# Patient Record
Sex: Female | Born: 1965 | Race: White | Hispanic: No | Marital: Married | Smoking: Never smoker
Health system: Southern US, Community
[De-identification: ages and names within clinical notes are randomized; demographics above are authoritative.]

## PROBLEM LIST (undated history)

## (undated) DIAGNOSIS — F329 Major depressive disorder, single episode, unspecified: Secondary | ICD-10-CM

## (undated) DIAGNOSIS — T7840XA Allergy, unspecified, initial encounter: Secondary | ICD-10-CM

## (undated) DIAGNOSIS — F32A Depression, unspecified: Secondary | ICD-10-CM

## (undated) DIAGNOSIS — R32 Unspecified urinary incontinence: Secondary | ICD-10-CM

## (undated) DIAGNOSIS — I10 Essential (primary) hypertension: Secondary | ICD-10-CM

## (undated) DIAGNOSIS — N390 Urinary tract infection, site not specified: Secondary | ICD-10-CM

## (undated) DIAGNOSIS — B019 Varicella without complication: Secondary | ICD-10-CM

## (undated) HISTORY — DX: Unspecified urinary incontinence: R32

## (undated) HISTORY — PX: ABLATION: SHX5711

## (undated) HISTORY — PX: OTHER SURGICAL HISTORY: SHX169

## (undated) HISTORY — PX: REDUCTION MAMMAPLASTY: SUR839

## (undated) HISTORY — DX: Allergy, unspecified, initial encounter: T78.40XA

## (undated) HISTORY — PX: MASTECTOMY: SHX3

## (undated) HISTORY — DX: Varicella without complication: B01.9

## (undated) HISTORY — DX: Depression, unspecified: F32.A

## (undated) HISTORY — PX: CHOLECYSTECTOMY: SHX55

## (undated) HISTORY — DX: Major depressive disorder, single episode, unspecified: F32.9

## (undated) HISTORY — DX: Urinary tract infection, site not specified: N39.0

---

## 2008-02-06 ENCOUNTER — Ambulatory Visit: Payer: Self-pay | Admitting: Obstetrics and Gynecology

## 2008-02-14 ENCOUNTER — Ambulatory Visit: Payer: Self-pay | Admitting: Obstetrics and Gynecology

## 2008-05-26 ENCOUNTER — Ambulatory Visit: Payer: Self-pay | Admitting: Internal Medicine

## 2009-05-27 ENCOUNTER — Ambulatory Visit: Payer: Self-pay | Admitting: Obstetrics and Gynecology

## 2009-08-27 ENCOUNTER — Ambulatory Visit: Payer: Self-pay | Admitting: Otolaryngology

## 2010-05-24 ENCOUNTER — Ambulatory Visit: Payer: Self-pay | Admitting: Obstetrics and Gynecology

## 2010-12-25 HISTORY — PX: CERVICAL FUSION: SHX112

## 2011-09-05 ENCOUNTER — Ambulatory Visit: Payer: Self-pay | Admitting: Family Medicine

## 2012-08-30 ENCOUNTER — Ambulatory Visit: Payer: Self-pay | Admitting: Physical Medicine and Rehabilitation

## 2012-10-17 ENCOUNTER — Ambulatory Visit: Payer: Self-pay | Admitting: Family Medicine

## 2012-10-17 LAB — URINALYSIS, COMPLETE
Blood: NEGATIVE
Glucose,UR: NEGATIVE mg/dL (ref 0–75)
Ketone: NEGATIVE
Nitrite: NEGATIVE
Ph: 6 (ref 4.5–8.0)
Protein: NEGATIVE

## 2012-10-18 LAB — URINE CULTURE

## 2012-10-22 ENCOUNTER — Ambulatory Visit: Payer: Self-pay | Admitting: Family Medicine

## 2012-12-04 ENCOUNTER — Ambulatory Visit: Payer: Self-pay | Admitting: Obstetrics and Gynecology

## 2013-03-03 ENCOUNTER — Ambulatory Visit: Payer: Self-pay

## 2013-03-03 ENCOUNTER — Encounter: Payer: Self-pay | Admitting: Adult Health

## 2013-03-03 ENCOUNTER — Ambulatory Visit (INDEPENDENT_AMBULATORY_CARE_PROVIDER_SITE_OTHER): Payer: BC Managed Care – PPO | Admitting: Adult Health

## 2013-03-03 VITALS — BP 142/95 | HR 95 | Temp 98.5°F | Resp 14 | Ht 66.0 in | Wt 180.0 lb

## 2013-03-03 DIAGNOSIS — I499 Cardiac arrhythmia, unspecified: Secondary | ICD-10-CM | POA: Insufficient documentation

## 2013-03-03 DIAGNOSIS — R5383 Other fatigue: Secondary | ICD-10-CM

## 2013-03-03 DIAGNOSIS — Z0000 Encounter for general adult medical examination without abnormal findings: Secondary | ICD-10-CM | POA: Insufficient documentation

## 2013-03-03 DIAGNOSIS — R9389 Abnormal findings on diagnostic imaging of other specified body structures: Secondary | ICD-10-CM | POA: Insufficient documentation

## 2013-03-03 DIAGNOSIS — R5381 Other malaise: Secondary | ICD-10-CM

## 2013-03-03 DIAGNOSIS — G479 Sleep disorder, unspecified: Secondary | ICD-10-CM | POA: Insufficient documentation

## 2013-03-03 DIAGNOSIS — R918 Other nonspecific abnormal finding of lung field: Secondary | ICD-10-CM

## 2013-03-03 DIAGNOSIS — Z8639 Personal history of other endocrine, nutritional and metabolic disease: Secondary | ICD-10-CM

## 2013-03-03 HISTORY — DX: Sleep disorder, unspecified: G47.9

## 2013-03-03 HISTORY — DX: Encounter for general adult medical examination without abnormal findings: Z00.00

## 2013-03-03 HISTORY — DX: Abnormal findings on diagnostic imaging of other specified body structures: R93.89

## 2013-03-03 HISTORY — DX: Cardiac arrhythmia, unspecified: I49.9

## 2013-03-03 LAB — LIPID PANEL
HDL: 44.7 mg/dL (ref >39.00)
Triglycerides: 115 mg/dL (ref 0.0–149.0)
VLDL: 23 mg/dL (ref 0.0–40.0)

## 2013-03-03 LAB — CBC WITH DIFFERENTIAL/PLATELET
Basophils Absolute: 0.1 10*3/uL (ref 0.0–0.1)
Basophils Relative: 1.3 % (ref 0.0–3.0)
Eosinophils Absolute: 0.1 10*3/uL (ref 0.0–0.7)
HCT: 40.1 % (ref 36.0–46.0)
Hemoglobin: 13.5 g/dL (ref 12.0–15.0)
Lymphs Abs: 1.3 10*3/uL (ref 0.7–4.0)
MCHC: 33.7 g/dL (ref 30.0–36.0)
MCV: 87.5 fl (ref 78.0–100.0)
Neutro Abs: 2.6 10*3/uL (ref 1.4–7.7)
RBC: 4.58 Mil/uL (ref 3.87–5.11)
RDW: 13.2 % (ref 11.5–14.6)

## 2013-03-03 LAB — COMPREHENSIVE METABOLIC PANEL
ALT: 18 U/L (ref 0–35)
AST: 22 U/L (ref 0–37)
BUN: 12 mg/dL (ref 6–23)
Calcium: 9.5 mg/dL (ref 8.4–10.5)
Chloride: 109 mEq/L (ref 96–112)
Creatinine, Ser: 0.6 mg/dL (ref 0.4–1.2)
GFR: 110.99 mL/min (ref >60.00)
Total Bilirubin: 0.6 mg/dL (ref 0.3–1.2)

## 2013-03-03 MED ORDER — ZOLPIDEM TARTRATE 10 MG PO TABS: 10.0000 mg | ORAL_TABLET | Freq: Every evening | ORAL | Status: DC | PRN

## 2013-03-03 NOTE — Progress Notes (Signed)
Subjective:    Patient ID: Kayla Hood, female    DOB: 1966-11-26, 47 y.o.   MRN: [id number]  HPI  Patient is a pleasant 47 y/o female who presents to clinic this morning to establish care. She was previously followed at [facility] in [place]. She is doing relatively well but has a few concerns today as follows:  1) Patient reports having an abnormal chest xray in November, December 2013. She states that she was treated for pneumonia and her symptoms did subside; however, she did not have a follow up chest xray. She brings the report with her today which shows that there was an abnormality in left perihilar region and infrahilar region posteriorly. This was back in 11/2012. Repeat chest xray was recommended but she has not had one ordered.  2) She feels that her heart is "skipping beats". She reports that this was evaluated back in April or May 2013 and was told that she could be sent to a Cardiologist but that "all that they would do is put you on medication". Pt feels that she was, in essence, told to "live with it". Her symptoms have become more frequent. She is reporting episodes daily. They are associated with feeling chest discomfort, lightheadedness and fatigue. She denies shortness of breath. She reports that her husband tells her that she snores.  3) Patient reports that she has been on ambien for some time now for sleep disturbance. She tried to wean herself off the medication but is still not able to sleep. She would like to resume the [place].   Health Maintenance:  PAP - November 2013 - Normal  Mammogram - January 2014 - Normal  Tetanus - last 10 years - needs Tdap  Labs: - None in ~ 2-3 years  Dental Exam - Yearly (2013)  Vision Exam - Every 2 years. (2012)  Exercise - patient does not exercise  Diet - she follows a healthy diet with fruits, vegetables, lean proteins   Review of Systems  Constitutional: Positive for fatigue. Negative for fever, chills and appetite change.   HENT: Negative.   Eyes: Negative.   Respiratory: Negative for cough, shortness of breath and wheezing.        Husband tells her that she snores  Cardiovascular: Positive for chest pain. Negative for leg swelling.       Skipped beats  Gastrointestinal: Negative.   Endocrine: Negative.   Genitourinary: Positive for dyspareunia. Negative for frequency, hematuria, flank pain, vaginal bleeding, vaginal discharge, difficulty urinating and genital sores.       Vaginal dryness causes dyspaurenia  Musculoskeletal: Negative for joint swelling.       Hx of cortisone injection for left hip bursitis. Pt is s/p cervical spinal sgy 08/2011  Skin: Negative.   Allergic/Immunologic: Positive for environmental allergies.       Seasonal allergies - takes benadryl prn during high allergy seasons.  Neurological: Positive for light-headedness. Negative for dizziness, tremors, syncope, numbness and headaches.  Hematological: Negative.   Psychiatric/Behavioral: Positive for sleep disturbance. Negative for suicidal ideas, behavioral problems and self-injury. The patient is not nervous/anxious.     BP 142/95  Pulse 95  Temp(Src) 98.5 F (36.9 C) (Oral)  Resp 14  Ht 5\' 6"  (1.676 m)  Wt 180 lb (81.647 kg)  BMI 29.07 kg/m2  SpO2 100%     Objective:   Physical Exam  Constitutional: She is oriented to person, place, and time. She appears well-developed and well-nourished. No distress.  HENT:  Head: Normocephalic and  atraumatic.  Right Ear: External ear normal.  Left Ear: External ear normal.  Nose: Nose normal.  Mouth/Throat: Oropharynx is clear and moist.  Eyes: Conjunctivae and EOM are normal. Pupils are equal, round, and reactive to light. No scleral icterus.  Neck: Normal range of motion. Neck supple. No tracheal deviation present. No thyromegaly present.  Cardiovascular: Normal rate, normal heart sounds and intact distal pulses.  Exam reveals no gallop.   No murmur heard. Regular rhythm with  several skipped beats noted.  Pulmonary/Chest: Effort normal and breath sounds normal. No respiratory distress. She has no wheezes. She has no rales. She exhibits no tenderness.  Abdominal: Soft. Bowel sounds are normal. She exhibits no distension and no mass. There is no tenderness. There is no rebound and no guarding.  Genitourinary:  Performed by OB-GYN (2013)  Musculoskeletal: Normal range of motion. She exhibits no edema and no tenderness.  Lymphadenopathy:    She has no cervical adenopathy.  Neurological: She is alert and oriented to person, place, and time. She has normal reflexes. No cranial nerve deficit. She exhibits normal muscle tone. Coordination normal.  Skin: Skin is warm and dry. No rash noted. No erythema.  Psychiatric: She has a normal mood and affect. Her behavior is normal. Judgment and thought content normal.        Assessment & Plan:

## 2013-03-03 NOTE — Assessment & Plan Note (Signed)
Patient had abnormal chest xray in November & December 2013. She reports that, at the time, she was having increased coughing and was treated for pneumonia. No further follow up was done. I will send patient for chest xray. If abnormality persists, will order a chest CT. Remote tobacco use for approximately 7 years in her early 92s.

## 2013-03-03 NOTE — Assessment & Plan Note (Signed)
Normal physical exam except for problems listed below. Labs ordered: cbc w/diff, cmet, lipid, TSH, vit D level (hx of deficiency). Also ordered EKG, chest xray to follow up on previously noted abnormality. Cardiology evaluation.

## 2013-03-03 NOTE — Assessment & Plan Note (Signed)
Patient has been taking ambien for some time now. She tried to wean herself off the medication but reports inability to sleep. Continue ambien at bedtime

## 2013-03-03 NOTE — Assessment & Plan Note (Addendum)
Patient feeling skipped beats which are increasing in frequency. Associated with feeling lightheaded, fatigued. Will check metabolic panel, TSH. Patient also reports that her husband says she snores. I am ordering a sleep study to r/o OSA which may cause irregular heart beat. EKG was done and shows a minimal ST depression in III and aVF. Ordered cardiology evaluation.

## 2013-03-03 NOTE — Patient Instructions (Addendum)
  Thank you for choosing [facility] for your health care needs.  Please have your labs drawn prior to leaving the office.  Please go to our [facility] office for your chest xray.  I have ordering a sleep study to evaluate for sleep apnea given your history of snoring and reports of irregular heartbeat.  I am also referring you to Cardiology - Dr. Mariah Milling or Dr. Kirke Corin. Our office will contact you with an appointment.  I have sent your prescriptions to [facility] in [place].  Remember to activate your MyChart for easy access to your medical records. Below is information on how do activate your account and your activation code.  Once your results are available we will let you know.

## 2013-03-04 LAB — VITAMIN D 25 HYDROXY (VIT D DEFICIENCY, FRACTURES): Vit D, 25-Hydroxy: 34 ng/mL (ref 30–89)

## 2013-03-04 LAB — TSH: TSH: 0.71 u[IU]/mL (ref 0.35–5.50)

## 2013-03-05 ENCOUNTER — Telehealth: Payer: Self-pay | Admitting: Adult Health

## 2013-03-05 ENCOUNTER — Other Ambulatory Visit: Payer: Self-pay | Admitting: Adult Health

## 2013-03-05 ENCOUNTER — Encounter: Payer: Self-pay | Admitting: Adult Health

## 2013-03-05 DIAGNOSIS — E785 Hyperlipidemia, unspecified: Secondary | ICD-10-CM

## 2013-03-05 MED ORDER — ATORVASTATIN CALCIUM 10 MG PO TABS: 10.0000 mg | ORAL_TABLET | Freq: Every day | ORAL | Status: DC

## 2013-03-05 NOTE — Telephone Encounter (Signed)
Chest x-ray normal

## 2013-03-21 ENCOUNTER — Encounter: Payer: Self-pay | Admitting: Adult Health

## 2013-03-21 ENCOUNTER — Encounter: Payer: Self-pay | Admitting: Cardiovascular Disease

## 2013-03-21 ENCOUNTER — Ambulatory Visit (INDEPENDENT_AMBULATORY_CARE_PROVIDER_SITE_OTHER): Payer: BC Managed Care – PPO | Admitting: Cardiovascular Disease

## 2013-03-21 VITALS — BP 155/102 | HR 92 | Ht 67.0 in | Wt 180.2 lb

## 2013-03-21 DIAGNOSIS — R002 Palpitations: Secondary | ICD-10-CM

## 2013-03-21 DIAGNOSIS — I499 Cardiac arrhythmia, unspecified: Secondary | ICD-10-CM

## 2013-03-21 DIAGNOSIS — R0789 Other chest pain: Secondary | ICD-10-CM

## 2013-03-21 HISTORY — DX: Palpitations: R00.2

## 2013-03-21 MED ORDER — METOPROLOL TARTRATE 25 MG PO TABS: 12.5000 mg | ORAL_TABLET | Freq: Two times a day (BID) | ORAL | Status: DC

## 2013-03-21 NOTE — Patient Instructions (Addendum)
Start Metoprolol 25 mg take 1/2 tablet twice daily.   Your physician has requested that you have an echocardiogram. Echocardiography is a painless test that uses sound waves to create images of your heart. It provides your doctor with information about the size and shape of your heart and how well your heart's chambers and valves are working. This procedure takes approximately one hour. There are no restrictions for this procedure.  Follow up in 1 month.

## 2013-03-21 NOTE — Progress Notes (Signed)
HPI  This is a pleasant 47 year old female who was referred by Kayla Hood for evaluation of palpitations. She reports chronic symptoms of skipping in the heart associated with mild chest discomfort and dizziness. This has been intermittent and does not seem to have a specific pattern. It happens at rest and gets better with physical activities. She is highly aware of these skipped beats and becomes very anxious . She was evaluated [facility] cardiology clinic in [place]. She had a Holter monitor done and was told that she had premature beats.  She feels that her symptoms are getting worse. She had recent labs which were normal including thyroid function. She drinks 2 cups of coffee daily. There is no family history of arrhythmia or sudden death. Her father had CABG at the age of 39.   Allergies  Allergen Reactions  . Phenergan (Promethazine Hcl)      Current Outpatient Prescriptions on File Prior to Visit  Medication Sig Dispense Refill  . sertraline (ZOLOFT) 50 MG tablet Take 50 mg by mouth daily.       No current facility-administered medications on file prior to visit.     Past Medical History  Diagnosis Date  . Depression   . Chicken pox   . Allergy   . Incontinence of urine   . UTI (lower urinary tract infection)      Past Surgical History  Procedure Laterality Date  . Cervical fusion  2012  . Pelvic laproscopic surgeries      4 prior to 2008     Family History  Problem Relation Age of Onset  . Hyperlipidemia Mother   . Heart disease Father 10    CAD  . Hypertension Father   . Hypertension Brother   . Cancer Maternal Grandfather 44    Stomach Cancer  . Cancer Paternal Grandmother 32    Uterine Cancer  . Stroke Paternal Grandfather 68    CVA     History   Social History  . Marital Status: Married    Spouse Name: Elky Funches    Number of Children: 1  . Years of Education: 14   Occupational History  . Preschool Teacher for [employer]    Social  History Main Topics  . Smoking status: Former Smoker -- 0.25 packs/day for 5 years    Types: Cigarettes    Quit date: 03/21/2000  . Smokeless tobacco: Never Used  . Alcohol Use: 1.2 oz/week    0 Glasses of wine, 2 Cans of beer per week     Comment: Occassionally   . Drug Use: No  . Sexually Active: Yes   Other Topics Concern  . Not on file   Social History Narrative  . No narrative on file     ROS Constitutional: Negative for fever, chills, diaphoresis, activity change, appetite change and fatigue.  HENT: Negative for hearing loss, nosebleeds, congestion, sore throat, facial swelling, drooling, trouble swallowing, neck pain, voice change, sinus pressure and tinnitus.  Eyes: Negative for photophobia, pain, discharge and visual disturbance.  Respiratory: Negative for apnea, cough and wheezing.  Cardiovascular: Negative for  leg swelling.  Gastrointestinal: Negative for nausea, vomiting, abdominal pain, diarrhea, constipation, blood in stool and abdominal distention.  Genitourinary: Negative for dysuria, urgency, frequency, hematuria and decreased urine volume.  Musculoskeletal: Negative for myalgias, back pain, joint swelling, arthralgias and gait problem.  Skin: Negative for color change, pallor, rash and wound.  Neurological: Negative for dizziness, tremors, seizures, syncope, speech difficulty, weakness, light-headedness,  numbness and headaches.  Psychiatric/Behavioral: Negative for suicidal ideas, hallucinations, behavioral problems and agitation. The patient is not nervous/anxious.     PHYSICAL EXAM   BP 155/102  Pulse 92  Ht 5\' 7"  (1.702 m)  Wt 180 lb 4 oz (81.761 kg)  BMI 28.22 kg/m2 Constitutional: She is oriented to person, place, and time. She appears well-developed and well-nourished. No distress.  HENT: No nasal discharge.  Head: Normocephalic and atraumatic.  Eyes: Pupils are equal and round. Right eye exhibits no discharge. Left eye exhibits no discharge.    Neck: Normal range of motion. Neck supple. No JVD present. No thyromegaly present.  Cardiovascular: Normal rate, regular rhythm, normal heart sounds. Exam reveals no gallop and no friction rub. No murmur heard.  Pulmonary/Chest: Effort normal and breath sounds normal. No stridor. No respiratory distress. She has no wheezes. She has no rales. She exhibits no tenderness.  Abdominal: Soft. Bowel sounds are normal. She exhibits no distension. There is no tenderness. There is no rebound and no guarding.  Musculoskeletal: Normal range of motion. She exhibits no edema and no tenderness.  Neurological: She is alert and oriented to person, place, and time. Coordination normal.  Skin: Skin is warm and dry. No rash noted. She is not diaphoretic. No erythema. No pallor.  Psychiatric: She has a normal mood and affect. Her behavior is normal. Judgment and thought content normal.     EXB:MWUXL  Rhythm  -Right atrial enlargement.   -Nonspecific ST depression  -Nondiagnostic.   ABNORMAL    ASSESSMENT AND PLAN

## 2013-03-21 NOTE — Assessment & Plan Note (Signed)
The patient has frequent palpitations due to what seems to be premature beats. She feels that the symptoms are getting worse. She had a Holter monitor done last year. Recent labs were unremarkable. Due to worsening symptoms, I will obtain an echocardiogram to make sure she has no structural heart abnormalities especially that her ECG is slightly abnormal with minor ST depression in the inferior leads. I reassured her that this condition is not life-threatening but she seems to be very frustrated with her symptoms. I explained to her that the goal of treatment is symptomatic relief. Thus, I will start on small dose metoprolol 12.5 mg twice daily. This hopefully will help also with high blood pressure which seems to be reactive.

## 2013-03-24 ENCOUNTER — Encounter: Payer: Self-pay | Admitting: Adult Health

## 2013-03-31 ENCOUNTER — Encounter: Payer: Self-pay | Admitting: Cardiovascular Disease

## 2013-04-10 ENCOUNTER — Other Ambulatory Visit: Payer: BC Managed Care – PPO

## 2013-04-14 ENCOUNTER — Encounter: Payer: Self-pay | Admitting: *Deleted

## 2013-04-17 ENCOUNTER — Other Ambulatory Visit (INDEPENDENT_AMBULATORY_CARE_PROVIDER_SITE_OTHER): Payer: BC Managed Care – PPO

## 2013-04-17 ENCOUNTER — Other Ambulatory Visit: Payer: Self-pay

## 2013-04-17 DIAGNOSIS — R002 Palpitations: Secondary | ICD-10-CM

## 2013-04-17 DIAGNOSIS — R0789 Other chest pain: Secondary | ICD-10-CM

## 2013-04-17 DIAGNOSIS — I499 Cardiac arrhythmia, unspecified: Secondary | ICD-10-CM

## 2013-04-21 ENCOUNTER — Ambulatory Visit: Payer: BC Managed Care – PPO | Admitting: Cardiovascular Disease

## 2013-04-25 NOTE — Progress Notes (Signed)
Informed pt of echo results

## 2013-05-11 ENCOUNTER — Encounter: Payer: Self-pay | Admitting: Adult Health

## 2013-05-21 ENCOUNTER — Encounter: Payer: Self-pay | Admitting: Adult Health

## 2013-05-23 ENCOUNTER — Encounter: Payer: Self-pay | Admitting: Adult Health

## 2013-06-26 ENCOUNTER — Ambulatory Visit (INDEPENDENT_AMBULATORY_CARE_PROVIDER_SITE_OTHER): Payer: BC Managed Care – PPO | Admitting: Adult Health

## 2013-06-26 ENCOUNTER — Ambulatory Visit (INDEPENDENT_AMBULATORY_CARE_PROVIDER_SITE_OTHER)
Admission: RE | Admit: 2013-06-26 | Discharge: 2013-06-26 | Disposition: A | Discharge status: Home / Self Care | Payer: BC Managed Care – PPO | Source: Ambulatory Visit | Attending: Adult Health | Admitting: Adult Health

## 2013-06-26 ENCOUNTER — Encounter: Payer: Self-pay | Admitting: Adult Health

## 2013-06-26 VITALS — BP 128/76 | HR 93 | Temp 98.9°F | Resp 12 | Wt 176.5 lb

## 2013-06-26 DIAGNOSIS — M25559 Pain in unspecified hip: Secondary | ICD-10-CM

## 2013-06-26 DIAGNOSIS — M25552 Pain in left hip: Secondary | ICD-10-CM

## 2013-06-26 MED ORDER — MELOXICAM 7.5 MG PO TABS: 7.5000 mg | ORAL_TABLET | Freq: Every day | ORAL | Status: DC

## 2013-06-26 NOTE — Progress Notes (Signed)
  Subjective:    Patient ID: Kayla Hood, female    DOB: Dec 07, 1966, 47 y.o.   MRN: [id number]  HPI  Patient is a pleasant 47 year old female who presents to clinic with left hip pain. The pain has been ongoing for greater than one month. 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 Patient reports having a history of bursitis in the left trochanter; however, this pain feels slightly different. Her pain improves with rest.   Current Outpatient Prescriptions on File Prior to Visit  Medication Sig Dispense Refill  . ibuprofen (ADVIL,MOTRIN) 600 MG tablet Take 600 mg by mouth every 6 (six) hours as needed for pain.      . Multiple Vitamin (MULTIVITAMIN) tablet Take 1 tablet by mouth daily.      . sertraline (ZOLOFT) 50 MG tablet Take 50 mg by mouth daily.      Marland Kitchen zolpidem (AMBIEN) 10 MG tablet Take 5 mg by mouth at bedtime.       No current facility-administered medications on file prior to visit.    Review of Systems  Constitutional: Negative.   Musculoskeletal: Negative for back pain and joint swelling.       Left anterior hip pain  Neurological: Negative for numbness.       Objective:   Physical Exam  Constitutional: She is oriented to person, place, and time. She appears well-developed and well-nourished. No distress.  Cardiovascular: Normal rate and regular rhythm.   Pulmonary/Chest: Effort normal. No respiratory distress.  Musculoskeletal: Normal range of motion. She exhibits tenderness. She exhibits no edema.  There is no point tenderness at the greater trochanter. Patient has good range of motion with abduction and adduction of the hip. There is point tenderness over left anterior hip  Neurological: She is alert and oriented to person, place, and time. Coordination normal.  Psychiatric: She has a normal mood and affect. Her behavior is normal. Judgment and thought content normal.          Assessment & Plan:

## 2013-06-28 ENCOUNTER — Encounter: Payer: Self-pay | Admitting: Adult Health

## 2013-06-28 DIAGNOSIS — M25552 Pain in left hip: Secondary | ICD-10-CM

## 2013-06-28 HISTORY — DX: Pain in left hip: M25.552

## 2013-06-28 NOTE — Assessment & Plan Note (Addendum)
Suspect this is tendinitis involving the iliopsoas tendon. Send for left hip x-ray to rule out any structural abnormalities. Start anti-inflammatories such as ibuprofen 600 mg every 6 hours for the next 3-4 days, ice for 15 minutes 3-4 times a day for the next 3-4 days and rest. Once inflammation subsides recommendation is to resume gradual activity with strengthening of the area such warmup, stretching, or exercises etc.

## 2013-07-12 ENCOUNTER — Ambulatory Visit: Payer: Self-pay | Admitting: Family Medicine

## 2013-07-12 LAB — URINALYSIS, COMPLETE
Bacteria: NEGATIVE
Bilirubin,UR: NEGATIVE
Glucose,UR: NEGATIVE mg/dL (ref 0–75)
Ketone: NEGATIVE
Nitrite: NEGATIVE
Ph: 6.5 (ref 4.5–8.0)
Protein: NEGATIVE
Specific Gravity: 1.01 (ref 1.003–1.030)
WBC UR: NONE SEEN /HPF (ref 0–5)

## 2013-07-12 LAB — PREGNANCY, URINE: Pregnancy Test, Urine: NEGATIVE m[IU]/mL

## 2013-08-27 ENCOUNTER — Ambulatory Visit: Payer: BC Managed Care – PPO | Admitting: Adult Health

## 2013-08-27 ENCOUNTER — Ambulatory Visit: Payer: BC Managed Care – PPO | Admitting: Internal Medicine

## 2013-09-01 ENCOUNTER — Other Ambulatory Visit: Payer: Self-pay | Admitting: Adult Health

## 2013-09-01 ENCOUNTER — Ambulatory Visit (INDEPENDENT_AMBULATORY_CARE_PROVIDER_SITE_OTHER): Payer: BC Managed Care – PPO | Admitting: Adult Health

## 2013-09-01 ENCOUNTER — Ambulatory Visit (INDEPENDENT_AMBULATORY_CARE_PROVIDER_SITE_OTHER)
Admission: RE | Admit: 2013-09-01 | Discharge: 2013-09-01 | Disposition: A | Discharge status: Home / Self Care | Payer: BC Managed Care – PPO | Source: Ambulatory Visit | Attending: Adult Health | Admitting: Adult Health

## 2013-09-01 ENCOUNTER — Encounter: Payer: Self-pay | Admitting: Adult Health

## 2013-09-01 VITALS — BP 130/82 | HR 78 | Temp 98.7°F | Resp 12 | Ht 67.0 in | Wt 180.0 lb

## 2013-09-01 DIAGNOSIS — R52 Pain, unspecified: Secondary | ICD-10-CM

## 2013-09-01 DIAGNOSIS — M25512 Pain in left shoulder: Secondary | ICD-10-CM

## 2013-09-01 DIAGNOSIS — M778 Other enthesopathies, not elsewhere classified: Secondary | ICD-10-CM

## 2013-09-01 DIAGNOSIS — M67919 Unspecified disorder of synovium and tendon, unspecified shoulder: Secondary | ICD-10-CM

## 2013-09-01 HISTORY — DX: Other enthesopathies, not elsewhere classified: M77.8

## 2013-09-01 MED ORDER — SERTRALINE HCL 50 MG PO TABS: 50.0000 mg | ORAL_TABLET | Freq: Every day | ORAL | Status: DC

## 2013-09-01 MED ORDER — ZOLPIDEM TARTRATE 10 MG PO TABS: 5.0000 mg | ORAL_TABLET | Freq: Every day | ORAL | Status: DC

## 2013-09-01 NOTE — Progress Notes (Signed)
  Subjective:    Patient ID: Kayla Hood, female    DOB: [birth year], 47 y.o.   MRN: [id number]  HPI  Patient presents left shoulder, arm pain. First injured area in July while gardening. She lost her balance and moved in the wrong way to prevent a fall when she felt a pulling sensation in the left arm. Now she is also feeling some numbness and tingling which appears to be positional. She has been slowly exercising the area and preventing it from stiffening. She has some decreased ROM in the left shoulder joint. Meygan has been taking ibuprofen with some relief. Her pain is not constant. It occurs with mobility. She has not iced the area nor applied any heat.   Current Outpatient Prescriptions on File Prior to Visit  Medication Sig Dispense Refill  . metoprolol tartrate (LOPRESSOR) 25 MG tablet Take 25 mg by mouth as needed.       No current facility-administered medications on file prior to visit.    Review of Systems  Constitutional: Negative.   HENT: Negative for neck pain.   Respiratory: Negative.   Cardiovascular: Negative.   Musculoskeletal:       Left shoulder and arm pain with movement s/p injury during gardening.  Neurological: Positive for numbness.       Numbness and tingling of fingers on left hand with movement of arm   BP 130/82  Pulse 78  Temp(Src) 98.7 F (37.1 C) (Oral)  Resp 12  Ht 5\' 7"  (1.702 m)  Wt 180 lb (81.647 kg)  BMI 28.19 kg/m2  SpO2 98%     Objective:   Physical Exam  Constitutional: She is oriented to person, place, and time. She appears well-developed and well-nourished. No distress.  Cardiovascular: Normal rate and regular rhythm.   Pulmonary/Chest: Effort normal. No respiratory distress.  Musculoskeletal: She exhibits tenderness. She exhibits no edema.  Decreased ROM of left shoulder. No crepitus. Pain exhibited with palpation of bicep brachii. Pain exhibited with abduction of arm.  Neurological: She is alert and oriented to person, place, and  time. Coordination normal.  Skin: Skin is warm and dry.  Psychiatric: She has a normal mood and affect. Her behavior is normal. Judgment and thought content normal.       Assessment & Plan:

## 2013-09-01 NOTE — Patient Instructions (Addendum)
   Apply ice alternating with heat to the affected areas for 15 min at a time. Do this approximately 3-4 times daily.  Immobilize the area for rest. You can use a sling.  I am sending you for an xray of the shoulder and arm. Once I get the results I will send them through MyChart.  I have also [facility] in [facility] [place]. I will give you are prescription for ambien to take to your pharmacy.  Return to clinic if your symptoms are not improving within 4 weeks or sooner if your symptoms worsen.

## 2013-09-01 NOTE — Assessment & Plan Note (Addendum)
Symptoms are consistent with tendonitis of the left suprapinatus tendon. She exhibits pain with abduction of the arm. Xray left shoulder and humerus to r/o any bony abnormality. If normal will refer to ortho. Apply ice 3-4 times daily. May immobilize during the night with a sling since she reports pain with movement. Continue anti-inflammatory medication such as ibuprofen.

## 2013-09-02 ENCOUNTER — Telehealth: Payer: Self-pay | Admitting: Emergency Medicine

## 2013-09-10 ENCOUNTER — Encounter: Payer: Self-pay | Admitting: Family Medicine

## 2013-09-10 ENCOUNTER — Ambulatory Visit (INDEPENDENT_AMBULATORY_CARE_PROVIDER_SITE_OTHER): Payer: BC Managed Care – PPO | Admitting: Family Medicine

## 2013-09-10 VITALS — BP 148/90 | HR 73 | Temp 98.7°F | Ht 67.0 in | Wt 180.2 lb

## 2013-09-10 DIAGNOSIS — M7502 Adhesive capsulitis of left shoulder: Secondary | ICD-10-CM

## 2013-09-10 DIAGNOSIS — M7500 Adhesive capsulitis of unspecified shoulder: Secondary | ICD-10-CM

## 2013-09-10 DIAGNOSIS — M67919 Unspecified disorder of synovium and tendon, unspecified shoulder: Secondary | ICD-10-CM

## 2013-09-10 DIAGNOSIS — M7582 Other shoulder lesions, left shoulder: Secondary | ICD-10-CM

## 2013-09-10 NOTE — Progress Notes (Signed)
Date:  09/10/2013   Name:  Kayla Hood   DOB:  [birth year]   MRN:  [id number] Gender: female Age: 47 y.o.  Primary Physician: Orville Govern, NP  Dear Dr. Hassie Bruce,  Thank you for having me see Kayla Hood in consultation today at [facility] at [facility] for her problem with LEFT shoulder pain.  As you may recall, she is a 47 y.o. year old female with a history of LEFT shoulder pain and a history of cervical spine fusion in 2012 who injured her left shoulder in July while she was gardening. At that time, she lost her balance and as she was moving used her hand to brace her fall and felt some pain in her left shoulder. At that point, most of her pain was lateral. Now, she complains a lot of anterior and posterior pain.  This morning, she felt a lot of pain again in the left shoulder. She also has a dull lateral ache and pain with some numbness more in a t-shirt distribution on the left. No significant neck pain right now.   She has significant loss of motion, and she is not sure when that started.    Past Medical History  Diagnosis Date  . Depression   . Chicken pox   . Allergy   . Incontinence of urine   . UTI (lower urinary tract infection)     Past Surgical History  Procedure Laterality Date  . Cervical fusion  2012  . Pelvic laproscopic surgeries      4 prior to 2008    History   Social History  . Marital Status: Married    Spouse Name: Sherre Wooton    Number of Children: 1  . Years of Education: 14   Occupational History  . Preschool Teacher for [employer]    Social History Main Topics  . Smoking status: Former Smoker -- 0.25 packs/day for 5 years    Types: Cigarettes    Quit date: 03/21/2000  . Smokeless tobacco: Never Used  . Alcohol Use: 1.2 oz/week    0 Glasses of wine, 2 Cans of beer per week     Comment: Occassionally   . Drug Use: No  . Sexual Activity: Yes   Other Topics Concern  . None   Social History Narrative  . None    Family  History  Problem Relation Age of Onset  . Hyperlipidemia Mother   . Heart disease Father 31    CAD  . Hypertension Father   . Hypertension Brother   . Cancer Maternal Grandfather 44    Stomach Cancer  . Cancer Paternal Grandmother 41    Uterine Cancer  . Stroke Paternal Grandfather 49    CVA    Medications and Allergies reviewed  Outpatient Prescriptions Prior to Visit  Medication Sig Dispense Refill  . metoprolol tartrate (LOPRESSOR) 25 MG tablet Take 25 mg by mouth as needed.      . sertraline (ZOLOFT) 50 MG tablet Take 1 tablet (50 mg total) by mouth daily.  30 tablet  6  . zolpidem (AMBIEN) 10 MG tablet Take 0.5 tablets (5 mg total) by mouth at bedtime.  30 tablet  5   No facility-administered medications prior to visit.    Review of Systems:    GEN: No fevers, chills. Nontoxic. Primarily MSK c/o today. MSK: Detailed in the HPI GI: tolerating PO intake without difficulty Neuro: detailed above Otherwise the pertinent positives of the ROS are noted above.  Physical Examination: Filed Vitals:   09/10/13 1121  BP: 148/90  Pulse: 73  Temp: 98.7 F (37.1 C)  TempSrc: Oral  Height: 5\' 7"  (1.702 m)  Weight: 180 lb 4 oz (81.761 kg)      GEN: Well-developed,well-nourished,in no acute distress; alert,appropriate and cooperative throughout examination HEENT: Normocephalic and atraumatic without obvious abnormalities. Ears, externally no deformities PULM: Breathing comfortably in no respiratory distress EXT: No clubbing, cyanosis, or edema PSYCH: Normally interactive. Cooperative during the interview. Pleasant. Friendly and conversant. Not anxious or depressed appearing. Normal, full affect.  Shoulder: L Inspection: No muscle wasting or winging Ecchymosis/edema: neg  AC joint, scapula, clavicle: NT Cervical spine: NT, full ROM Spurling's: neg Abduction: abd to 155, 5/5 Flexion: flex to 165, 5/5 IR, at 90 deg abd, loss of 60 deg compared to R side, lift-off:  5/5 ER at neutral: full, 5/5 AC crossover: neg Neer: mild pos Hawkins: mild pos Drop Test: neg Empty Can: neg Supraspinatus insertion: NT Bicipital groove: NT Speed's: neg Yergason's: neg Sulcus sign: neg Scapular dyskinesis: none C5-T1 intact  Neuro: Sensation intact Grip 5/5   Dg Shoulder Left  09/01/2013   *RADIOLOGY REPORT*  Clinical Data: Left shoulder pain, numbness and tingling in fingers  LEFT SHOULDER - 2+ VIEW  Comparison: None  Findings: Low normal osseous mineralization. AC joint alignment normal. No acute fracture, dislocation, or bone destruction. Visualized left ribs intact.  IMPRESSION: No acute osseous abnormalities.   Original Report Authenticated By: Ulyses Southward, M.D.  Independently reviewed, no significant oa or impingement anatomy.  Hannah Beat, MD 09/14/2013, 4:08 PM   Dg Humerus Left  09/01/2013   *RADIOLOGY REPORT*  Clinical Data:  Left shoulder pain  LEFT HUMERUS - 2+ VIEW  Comparison: None  Findings: AC joint alignment normal. Shoulder and elbow joint alignments grossly normal. No definite fracture, dislocation or bone destruction.  IMPRESSION: Normal exam.   Original Report Authenticated By: Ulyses Southward, M.D.    Assessment and Plan:  Impression: 1. Likely initial minor RTC partial tear in July, now with functional impingement and tendinopathy of RTC, but relatively minor. 2. 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   Recommendations: Aggressive PT and HEP. Intraarticular steroid injection. Given [facility] adhesive capsulitis program.  Intrarticular Shoulder Injection, LEFT Verbal consent was obtained from the patient. Risks including infection explained and contrasted with benefits and alternatives. Patient prepped with Chloraprep and Ethyl Chloride used for anesthesia. An intraarticular shoulder injection was performed using the posterior approach. The patient tolerated the procedure well and had decreased  pain post injection. No complications. Injection: 8 cc of Lidocaine 1% and 2 cc of Depo-Medrol 40 mg. Needle: 22 gauge   We will see the patient back in 6 weeks.  Thank you for having [place] see Kayla Hood in consultation.  Feel free to contact me with any questions.  Signed, Elpidio Galea. [name] Shankle, MD, CAQ Sports Medicine [employer] at [facility] [address] [place], [place] [address] Phone: [phone] Fax: [phone]

## 2013-10-20 ENCOUNTER — Ambulatory Visit: Payer: BC Managed Care – PPO | Admitting: Family Medicine

## 2013-10-23 ENCOUNTER — Encounter: Payer: Self-pay | Admitting: *Deleted

## 2013-10-24 ENCOUNTER — Ambulatory Visit: Payer: BC Managed Care – PPO | Admitting: Adult Health

## 2013-10-27 ENCOUNTER — Encounter: Payer: Self-pay | Admitting: Family Medicine

## 2013-10-27 ENCOUNTER — Ambulatory Visit (INDEPENDENT_AMBULATORY_CARE_PROVIDER_SITE_OTHER): Payer: BC Managed Care – PPO | Admitting: Family Medicine

## 2013-10-27 VITALS — BP 124/80 | HR 85 | Temp 98.4°F | Ht 67.0 in | Wt 182.0 lb

## 2013-10-27 DIAGNOSIS — M25519 Pain in unspecified shoulder: Secondary | ICD-10-CM

## 2013-10-27 DIAGNOSIS — M25512 Pain in left shoulder: Secondary | ICD-10-CM

## 2013-10-27 NOTE — Patient Instructions (Signed)
REFERRAL: GO THE THE FRONT ROOM AT THE ENTRANCE OF OUR CLINIC, NEAR CHECK IN. ASK FOR Kayla Hood. SHE WILL HELP YOU SET UP YOUR REFERRAL. DATE: TIME:  

## 2013-10-27 NOTE — Progress Notes (Signed)
Date:  10/27/2013   Name:  Kayla Hood   DOB:  Oct 19, 1966   MRN:  [id number] Gender: female Age: 47 y.o.  Primary Physician:  Orville Govern, NP   Chief Complaint: Follow-up   History of Present Illness:  Kayla Hood is a 47 y.o. very pleasant female patient who presents with the following:  F/u Left shoulder: The patient's last office visit we did a shoulder injection, intra-articular. She had some relief of pain and improved range of motion from about 2 weeks, she is some continued to have some significant loss of motion. She also feels pain and loss of strength in the point of abduction. She also has pain at nighttime and significant waking her up. She does have a history of injury on the left shoulder as described in July of 2014.  09/10/2013 As you may recall, she is a 47 y.o. year old female with a history of LEFT shoulder pain and a history of cervical spine fusion in 2012 who injured her left shoulder in July while she was gardening. At that time, she lost her balance and as she was moving used her hand to brace her fall and felt some pain in her left shoulder. At that point, most of her pain was lateral. Now, she complains a lot of anterior and posterior pain.  This morning, she felt a lot of pain again in the left shoulder. She also has a dull lateral ache and pain with some numbness more in a t-shirt distribution on the left. No significant neck pain right now.   She has significant loss of motion, and she is not sure when that started.     Past Medical History, Surgical History, Social History, Family History, Problem List, Medications, and Allergies have been reviewed and updated if relevant.  Review of Systems:  GEN: No fevers, chills. Nontoxic. Primarily MSK c/o today. MSK: Detailed in the HPI GI: tolerating PO intake without difficulty Neuro: No numbness, parasthesias, or tingling associated. Otherwise the pertinent positives of the ROS are noted above.    Physical  Examination: BP 124/80  Pulse 85  Temp(Src) 98.4 F (36.9 C) (Oral)  Ht 5\' 7"  (1.702 m)  Wt 182 lb (82.555 kg)  BMI 28.50 kg/m2   GEN: Well-developed,well-nourished,in no acute distress; alert,appropriate and cooperative throughout examination HEENT: Normocephalic and atraumatic without obvious abnormalities. Ears, externally no deformities PULM: Breathing comfortably in no respiratory distress EXT: No clubbing, cyanosis, or edema PSYCH: Normally interactive. Cooperative during the interview. Pleasant. Friendly and conversant. Not anxious or depressed appearing. Normal, full affect.  Shoulder: L Inspection: No muscle wasting or winging Ecchymosis/edema: neg  AC joint, scapula, clavicle: NT Cervical spine: NT, full ROM Spurling's: neg Abduction: abd to 160, 4+/5 Flexion: flex to 165, 5/5 IR, at 90 deg abd, loss of 60 deg compared to R side, lift-off: 5/5 ER at neutral: loss of 50 deg, 5/5 AC crossover: neg Neer: mild pos Hawkins: mild pos Drop Test: neg Empty Can: neg Supraspinatus insertion: NT Bicipital groove: NT Speed's: neg Yergason's: neg Sulcus sign: neg Scapular dyskinesis: none C5-T1 intact  Neuro: Sensation intact Grip 5/5   Dg Shoulder Left  09/01/2013   *RADIOLOGY REPORT*  Clinical Data: Left shoulder pain, numbness and tingling in fingers  LEFT SHOULDER - 2+ VIEW  Comparison: None  Findings: Low normal osseous mineralization. AC joint alignment normal. No acute fracture, dislocation, or bone destruction. Visualized left ribs intact.  IMPRESSION: No acute osseous abnormalities.   Original Report Authenticated By: Loraine Leriche  Tyron Russell, M.D.  Independently reviewed, no significant oa or impingement anatomy.  Hannah Beat, MD 09/14/2013, 4:08 PM   Dg Humerus Left  09/01/2013   *RADIOLOGY REPORT*  Clinical Data:  Left shoulder pain  LEFT HUMERUS - 2+ VIEW  Comparison: None  Findings: AC joint alignment normal. Shoulder and elbow joint alignments grossly normal. No  definite fracture, dislocation or bone destruction.  IMPRESSION: Normal exam.   Original Report Authenticated By: Ulyses Southward, M.D.    Assessment and Plan:  Left shoulder pain - Plan: MR Shoulder Left Wo Contrast  Minimal improvement over 6 weeks of dedicated home exercise program through [facility] is frozen shoulder program, and status post intra-articular injection times one and multiple weeks of NSAIDs.  Obtain an MRI of the left shoulder to evaluate to rule out potential rotator cuff tear. In a young patient, she may have sustained a rotator cuff tear in July at the time of her initial injury. This will help determine plan of care.  Patient Instructions  REFERRAL: GO THE THE FRONT ROOM AT THE ENTRANCE OF OUR CLINIC, NEAR CHECK IN. ASK FOR MARION. SHE WILL HELP YOU SET UP YOUR REFERRAL. DATE: TIME:    Orders Today:  Orders Placed This Encounter  Procedures  . MR Shoulder Left Wo Contrast    Standing Status: Future     Number of Occurrences:      Standing Expiration Date: 12/27/2014    Order Specific Question:  Reason for Exam (SYMPTOM  OR DIAGNOSIS REQUIRED)    Answer:  [facility] - OPEN MRI, Left shoulder, eval for RTC tear    Order Specific Question:  Is the patient pregnant?    Answer:  No    Order Specific Question:  Preferred imaging location?    Answer:  External    Order Specific Question:  Does the patient have a pacemaker, internal devices, implants, aneury    Answer:  No    New medications, updates to list, dose adjustments: No orders of the defined types were placed in this encounter.    Signed,  Elpidio Galea. Deloros Beretta, MD, CAQ Sports Medicine  [facility] at [facility] [address] [place] [address] Phone: [phone] Fax: [phone]  Updated Complete Medication List:   Medication List       This list is accurate as of: 10/27/13  4:24 PM.  Always use your most recent med list.               acetaminophen 325 MG tablet  Commonly known as:   TYLENOL  Take 650 mg by mouth every 6 (six) hours as needed for pain.     ibuprofen 200 MG tablet  Commonly known as:  ADVIL,MOTRIN  Take 600 mg by mouth every 6 (six) hours as needed for pain.     metoprolol tartrate 25 MG tablet  Commonly known as:  LOPRESSOR  Take 25 mg by mouth as needed.     sertraline 50 MG tablet  Commonly known as:  ZOLOFT  Take 1 tablet (50 mg total) by mouth daily.     zolpidem 10 MG tablet  Commonly known as:  AMBIEN  Take 0.5 tablets (5 mg total) by mouth at bedtime.

## 2013-10-30 ENCOUNTER — Other Ambulatory Visit: Payer: Self-pay

## 2013-11-05 ENCOUNTER — Ambulatory Visit: Payer: Self-pay | Admitting: Family Medicine

## 2013-11-06 ENCOUNTER — Telehealth: Payer: Self-pay

## 2013-11-06 ENCOUNTER — Encounter: Payer: Self-pay | Admitting: Family Medicine

## 2013-11-06 NOTE — Telephone Encounter (Signed)
1. Partial tear infraspinatus (part of rotator cuff) 2.  Small labral tear 3. Clinically, Frozen Shoulder Recommendation at this point would be orthopedic surgery referral:  Dr. Eulis Foster [facility]) or Dr. Ave Filter [facility])  Left message for patient to return my call.

## 2013-11-06 NOTE — Telephone Encounter (Signed)
Pt left v/m; pt had MRI done on 11/05/13 and hopes to hear about results before weekend.Please advise.

## 2013-11-06 NOTE — Telephone Encounter (Signed)
To Donna

## 2013-11-06 NOTE — Telephone Encounter (Signed)
Kayla Hood, can you help me track down this MRI. It is not in chart. May have to call [facility]

## 2013-11-06 NOTE — Telephone Encounter (Signed)
MRI results requested from [facility]. 

## 2013-11-06 NOTE — Telephone Encounter (Signed)
Sent again

## 2013-11-06 NOTE — Telephone Encounter (Signed)
MRI results requested from [facility].

## 2013-11-07 NOTE — Telephone Encounter (Signed)
Patient notified as instructed by telephone.  Would like to be referred to Dr. Juanell Fairly in [place].  Also would like a copy of her results mailed to her.  Will forward to Dr. Patsy Lager to order the referral.  Copy of MRI results mailed to patient.

## 2013-11-11 ENCOUNTER — Encounter: Payer: Self-pay | Admitting: *Deleted

## 2013-11-12 ENCOUNTER — Other Ambulatory Visit: Payer: Self-pay | Admitting: Family Medicine

## 2013-11-12 ENCOUNTER — Ambulatory Visit: Payer: BC Managed Care – PPO | Admitting: Adult Health

## 2013-11-12 DIAGNOSIS — M25512 Pain in left shoulder: Secondary | ICD-10-CM

## 2013-11-12 DIAGNOSIS — M75102 Unspecified rotator cuff tear or rupture of left shoulder, not specified as traumatic: Secondary | ICD-10-CM

## 2013-11-24 DIAGNOSIS — Z7721 Contact with and (suspected) exposure to potentially hazardous body fluids: Secondary | ICD-10-CM

## 2013-11-24 DIAGNOSIS — T148XXA Other injury of unspecified body region, initial encounter: Secondary | ICD-10-CM

## 2013-11-24 HISTORY — DX: Contact with and (suspected) exposure to potentially hazardous body fluids: Z77.21

## 2013-11-24 HISTORY — DX: Other injury of unspecified body region, initial encounter: T14.8XXA

## 2014-01-26 ENCOUNTER — Ambulatory Visit: Payer: BC Managed Care – PPO | Admitting: Adult Health

## 2014-01-26 ENCOUNTER — Ambulatory Visit (INDEPENDENT_AMBULATORY_CARE_PROVIDER_SITE_OTHER): Payer: BC Managed Care – PPO | Admitting: Adult Health

## 2014-01-26 ENCOUNTER — Encounter: Payer: Self-pay | Admitting: Adult Health

## 2014-01-26 VITALS — BP 142/88 | HR 73 | Temp 98.4°F | Resp 12 | Wt 180.0 lb

## 2014-01-26 DIAGNOSIS — R1013 Epigastric pain: Secondary | ICD-10-CM

## 2014-01-26 DIAGNOSIS — R1031 Right lower quadrant pain: Secondary | ICD-10-CM

## 2014-01-26 DIAGNOSIS — L509 Urticaria, unspecified: Secondary | ICD-10-CM

## 2014-01-26 HISTORY — DX: Right lower quadrant pain: R10.31

## 2014-01-26 HISTORY — DX: Urticaria, unspecified: L50.9

## 2014-01-26 NOTE — Progress Notes (Signed)
Pre visit review using our clinic review tool, if applicable. No additional management support is needed unless otherwise documented below in the visit note. 

## 2014-01-26 NOTE — Patient Instructions (Signed)
  For the hives, try zyrtec 10 mg daily.  We will try this for 2 weeks. If no improvement we will send you to an allergist for complete testing.  For your abdominal pain - I am sending you for CT of the abdomen and pelvis.  Also, please have your labs drawn prior to leaving the office.

## 2014-01-26 NOTE — Assessment & Plan Note (Signed)
Uncertain etiology. Start Zyrtec nightly. If no improvement in 2 weeks will refer to Allergist.

## 2014-01-26 NOTE — Progress Notes (Signed)
   Subjective:    Patient ID: Kayla Hood, female    DOB: [birth year], 48 y.o.   MRN: [id number]  HPI  Kayla Hood is a pleasant 48 y/o female who presents to clinic with c/o ongoing daily hives which occur in the afternoon or evening. Symptoms started approximately 2 weeks ago. They burn/itching. She has taken benadryl with good results; however, she does not like the feeling she gets from benadryl. No new lotions, soaps, detergents, cleaning agents. No new medications.  She is experiencing epigastric discomfort that is waking her up. Symptoms started approximately 2 weeks ago. Relieved by Zantac. She also reports the pain is worse after a heavy meal. Radiates at times around to her back on the right and up toward her shoulder. Nausea but not severe enough to make her vomit.   Current Outpatient Prescriptions on File Prior to Visit  Medication Sig Dispense Refill  . acetaminophen (TYLENOL) 325 MG tablet Take 650 mg by mouth every 6 (six) hours as needed for pain.      Marland Kitchen ibuprofen (ADVIL,MOTRIN) 200 MG tablet Take 600 mg by mouth every 6 (six) hours as needed for pain.      Marland Kitchen zolpidem (AMBIEN) 10 MG tablet Take 0.5 tablets (5 mg total) by mouth at bedtime.  30 tablet  5   No current facility-administered medications on file prior to visit.    Review of Systems  Constitutional: Negative for fever and chills.  Respiratory: Negative.   Cardiovascular: Negative.   Gastrointestinal: Positive for nausea and abdominal pain. Negative for vomiting, diarrhea, constipation, blood in stool and abdominal distention.  Skin:       Hives  Psychiatric/Behavioral: Negative.   All other systems reviewed and are negative.       Objective:   Physical Exam  Constitutional: She is oriented to person, place, and time. She appears well-developed and well-nourished. No distress.  Cardiovascular: Normal rate and regular rhythm.   Pulmonary/Chest: Effort normal. No respiratory distress.  Abdominal: Soft. Bowel  sounds are normal. She exhibits no distension and no mass. There is tenderness. There is guarding. There is no rebound.  Patient was tender RLQ with deep palpation with some radiation of pain towards midline. No tenderness reported with palpation RUQ or epigastric area.  Neurological: She is alert and oriented to person, place, and time.  Skin:  Hives left arm pit.  Psychiatric: She has a normal mood and affect. Her behavior is normal. Judgment and thought content normal.          Assessment & Plan:

## 2014-01-26 NOTE — Assessment & Plan Note (Signed)
RLQ tenderness with palpation and frequent nausea - concerning for appendicitis. Also, her report of symptoms point toward gallbladder. Recommend CT of abdomen/pelvis. Check cbc and cmet. She wanted to wait to have CT done on Wednesday. Instructed to call if symptoms worsen.

## 2014-01-27 ENCOUNTER — Encounter: Payer: Self-pay | Admitting: Adult Health

## 2014-01-27 LAB — CBC WITH DIFFERENTIAL/PLATELET
BASOS PCT: 0.2 % (ref 0.0–3.0)
Basophils Absolute: 0 10*3/uL (ref 0.0–0.1)
EOS ABS: 0.1 10*3/uL (ref 0.0–0.7)
Eosinophils Relative: 1.7 % (ref 0.0–5.0)
HCT: 44.7 % (ref 36.0–46.0)
Hemoglobin: 14.9 g/dL (ref 12.0–15.0)
LYMPHS PCT: 27.7 % (ref 12.0–46.0)
Lymphs Abs: 1.9 10*3/uL (ref 0.7–4.0)
MCHC: 33.3 g/dL (ref 30.0–36.0)
MCV: 90.6 fl (ref 78.0–100.0)
MONOS PCT: 7.7 % (ref 3.0–12.0)
Monocytes Absolute: 0.5 10*3/uL (ref 0.1–1.0)
NEUTROS PCT: 62.7 % (ref 43.0–77.0)
Neutro Abs: 4.2 10*3/uL (ref 1.4–7.7)
Platelets: 328 10*3/uL (ref 150.0–400.0)
RBC: 4.93 Mil/uL (ref 3.87–5.11)
RDW: 12.5 % (ref 11.5–14.6)
WBC: 6.7 10*3/uL (ref 4.5–10.5)

## 2014-01-27 LAB — COMPREHENSIVE METABOLIC PANEL
ALBUMIN: 4.5 g/dL (ref 3.5–5.2)
ALT: 16 U/L (ref 0–35)
AST: 19 U/L (ref 0–37)
Alkaline Phosphatase: 89 U/L (ref 39–117)
BUN: 15 mg/dL (ref 6–23)
CALCIUM: 9.9 mg/dL (ref 8.4–10.5)
CHLORIDE: 106 meq/L (ref 96–112)
CO2: 25 meq/L (ref 19–32)
CREATININE: 0.7 mg/dL (ref 0.4–1.2)
GFR: 102.84 mL/min (ref >60.00)
Glucose, Bld: 85 mg/dL (ref 70–99)
POTASSIUM: 4.5 meq/L (ref 3.5–5.1)
Sodium: 139 mEq/L (ref 135–145)
Total Bilirubin: 0.8 mg/dL (ref 0.3–1.2)
Total Protein: 7.3 g/dL (ref 6.0–8.3)

## 2014-02-04 ENCOUNTER — Encounter: Payer: Self-pay | Admitting: Adult Health

## 2014-02-04 ENCOUNTER — Ambulatory Visit (INDEPENDENT_AMBULATORY_CARE_PROVIDER_SITE_OTHER): Payer: BC Managed Care – PPO | Admitting: Adult Health

## 2014-02-04 VITALS — BP 120/90 | HR 89 | Temp 98.4°F | Resp 16 | Wt 178.0 lb

## 2014-02-04 DIAGNOSIS — R600 Localized edema: Secondary | ICD-10-CM | POA: Insufficient documentation

## 2014-02-04 DIAGNOSIS — R609 Edema, unspecified: Secondary | ICD-10-CM

## 2014-02-04 HISTORY — DX: Localized edema: R60.0

## 2014-02-04 MED ORDER — PREDNISONE 10 MG PO TABS: 10.0000 mg | ORAL_TABLET | Freq: Every day | ORAL | Status: DC

## 2014-02-04 MED ORDER — EPINEPHRINE 0.3 MG/0.3ML IJ SOAJ: 0.3000 mg | Freq: Once | INTRAMUSCULAR | Status: DC

## 2014-02-04 NOTE — Progress Notes (Signed)
Patient ID: Kayla Hood, female   DOB: 1966/06/13, 48 y.o.   MRN: [id number]    Subjective:    Patient ID: Kayla Hood, female    DOB: October 22, 1966, 48 y.o.   MRN: [id number]  HPI  Pt is a pleasant 48 y/o female who presents to clinic with facial edema that began yesterday. She was seen in clinic on 01/26/14 with hives.She was started on Zyrtec with resolution of her symptoms. Isbella works for [employer] in [place]. She reports that they had their carpets professionally cleaned yesterday. By the end of the afternoon she noticed that her lips were swollen. She reports feeling a fullness of her soft palate and also noted that the swelling would "move around her face". She reports mild fatigue, decreased appetite and nausea. She denies difficulty breathing but became concerned when she felt inflammation in her mouth. She took a benadryl but did not think this helped much. Jalina reports a similar incident when she had her own carpets professionally cleaned. Denies any new foods or drink, medications, soaps, lotions, shampoos, etc.    Past Medical History  Diagnosis Date  . Depression   . Chicken pox   . Allergy   . Incontinence of urine   . UTI (lower urinary tract infection)     Past Surgical History  Procedure Laterality Date  . Cervical fusion  2012  . Pelvic laproscopic surgeries      4 prior to 2008    Family History  Problem Relation Age of Onset  . Hyperlipidemia Mother   . Heart disease Father 78    CAD  . Hypertension Father   . Hypertension Brother   . Cancer Maternal Grandfather 88    Stomach Cancer  . Cancer Paternal Grandmother 66    Uterine Cancer  . Stroke Paternal Grandfather 20    CVA   History   Social History  . Marital Status: Married    Spouse Name: Margy Sumler    Number of Children: 1  . Years of Education: 14   Occupational History  . 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 Smoking status: Former Smoker -- 0.25  packs/day for 5 years    Types: Cigarettes    Quit date: 03/21/2000  . Smokeless tobacco: Never Used  . Alcohol Use: 1.2 oz/week    0 Glasses of wine, 2 Cans of beer per week     Comment: Occassionally   . Drug Use: No  . Sexual Activity: Yes   Other Topics Concern  . None   Social History Narrative  . None     Current Outpatient Prescriptions on File Prior to Visit  Medication Sig Dispense Refill  . acetaminophen (TYLENOL) 325 MG tablet Take 650 mg by mouth every 6 (six) hours as needed for pain.      Marland Kitchen ibuprofen (ADVIL,MOTRIN) 200 MG tablet Take 600 mg by mouth every 6 (six) hours as needed for pain.      Marland Kitchen sertraline (ZOLOFT) 50 MG tablet Take 25 mg by mouth daily.      Marland Kitchen zolpidem (AMBIEN) 10 MG tablet Take 0.5 tablets (5 mg total) by mouth at bedtime.  30 tablet  5   No current facility-administered medications on file prior to visit.     Review of Systems  Constitutional: Positive for appetite change and fatigue.  HENT: Positive for facial swelling.   Eyes: Negative for pain and visual disturbance.  Right eye swollen  Respiratory: Negative for chest tightness, shortness of breath and wheezing.   Cardiovascular: Negative.   Gastrointestinal: Positive for nausea.  Allergic/Immunologic: Positive for environmental allergies.  Psychiatric/Behavioral: The patient is nervous/anxious.   All other systems reviewed and are negative.       Objective:  BP 120/90  Pulse 89  Temp(Src) 98.4 F (36.9 C) (Oral)  Resp 16  Wt 178 lb (80.74 kg)  SpO2 97%   Physical Exam  Constitutional: She is oriented to person, place, and time. She appears well-developed and well-nourished.  Appears concerned  HENT:  Head: Normocephalic and atraumatic.  Erythema of pharynx, Edema of right eye, face. Puffiness of lips  Neck: Normal range of motion. Neck supple.  Cardiovascular: Normal rate and regular rhythm.   Pulmonary/Chest: Effort normal. No respiratory distress.    Lymphadenopathy:    She has no cervical adenopathy.  Neurological: She is alert and oriented to person, place, and time.  Psychiatric: She has a normal mood and affect. Her behavior is normal. Judgment and thought content normal.       Assessment & Plan:   1. Facial edema Seen in clinic 01/26/14 for hives of unknown etiology - resolved with zyrtec.  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 Reports similar occurrence when she had her home carpets professionally cleaned. Not in acute distress. Concerned that her allergic responses are becoming more severe. Discussed with pt that I think she needs to be evaluated by Allergist. Pt in agreement.  - Start Prednisone taper 60 mg tapering by 5 mg until complete. - Epi Pen - Ambulatory referral to Allergy

## 2014-02-04 NOTE — Patient Instructions (Signed)
  Also begin a prednisone taper as follows:   Day one-6 tablets  Day 2 - 5 1/2 tablets  Day 3 - 5 tablets  Day 4 - 4 1/2 tablets  Day 5 - 4 tablets  Day 6 - 3 1/2 tablets  Day 7 - 3 tablets  Day 8 - 2 1/2 tablets  Day 9 - 2 tablets  Day 10 - 1 1/2 tablets  Day 11 - 1 tablet  Day 12 - 1/2 tablet and complete  Continue to take zyrtec  Epi Pen as directed

## 2014-02-04 NOTE — Progress Notes (Signed)
Pre visit review using our clinic review tool, if applicable. No additional management support is needed unless otherwise documented below in the visit note. 

## 2014-03-20 ENCOUNTER — Other Ambulatory Visit: Payer: Self-pay | Admitting: Adult Health

## 2014-03-23 ENCOUNTER — Telehealth: Payer: Self-pay | Admitting: Adult Health

## 2014-03-23 NOTE — Telephone Encounter (Signed)
Faxed Rx to pharmacy  

## 2014-03-23 NOTE — Telephone Encounter (Signed)
Called to check status of ambien refill

## 2014-04-15 ENCOUNTER — Encounter: Payer: Self-pay | Admitting: Adult Health

## 2014-04-15 ENCOUNTER — Ambulatory Visit (INDEPENDENT_AMBULATORY_CARE_PROVIDER_SITE_OTHER): Payer: BC Managed Care – PPO | Admitting: Adult Health

## 2014-04-15 VITALS — BP 122/90 | HR 74 | Temp 98.2°F | Resp 14 | Ht 66.5 in | Wt 181.0 lb

## 2014-04-15 DIAGNOSIS — Z0000 Encounter for general adult medical examination without abnormal findings: Secondary | ICD-10-CM

## 2014-04-15 DIAGNOSIS — Z1239 Encounter for other screening for malignant neoplasm of breast: Secondary | ICD-10-CM

## 2014-04-15 DIAGNOSIS — L509 Urticaria, unspecified: Secondary | ICD-10-CM

## 2014-04-15 DIAGNOSIS — E559 Vitamin D deficiency, unspecified: Secondary | ICD-10-CM

## 2014-04-15 LAB — LIPID PANEL
Cholesterol: 238 mg/dL — ABNORMAL HIGH (ref 0–200)
HDL: 46.9 mg/dL (ref >39.00)
LDL CALC: 143 mg/dL — AB (ref 0–99)
Total CHOL/HDL Ratio: 5
Triglycerides: 241 mg/dL — ABNORMAL HIGH (ref 0.0–149.0)
VLDL: 48.2 mg/dL — AB (ref 0.0–40.0)

## 2014-04-15 LAB — VITAMIN B12: VITAMIN B 12: 418 pg/mL (ref 211–911)

## 2014-04-15 MED ORDER — ZOLPIDEM TARTRATE 10 MG PO TABS: 10.0000 mg | ORAL_TABLET | Freq: Every day | ORAL | Status: DC

## 2014-04-15 MED ORDER — SUVOREXANT 10 MG PO TABS: 10.0000 mg | ORAL_TABLET | Freq: Every evening | ORAL | Status: DC | PRN

## 2014-04-15 NOTE — Progress Notes (Signed)
Pre visit review using our clinic review tool, if applicable. No additional management support is needed unless otherwise documented below in the visit note. 

## 2014-04-15 NOTE — Progress Notes (Signed)
Patient ID: Kayla Hood, female   DOB: 01-17-1966, 48 y.o.   MRN: [id number]    Subjective:    Patient ID: Kayla Hood, female    DOB: December 25, 1966, 48 y.o.   MRN: [id number]  HPI Pt is a pleasant 48 y/o female who presents to clinic for her yearly physical. She is still experiencing hives in the evening and taking Allegra which helps her symptoms. She is being followed by dermatology. Taking her medications as prescribed. Followed by GYN. PAP 2014 and normal. Needs mammogram ordered today.   Past Medical History  Diagnosis Date  . Depression   . Chicken pox   . Allergy   . Incontinence of urine   . UTI (lower urinary tract infection)      Past Surgical History  Procedure Laterality Date  . Cervical fusion  2012  . Pelvic laproscopic surgeries      4 prior to 2008     Family History  Problem Relation Age of Onset  . Hyperlipidemia Mother   . Heart disease Father 64    CAD  . Hypertension Father   . Hypertension Brother   . Cancer Maternal Grandfather 55    Stomach Cancer  . Cancer Paternal Grandmother 58    Uterine Cancer  . Stroke Paternal Grandfather 49    CVA     History   Social History  . Marital Status: Married    Spouse Name: Tisha Cline    Number of Children: 1  . Years of Education: 14   Occupational History  . 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 Smoking status: Former Smoker -- 0.25 packs/day for 5 years    Types: Cigarettes    Quit date: 03/21/2000  . Smokeless tobacco: Never Used  . Alcohol Use: 1.2 oz/week    0 Glasses of wine, 2 Cans of beer per week     Comment: Occassionally   . Drug Use: No  . Sexual Activity: Yes   Other Topics Concern  . Not on file   Social History Narrative  . No narrative on file    Current Outpatient Prescriptions on File Prior to Visit  Medication Sig Dispense Refill  . acetaminophen (TYLENOL) 325 MG tablet Take 650 mg by mouth every 6 (six) hours as needed for pain.       Marland Kitchen EPINEPHrine (EPI-PEN) 0.3 mg/0.3 mL SOAJ injection Inject 0.3 mLs (0.3 mg total) into the muscle once.  1 Device  2  . ibuprofen (ADVIL,MOTRIN) 200 MG tablet Take 600 mg by mouth every 6 (six) hours as needed for pain.      Marland Kitchen sertraline (ZOLOFT) 50 MG tablet Take 25 mg by mouth daily.       No current facility-administered medications on file prior to visit.     Review of Systems  Constitutional: Negative.   HENT: Negative.   Eyes: Negative.   Respiratory: Negative.   Cardiovascular: Negative.   Gastrointestinal: Negative.   Endocrine: Negative.   Genitourinary: Negative.   Musculoskeletal: Negative.   Skin: Positive for rash (hives in the evening).  Allergic/Immunologic: Negative.   Neurological: Negative.   Hematological: Negative.   Psychiatric/Behavioral: Positive for sleep disturbance (takes [place]).       Objective:  BP 122/90  Pulse 74  Temp(Src) 98.2 F (36.8 C) (Oral)  Resp 14  Ht 5' 6.5" (1.689 m)  Wt 181 lb (82.101 kg)  BMI 28.78 kg/m2  SpO2 98%   Physical Exam  Constitutional: She is oriented to person, place, and time. She appears well-developed and well-nourished. No distress.  HENT:  Head: Normocephalic and atraumatic.  Right Ear: External ear normal.  Left Ear: External ear normal.  Nose: Nose normal.  Mouth/Throat: Oropharynx is clear and moist.  Eyes: Conjunctivae and EOM are normal. Pupils are equal, round, and reactive to light.  Neck: Normal range of motion. Neck supple. No tracheal deviation present. No thyromegaly present.  Cardiovascular: Normal rate, regular rhythm, normal heart sounds and intact distal pulses.  Exam reveals no gallop and no friction rub.   No murmur heard. Pulmonary/Chest: Effort normal and breath sounds normal. No respiratory distress. She has no wheezes. She has no rales.  Abdominal: Soft. Bowel sounds are normal. She exhibits no distension and no mass. There is no tenderness. There is no rebound and no guarding.    Musculoskeletal: Normal range of motion. She exhibits no edema and no tenderness.  Lymphadenopathy:    She has no cervical adenopathy.  Neurological: She is alert and oriented to person, place, and time. She has normal reflexes. No cranial nerve deficit. Coordination normal.  Skin: Skin is warm and dry. No rash (no hives at present) noted.  Psychiatric: She has a normal mood and affect. Her behavior is normal. Judgment and thought content normal.      Assessment & Plan:   1. Routine general medical examination at a health care facility Normal physical exam excluding pelvic/PAP which was done 2014. Check labs. Mammogram ordered. - Lipid panel - Vitamin B12  2. Vitamin D deficiency Hx of deficiency. Recheck levels today. - Vit D  25 hydroxy (rtn osteoporosis monitoring)  3. Hives Ongoing problem with PM hives. I will check an ANA for possible autoimmune etiology. She is being followed by dermatology. - Antinuclear Antib (ANA)  4. Screening for breast cancer Pt was under the impression that she did not have to have a mammogram yearly if she did not have any hx or family hx of breast cancer. I encouraged her to have a screening mammogram yearly for the early detection. She agrees. Mammogram ordered. She will self schedule - MM DIGITAL SCREENING BILATERAL; Future

## 2014-04-16 LAB — VITAMIN D 25 HYDROXY (VIT D DEFICIENCY, FRACTURES): VIT D 25 HYDROXY: 46 ng/mL (ref 30–89)

## 2014-04-16 LAB — ANA: Anti Nuclear Antibody(ANA): NEGATIVE

## 2014-04-19 ENCOUNTER — Encounter: Payer: Self-pay | Admitting: Adult Health

## 2014-05-16 ENCOUNTER — Ambulatory Visit: Payer: Self-pay

## 2014-07-27 ENCOUNTER — Encounter: Payer: Self-pay | Admitting: Adult Health

## 2014-07-27 ENCOUNTER — Ambulatory Visit (INDEPENDENT_AMBULATORY_CARE_PROVIDER_SITE_OTHER): Payer: BC Managed Care – PPO | Admitting: Adult Health

## 2014-07-27 VITALS — BP 147/91 | HR 88 | Temp 98.7°F | Resp 14 | Ht 66.5 in | Wt 184.2 lb

## 2014-07-27 DIAGNOSIS — R109 Unspecified abdominal pain: Secondary | ICD-10-CM

## 2014-07-27 NOTE — Progress Notes (Signed)
Patient ID: Kayla Hood, female   DOB: 08/21/1966, 48 y.o.   MRN: [id number]   Subjective:    Patient ID: Kayla Hood, female    DOB: 1966-07-04, 48 y.o.   MRN: [id number]  HPI  Reports epigastric pain/RUQ radiating to her back following a heavy meal. Has been ongoing for a couple weeks. Nausea occasionally. Has been taking zantac and pepto bismol but has not helped.  Past Medical History  Diagnosis Date  . Depression   . Chicken pox   . Allergy   . Incontinence of urine   . UTI (lower urinary tract infection)     Current Outpatient Prescriptions on File Prior to Visit  Medication Sig Dispense Refill  . acetaminophen (TYLENOL) 325 MG tablet Take 650 mg by mouth every 6 (six) hours as needed for pain.      Marland Kitchen EPINEPHrine (EPI-PEN) 0.3 mg/0.3 mL SOAJ injection Inject 0.3 mLs (0.3 mg total) into the muscle once.  1 Device  2  . fexofenadine (ALLEGRA) 180 MG tablet Take 180 mg by mouth daily.      Marland Kitchen ibuprofen (ADVIL,MOTRIN) 200 MG tablet Take 600 mg by mouth every 6 (six) hours as needed for pain.      . Probiotic Product (PROBIOTIC DAILY PO) Take by mouth.      . sertraline (ZOLOFT) 50 MG tablet Take 25 mg by mouth daily.      Marland Kitchen zolpidem (AMBIEN) 10 MG tablet Take 1 tablet (10 mg total) by mouth at bedtime.  30 tablet  5  . Cholecalciferol (VITAMIN D3) 2000 UNITS capsule Take 2,000 Units by mouth daily.       No current facility-administered medications on file prior to visit.     Review of Systems  Gastrointestinal: Positive for nausea, vomiting and abdominal pain. Negative for diarrhea and blood in stool.  All other systems reviewed and are negative.      Objective:  BP 147/91  Pulse 88  Temp(Src) 98.7 F (37.1 C) (Oral)  Resp 14  Wt 184 lb 4 oz (83.575 kg)  SpO2 100%   Physical Exam  Constitutional: She is oriented to person, place, and time. No distress.  Cardiovascular: Normal rate and regular rhythm.   Pulmonary/Chest: Effort normal. No respiratory distress.   Abdominal: Soft. She exhibits no mass. There is tenderness. There is no rebound and no guarding.  Bowel sounds hyperactive  Musculoskeletal: Normal range of motion.  Neurological: She is alert and oriented to person, place, and time.  Skin: Skin is warm and dry.  Psychiatric: She has a normal mood and affect. Her behavior is normal. Judgment and thought content normal.      Assessment & Plan:   1. Abdominal pain, unspecified abdominal location Pain suspicious for gallstones vs. Ulcer. Pain is epigastric towards right quadrant and radiates to her back following heavy meal. Check labs and send for xray.  - US Abdomen Limited RUQ; Future - H. pylori antibody, IgG - CBC w/Diff - Comprehensive metabolic panel - Amylase - Lipase

## 2014-07-27 NOTE — Progress Notes (Signed)
Pre visit review using our clinic review tool, if applicable. No additional management support is needed unless otherwise documented below in the visit note. 

## 2014-07-28 ENCOUNTER — Encounter: Payer: Self-pay | Admitting: Adult Health

## 2014-07-28 LAB — COMPREHENSIVE METABOLIC PANEL
ALT: 18 U/L (ref 0–35)
AST: 20 U/L (ref 0–37)
Albumin: 4.4 g/dL (ref 3.5–5.2)
Alkaline Phosphatase: 90 U/L (ref 39–117)
BILIRUBIN TOTAL: 1 mg/dL (ref 0.2–1.2)
BUN: 12 mg/dL (ref 6–23)
CO2: 26 meq/L (ref 19–32)
CREATININE: 0.6 mg/dL (ref 0.4–1.2)
Calcium: 9.7 mg/dL (ref 8.4–10.5)
Chloride: 105 mEq/L (ref 96–112)
GFR: 110.29 mL/min (ref >60.00)
Glucose, Bld: 91 mg/dL (ref 70–99)
Potassium: 4.9 mEq/L (ref 3.5–5.1)
Sodium: 141 mEq/L (ref 135–145)
Total Protein: 6.9 g/dL (ref 6.0–8.3)

## 2014-07-28 LAB — CBC WITH DIFFERENTIAL/PLATELET
BASOS ABS: 0 10*3/uL (ref 0.0–0.1)
Basophils Relative: 0.3 % (ref 0.0–3.0)
Eosinophils Absolute: 0.1 10*3/uL (ref 0.0–0.7)
Eosinophils Relative: 1.6 % (ref 0.0–5.0)
HEMATOCRIT: 40.1 % (ref 36.0–46.0)
HEMOGLOBIN: 13.6 g/dL (ref 12.0–15.0)
LYMPHS ABS: 1.5 10*3/uL (ref 0.7–4.0)
Lymphocytes Relative: 25.6 % (ref 12.0–46.0)
MCHC: 33.9 g/dL (ref 30.0–36.0)
MCV: 88.3 fl (ref 78.0–100.0)
MONO ABS: 0.5 10*3/uL (ref 0.1–1.0)
Monocytes Relative: 8.6 % (ref 3.0–12.0)
NEUTROS ABS: 3.9 10*3/uL (ref 1.4–7.7)
Neutrophils Relative %: 63.9 % (ref 43.0–77.0)
Platelets: 316 10*3/uL (ref 150.0–400.0)
RBC: 4.54 Mil/uL (ref 3.87–5.11)
RDW: 12.4 % (ref 11.5–15.5)
WBC: 6.1 10*3/uL (ref 4.0–10.5)

## 2014-07-28 LAB — LIPASE: Lipase: 33 U/L (ref 11.0–59.0)

## 2014-07-28 LAB — AMYLASE: Amylase: 66 U/L (ref 27–131)

## 2014-07-28 LAB — H. PYLORI ANTIBODY, IGG: H Pylori IgG: NEGATIVE

## 2014-08-03 ENCOUNTER — Encounter: Payer: Self-pay | Admitting: Adult Health

## 2014-08-04 NOTE — Telephone Encounter (Signed)
Results in your folder ready for review.

## 2014-08-05 ENCOUNTER — Encounter: Payer: Self-pay | Admitting: Adult Health

## 2014-08-15 ENCOUNTER — Ambulatory Visit: Payer: Self-pay

## 2014-08-15 LAB — URINALYSIS, COMPLETE
BILIRUBIN, UR: NEGATIVE
Blood: NEGATIVE
Glucose,UR: NEGATIVE
Ketone: NEGATIVE
Nitrite: NEGATIVE
PH: 6 (ref 5.0–8.0)
Protein: NEGATIVE
SPECIFIC GRAVITY: 1.02 (ref 1.000–1.030)

## 2014-08-17 LAB — URINE CULTURE

## 2014-10-06 ENCOUNTER — Telehealth: Payer: Self-pay

## 2014-10-06 MED ORDER — ZOLPIDEM TARTRATE 10 MG PO TABS: 10.0000 mg | ORAL_TABLET | Freq: Every day | ORAL | Status: DC

## 2014-10-06 MED ORDER — ESTRADIOL 0.1 MG/GM VA CREA: 1.0000 g | TOPICAL_CREAM | VAGINAL | Status: DC

## 2014-10-06 NOTE — Telephone Encounter (Signed)
The patient called and is hoping to get her ambien rx and her estrace cream refilled.  She is planning on seeing the new NP, once her schedule is open.   Patient callback - [phone]

## 2014-10-06 NOTE — Telephone Encounter (Signed)
Last OV and Ambien refill 4.22.15.  Estrace cream not listed on pts medication list.  Please advise refill

## 2014-10-06 NOTE — Telephone Encounter (Signed)
Ok to refill,  printed rx  For [facility] ,  Adora Fridge sent

## 2014-10-07 NOTE — Telephone Encounter (Signed)
Rx faxed

## 2014-11-04 ENCOUNTER — Ambulatory Visit: Payer: Self-pay

## 2014-11-05 ENCOUNTER — Other Ambulatory Visit: Payer: Self-pay | Admitting: *Deleted

## 2014-11-05 MED ORDER — SERTRALINE HCL 50 MG PO TABS: 25.0000 mg | ORAL_TABLET | Freq: Every day | ORAL | Status: DC

## 2014-12-08 ENCOUNTER — Ambulatory Visit: Payer: Self-pay | Admitting: Physician Assistant

## 2015-03-05 ENCOUNTER — Ambulatory Visit: Admitting: Nurse Practitioner

## 2015-03-09 ENCOUNTER — Encounter: Payer: Self-pay | Admitting: Nurse Practitioner

## 2015-03-09 ENCOUNTER — Ambulatory Visit (INDEPENDENT_AMBULATORY_CARE_PROVIDER_SITE_OTHER): Payer: BLUE CROSS/BLUE SHIELD | Admitting: Nurse Practitioner

## 2015-03-09 VITALS — BP 146/90 | HR 87 | Temp 98.1°F | Resp 14 | Ht 66.5 in | Wt 181.0 lb

## 2015-03-09 DIAGNOSIS — G479 Sleep disorder, unspecified: Secondary | ICD-10-CM

## 2015-03-09 DIAGNOSIS — J069 Acute upper respiratory infection, unspecified: Secondary | ICD-10-CM | POA: Diagnosis not present

## 2015-03-09 MED ORDER — ZOLPIDEM TARTRATE 10 MG PO TABS: 10.0000 mg | ORAL_TABLET | Freq: Every day | ORAL | Status: DC

## 2015-03-09 MED ORDER — HYDROCHLOROTHIAZIDE 12.5 MG PO TABS: 12.5000 mg | ORAL_TABLET | Freq: Every day | ORAL | Status: DC

## 2015-03-09 MED ORDER — SERTRALINE HCL 50 MG PO TABS: 50.0000 mg | ORAL_TABLET | Freq: Every day | ORAL | Status: DC

## 2015-03-09 NOTE — Patient Instructions (Addendum)
Warm water and salt gargling.   Claritin/Allegra/or Zyrtec will help with post nasal drip  Mucinex plain OTC is helpful to get mucous up and out

## 2015-03-09 NOTE — Progress Notes (Signed)
Subjective:    Patient ID: Kayla Hood, female    DOB: [birth year], 49 y.o.   MRN: [id number]  HPI  Ms. Rostron is a 49 yo female with a CC of medication refills and sore throat.   1) Zoloft 50 mg daily helpful, stressful period of time with moving, husband starting a new job, she is ending hers, and her son is going to college.   2) Sleep disturbance- Ambien for many years takes half 5 mg a night  3) Urgent care a month or so ago and saw BP was elevated. They were concerned with this, but she has never been on a medication.   BP Readings from Last 3 Encounters:  03/09/15 146/90  07/27/14 147/91  04/15/14 122/90   Review of Systems  Constitutional: Negative for fever, chills, diaphoresis and fatigue.  HENT: Positive for sore throat.   Respiratory: Negative for chest tightness, shortness of breath and wheezing.   Cardiovascular: Negative for chest pain, palpitations and leg swelling.  Gastrointestinal: Negative for nausea, vomiting, diarrhea and constipation.  Skin: Negative for rash.  Neurological: Negative for dizziness, weakness, numbness and headaches.  Psychiatric/Behavioral: Positive for sleep disturbance. Negative for suicidal ideas. The patient is nervous/anxious.    Past Medical History  Diagnosis Date  . Depression   . Chicken pox   . Allergy   . Incontinence of urine   . UTI (lower urinary tract infection)     History   Social History  . Marital Status: Married    Spouse Name: Grisell Bissette  . Number of Children: 1  . Years of Education: 14   Occupational History  . 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 Smoking status: Former Smoker -- 0.25 packs/day for 5 years    Types: Cigarettes    Quit date: 03/21/2000  . Smokeless tobacco: Never Used  . Alcohol Use: 1.2 oz/week    0 Glasses of wine, 2 Cans of beer per week     Comment: Occassionally   . Drug Use: No  . Sexual Activity: Yes   Other Topics Concern  . Not on  file   Social History Narrative    Past Surgical History  Procedure Laterality Date  . Cervical fusion  2012  . Pelvic laproscopic surgeries      4 prior to 2008    Family History  Problem Relation Age of Onset  . Hyperlipidemia Mother   . Heart disease Father 66    CAD  . Hypertension Father   . Hypertension Brother   . Cancer Maternal Grandfather 32    Stomach Cancer  . Cancer Paternal Grandmother 81    Uterine Cancer  . Stroke Paternal Grandfather 57    CVA    Allergies  Allergen Reactions  . Phenergan [Promethazine Hcl]     Current Outpatient Prescriptions on File Prior to Visit  Medication Sig Dispense Refill  . acetaminophen (TYLENOL) 325 MG tablet Take 650 mg by mouth every 6 (six) hours as needed for pain.    . Cholecalciferol (VITAMIN D3) 2000 UNITS capsule Take 2,000 Units by mouth daily.    Marland Kitchen EPINEPHrine (EPI-PEN) 0.3 mg/0.3 mL SOAJ injection Inject 0.3 mLs (0.3 mg total) into the muscle once. 1 Device 2  . estradiol (ESTRACE) 0.1 MG/GM vaginal cream Place 1.60 Applicatorfuls vaginally 3 (three) times a week. 42.5 g 1  . ibuprofen (ADVIL,MOTRIN) 200 MG tablet Take 600 mg by mouth every 6 (six) hours  as needed for pain.    . Probiotic Product (PROBIOTIC DAILY PO) Take by mouth.     No current facility-administered medications on file prior to visit.      Objective:   Physical Exam  Constitutional: She is oriented to person, place, and time. She appears well-developed and well-nourished. No distress.  BP 146/90 mmHg  Pulse 87  Temp(Src) 98.1 F (36.7 C) (Oral)  Resp 14  Ht 5' 6.5" (1.689 m)  Wt 181 lb (82.101 kg)  BMI 28.78 kg/m2  SpO2 98%   HENT:  Head: Normocephalic and atraumatic.  Right Ear: External ear normal.  Left Ear: External ear normal.  Mouth/Throat: No oropharyngeal exudate.  Eyes: Right eye exhibits no discharge. Left eye exhibits no discharge. No scleral icterus.  Neck: Normal range of motion. Neck supple. No thyromegaly present.    Cardiovascular: Normal rate, regular rhythm and normal heart sounds.  Exam reveals no gallop and no friction rub.   No murmur heard. Pulmonary/Chest: Effort normal and breath sounds normal. No respiratory distress. She has no wheezes. She has no rales. She exhibits no tenderness.  Lymphadenopathy:    She has no cervical adenopathy.  Neurological: She is alert and oriented to person, place, and time. No cranial nerve deficit. She exhibits normal muscle tone. Coordination normal.  Skin: Skin is warm and dry. No rash noted. She is not diaphoretic.  Psychiatric: She has a normal mood and affect. Her behavior is normal. Judgment and thought content normal.      Assessment & Plan:

## 2015-03-09 NOTE — Assessment & Plan Note (Signed)
Stable. Warm water and salt gargling. Claritin/Allegra/or Zyrtec will help with post nasal drip. Mucinex plain OTC is helpful to get mucous up and out. FU prn worsening/failure to improve.

## 2015-03-09 NOTE — Progress Notes (Signed)
Pre visit review using our clinic review tool, if applicable. No additional management support is needed unless otherwise documented below in the visit note. 

## 2015-03-09 NOTE — Assessment & Plan Note (Signed)
Re-signed [facility] since I could not find the one on file. She has been on Ambien for years and it is helpful 5 mg a night (halves her 10 mg). Will continue this.

## 2015-03-12 ENCOUNTER — Ambulatory Visit: Payer: Self-pay | Admitting: Family Medicine

## 2015-03-25 ENCOUNTER — Other Ambulatory Visit: Payer: Self-pay | Admitting: Internal Medicine

## 2015-03-25 ENCOUNTER — Ambulatory Visit (INDEPENDENT_AMBULATORY_CARE_PROVIDER_SITE_OTHER): Payer: BLUE CROSS/BLUE SHIELD | Admitting: Nurse Practitioner

## 2015-03-25 ENCOUNTER — Encounter: Payer: Self-pay | Admitting: Nurse Practitioner

## 2015-03-25 VITALS — BP 138/88 | HR 79 | Temp 98.6°F | Resp 14 | Ht 66.5 in | Wt 182.8 lb

## 2015-03-25 DIAGNOSIS — M7582 Other shoulder lesions, left shoulder: Secondary | ICD-10-CM | POA: Diagnosis not present

## 2015-03-25 DIAGNOSIS — J069 Acute upper respiratory infection, unspecified: Secondary | ICD-10-CM | POA: Diagnosis not present

## 2015-03-25 DIAGNOSIS — M778 Other enthesopathies, not elsewhere classified: Secondary | ICD-10-CM

## 2015-03-25 MED ORDER — AMOXICILLIN-POT CLAVULANATE 875-125 MG PO TABS
1.0000 | ORAL_TABLET | Freq: Two times a day (BID) | ORAL | Status: DC
Start: 2015-03-25 — End: 2015-06-06

## 2015-03-25 MED ORDER — AMLODIPINE BESYLATE 2.5 MG PO TABS: 2.5000 mg | ORAL_TABLET | Freq: Every day | ORAL | Status: DC

## 2015-03-25 NOTE — Progress Notes (Signed)
Pre visit review using our clinic review tool, if applicable. No additional management support is needed unless otherwise documented below in the visit note. 

## 2015-03-25 NOTE — Progress Notes (Signed)
Subjective:    Patient ID: Kayla Hood, female    DOB: 03-20-1966, 49 y.o.   MRN: [id number]  HPI  Ms. Radick is a 49 yo female with a CC of right arm pain follow up from an urgent care visit, and a productive cough.   1) left shoulder pain (muscle- deltoid) hurting since fall  2) Urgent care visit 3/16 POCT rapid strep negative, culture positive. No sore throat complaints today. Productive cough with thick mucous.    3) HCTZ- dry throat feeling   Review of Systems  Constitutional: Negative for fever, chills, diaphoresis and fatigue.  HENT: Negative for sore throat.        ST resolved  Respiratory: Positive for cough. Negative for chest tightness, shortness of breath and wheezing.   Cardiovascular: Negative for chest pain, palpitations and leg swelling.  Gastrointestinal: Negative for nausea, vomiting, diarrhea and rectal pain.  Musculoskeletal: Positive for arthralgias.       Left shoulder pain  Skin: Negative for rash.  Neurological: Negative for dizziness, weakness, numbness and headaches.  Psychiatric/Behavioral: The patient is not nervous/anxious.    Past Medical History  Diagnosis Date  . Depression   . Chicken pox   . Allergy   . Incontinence of urine   . UTI (lower urinary tract infection)     History   Social History  . Marital Status: Married    Spouse Name: Kayla Hood  . Number of Children: 1  . Years of Education: 14   Occupational History  . 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 Smoking status: Former Smoker -- 0.25 packs/day for 5 years    Types: Cigarettes    Quit date: 03/21/2000  . Smokeless tobacco: Never Used  . Alcohol Use: 1.2 oz/week    0 Glasses of wine, 2 Cans of beer per week     Comment: Occassionally   . Drug Use: No  . Sexual Activity: Yes   Other Topics Concern  . Not on file   Social History Narrative    Past Surgical History  Procedure Laterality Date  . Cervical fusion  2012  .  Pelvic laproscopic surgeries      4 prior to 2008    Family History  Problem Relation Age of Onset  . Hyperlipidemia Mother   . Heart disease Father 47    CAD  . Hypertension Father   . Hypertension Brother   . Cancer Maternal Grandfather 97    Stomach Cancer  . Cancer Paternal Grandmother 72    Uterine Cancer  . Stroke Paternal Grandfather 38    CVA    Allergies  Allergen Reactions  . Promethazine Anaphylaxis  . Phenergan [Promethazine Hcl] Rash    Current Outpatient Prescriptions on File Prior to Visit  Medication Sig Dispense Refill  . acetaminophen (TYLENOL) 325 MG tablet Take 650 mg by mouth every 6 (six) hours as needed for pain.    . Cholecalciferol (VITAMIN D3) 2000 UNITS capsule Take 2,000 Units by mouth daily.    Marland Kitchen EPINEPHrine (EPI-PEN) 0.3 mg/0.3 mL SOAJ injection Inject 0.3 mLs (0.3 mg total) into the muscle once. 1 Device 2  . estradiol (ESTRACE) 0.1 MG/GM vaginal cream Place 2.72 Applicatorfuls vaginally 3 (three) times a week. 42.5 g 1  . ibuprofen (ADVIL,MOTRIN) 200 MG tablet Take 600 mg by mouth every 6 (six) hours as needed for pain.    . Probiotic Product (PROBIOTIC DAILY PO) Take by mouth.    Marland Kitchen  sertraline (ZOLOFT) 50 MG tablet Take 1 tablet (50 mg total) by mouth daily. 30 tablet 3   No current facility-administered medications on file prior to visit.      Objective:   Physical Exam  Constitutional: She is oriented to person, place, and time. She appears well-developed and well-nourished. No distress.  BP 138/88 mmHg  Pulse 79  Temp(Src) 98.6 F (37 C) (Oral)  Resp 14  Ht 5' 6.5" (1.689 m)  Wt 182 lb 12.8 oz (82.918 kg)  BMI 29.07 kg/m2  SpO2 97%   HENT:  Head: Normocephalic and atraumatic.  Right Ear: External ear normal.  Left Ear: External ear normal.  Mouth/Throat: No oropharyngeal exudate.  Eyes: Right eye exhibits no discharge. Left eye exhibits no discharge. No scleral icterus.  Cardiovascular: Normal rate, regular rhythm, normal  heart sounds and intact distal pulses.  Exam reveals no gallop and no friction rub.   No murmur heard. Pulmonary/Chest: Effort normal and breath sounds normal. No respiratory distress. She has no wheezes. She has no rales. She exhibits no tenderness.  Neurological: She is alert and oriented to person, place, and time. No cranial nerve deficit. She exhibits normal muscle tone. Coordination normal.  Skin: Skin is warm and dry. No rash noted. She is not diaphoretic.  Psychiatric: She has a normal mood and affect. Her behavior is normal. Judgment and thought content normal.      Assessment & Plan:

## 2015-03-25 NOTE — Patient Instructions (Signed)
Take the probiotics with the antibiotics.   Make appointment with Brantley Persons

## 2015-03-26 NOTE — Telephone Encounter (Signed)
Called to pharmacy 

## 2015-03-26 NOTE — Telephone Encounter (Signed)
Note from pharmacy states they did not receive Rx from 03/09/15, ok refill?

## 2015-03-29 NOTE — Assessment & Plan Note (Signed)
Asked pt to make appointment with already established care for left shoulder issues.

## 2015-03-29 NOTE — Assessment & Plan Note (Signed)
Urgent care visit on 3/16 had negative POCT rapid strep, but positive culture. Due to long nature of this "viral illness" and positive culture will treat with Augmentin twice daily for 7 days. FU prn worsening/failure to improve.

## 2015-05-02 ENCOUNTER — Other Ambulatory Visit: Payer: Self-pay | Admitting: Internal Medicine

## 2015-05-17 ENCOUNTER — Other Ambulatory Visit: Payer: Self-pay | Admitting: Orthopedic Surgery

## 2015-05-17 DIAGNOSIS — M7501 Adhesive capsulitis of right shoulder: Secondary | ICD-10-CM

## 2015-05-19 ENCOUNTER — Ambulatory Visit: Payer: BLUE CROSS/BLUE SHIELD

## 2015-05-19 ENCOUNTER — Other Ambulatory Visit: Payer: BLUE CROSS/BLUE SHIELD

## 2015-06-06 ENCOUNTER — Ambulatory Visit
Admission: EM | Admit: 2015-06-06 | Discharge: 2015-06-06 | Disposition: A | Discharge status: Home / Self Care | Payer: BLUE CROSS/BLUE SHIELD | Attending: Emergency Medicine | Admitting: Emergency Medicine

## 2015-06-06 DIAGNOSIS — F419 Anxiety disorder, unspecified: Secondary | ICD-10-CM

## 2015-06-06 MED ORDER — HYDROXYZINE HCL 25 MG PO TABS: 25.0000 mg | ORAL_TABLET | Freq: Four times a day (QID) | ORAL | Status: DC

## 2015-06-06 MED ORDER — ALPRAZOLAM 0.5 MG PO TABS: 0.5000 mg | ORAL_TABLET | Freq: Three times a day (TID) | ORAL | Status: DC | PRN

## 2015-06-06 NOTE — Discharge Instructions (Signed)

## 2015-06-06 NOTE — ED Provider Notes (Signed)
HPI  SUBJECTIVE:  Kayla Hood is a 49 y.o. female who presents with anxiety, shakiness, decreased appetite, crying easily, states that she "can't shut her mind off", insomnia for several days. She states that lights seem brighter than usual and noises are louder than usual. She states that she cries easily, and that her "head is burning". She is under an increased amount of stress for the past week, that her son Is Leaving for [employer], She  Recently Quit Her Job, and Is Buying a [employer], Moving Next Week.  In-laws are visiting. Patient Has Tried Decreasing Caffeine and She Is Increasing Her Zoloft 75 Mg Today. There No Other Aggravating or Alleviating Factors. She Has Not Tried Anything Else for This. She Denies Any Illicit Substance Use, Over-The-Counter Decongestants or Weight Loss Pills. She Denies Auditory or Visual Hallucinations. She Denies Nausea, Vomiting, Fevers, Coughing, Wheezing, Chest Pain, Shortness of Breath, Pain Tearing through to Her Back, Abdominal Pain, Anuria. She Has a past Medical History of Depression, Panic Attacks. She States That This Is Identical to Previous Panic Attacks. She Also Has a History of Hypertension, Was Started on Amlodipine One Month Ago, States That She Did Not Take It This Morning. No history of thyroid issues, diabetes, suicide attempts, psychiatric admissions.    Past Medical History  Diagnosis Date  . Depression   . Chicken pox   . Allergy   . Incontinence of urine   . UTI (lower urinary tract infection)     Past Surgical History  Procedure Laterality Date  . Cervical fusion  2012  . Pelvic laproscopic surgeries      4 prior to 2008    Family History  Problem Relation Age of Onset  . Hyperlipidemia Mother   . Heart disease Father 56    CAD  . Hypertension Father   . Hypertension Brother   . Cancer Maternal Grandfather 68    Stomach Cancer  . Cancer Paternal Grandmother 53    Uterine Cancer  . Stroke Paternal Grandfather 64   CVA    History  Substance Use Topics  . Smoking status: Former Smoker -- 0.25 packs/day for 5 years    Types: Cigarettes    Quit date: 03/21/2000  . Smokeless tobacco: Never Used  . Alcohol Use: 1.2 oz/week    0 Glasses of wine, 2 Cans of beer per week     Comment: Occasionally     No current facility-administered medications for this encounter.  Current outpatient prescriptions:  .  amLODipine (NORVASC) 2.5 MG tablet, Take 1 tablet (2.5 mg total) by mouth daily., Disp: 30 tablet, Rfl: 1 .  EPINEPHrine (EPI-PEN) 0.3 mg/0.3 mL SOAJ injection, Inject 0.3 mLs (0.3 mg total) into the muscle once., Disp: 1 Device, Rfl: 2 .  estradiol (ESTRACE) 0.1 MG/GM vaginal cream, Place 4.31 Applicatorfuls vaginally 3 (three) times a week., Disp: 42.5 g, Rfl: 1 .  ibuprofen (ADVIL,MOTRIN) 200 MG tablet, Take 600 mg by mouth every 6 (six) hours as needed for pain., Disp: , Rfl:  .  sertraline (ZOLOFT) 50 MG tablet, Take 1 tablet (50 mg total) by mouth daily., Disp: 30 tablet, Rfl: 3 .  traMADol (ULTRAM) 50 MG tablet, Take 50 mg by mouth every 12 (twelve) hours as needed., Disp: , Rfl:  .  zolpidem (AMBIEN) 10 MG tablet, TAKE 1 TABLET BY MOUTH AT BEDTIME, Disp: 30 tablet, Rfl: 2 .  ALPRAZolam (XANAX) 0.5 MG tablet, Take 1 tablet (0.5 mg total) by mouth 3 (three) times  daily as needed for anxiety., Disp: 6 tablet, Rfl: 0 .  hydrOXYzine (ATARAX/VISTARIL) 25 MG tablet, Take 1 tablet (25 mg total) by mouth every 6 (six) hours., Disp: 30 tablet, Rfl: 0  Allergies  Allergen Reactions  . Promethazine Anaphylaxis  . Phenergan [Promethazine Hcl] Rash     ROS  As noted in HPI.   Physical Exam  BP 148/89 mmHg  Pulse 102  Temp(Src) 98.2 F (36.8 C) (Tympanic)  Resp 18  Ht 5\' 7"  (1.702 m)  Wt 172 lb (78.019 kg)  BMI 26.93 kg/m2  SpO2 97%  Constitutional: Well developed, well nourished, crying, appears anxious Eyes: PERRL, EOMI, conjunctiva normal bilaterally HENT: Normocephalic, atraumatic,mucus  membranes moist Neck: No thyromegaly no thyroid tenderness Respiratory: Clear to auscultation bilaterally, no rales, no wheezing, no rhonchi Cardiovascular: Normal rate and rhythm, no murmurs, no gallops, no rubs GI: Nondistended skin: No rash, skin intact Musculoskeletal: No edema, no tenderness, no deformities Neurologic: Alert & oriented x 3, CN II-XII grossly intact, no motor deficits, sensation grossly intact. Reflexes 2+ upper and lower extremities. Psychiatric: Speech and behavior appropriate, logical thinking, no suicidal or homicidal ideation no auditory or visual hallucinations   ED Course   Medications - No data to display  No orders of the defined types were placed in this encounter.   No results found for this or any previous visit (from the past 24 hour(s)). No results found.  ED Clinical Impression  Anxiety   ED Assessment/Plan  Presentation most consistent with acute anxiety/panic attack, seriously doubt hyper-thyroidism. We'll start on Vistaril 25-50 mg 4 times a day with 50 mg at night, short course of Xanax, patient states she'll follow-up with her primary care physician tomorrow. Discussed signs and symptoms that should prompt return to the ER. Patient and husband agree with plan  Vitals noted, patient did not take her blood pressure medication this morning there is no evidence of hypertensive emergency at this time. Discussed with her signs of hypertensive emergency and advised her to keep her blood pressure under good control.  *This clinic note was created using Dragon dictation software. Therefore, there may be occasional mistakes despite careful proofreading.  ?   Melynda Ripple, MD 06/06/15 1041

## 2015-06-06 NOTE — ED Notes (Signed)
Patient states that she has been having Panic Attacks over the last 2 days. Patient is under a lot of stress recently.

## 2015-06-07 ENCOUNTER — Ambulatory Visit (INDEPENDENT_AMBULATORY_CARE_PROVIDER_SITE_OTHER): Payer: BLUE CROSS/BLUE SHIELD | Admitting: Nurse Practitioner

## 2015-06-07 ENCOUNTER — Encounter: Payer: Self-pay | Admitting: Nurse Practitioner

## 2015-06-07 VITALS — BP 132/100 | HR 108 | Temp 98.8°F | Resp 14 | Ht 66.5 in | Wt 169.4 lb

## 2015-06-07 DIAGNOSIS — F411 Generalized anxiety disorder: Secondary | ICD-10-CM

## 2015-06-07 HISTORY — DX: Generalized anxiety disorder: F41.1

## 2015-06-07 MED ORDER — CLONAZEPAM 0.5 MG PO TABS: 0.5000 mg | ORAL_TABLET | Freq: Two times a day (BID) | ORAL | Status: DC | PRN

## 2015-06-07 NOTE — Progress Notes (Signed)
Subjective:    Patient ID: Kayla Hood, female    DOB: 29-Mar-1966, 49 y.o.   MRN: [id number]  HPI  Ms. Elsbernd is a 49 yo female with a CC of severe anxiety. She is accompanied by her mother today.   1) Urgent care- can't shut off her mind, insomnia for several days until seen by UC. Crying easily, head burning. Recent stressors include moving into new house in [place] at the beginning of July, only son going to college next week, mother is leaving to go back home to [place] on Wednesday, and she feels as if she is in a "corner with no way out".   The vistaril is helpful with sleep, xanax helpful with panic attacks. She has not taken her BP medication in a few days, she reports that sounds are louder, colors are brighter, and she has no appetite.   Wt Readings from Last 3 Encounters:  06/06/15 172 lb (78.019 kg)  03/25/15 182 lb 12.8 oz (82.918 kg)  03/09/15 181 lb (82.101 kg)    Review of Systems  Constitutional: Positive for activity change and appetite change.       No activity/appetite  Neurological: Negative for dizziness, weakness, numbness and headaches.  Psychiatric/Behavioral: Positive for sleep disturbance. Negative for suicidal ideas. The patient is nervous/anxious.    Past Medical History  Diagnosis Date  . Depression   . Chicken pox   . Allergy   . Incontinence of urine   . UTI (lower urinary tract infection)     History   Social History  . Marital Status: Married    Spouse Name: Coral Timme  . Number of Children: 1  . Years of Education: 14   Occupational History  . 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 Smoking status: Former Smoker -- 0.25 packs/day for 5 years    Types: Cigarettes    Quit date: 03/21/2000  . Smokeless tobacco: Never Used  . Alcohol Use: 1.2 oz/week    0 Glasses of wine, 2 Cans of beer per week     Comment: Occasionally   . Drug Use: No  . Sexual Activity: Yes   Other Topics Concern  . Not on  file   Social History Narrative    Past Surgical History  Procedure Laterality Date  . Cervical fusion  2012  . Pelvic laproscopic surgeries      4 prior to 2008    Family History  Problem Relation Age of Onset  . Hyperlipidemia Mother   . Heart disease Father 26    CAD  . Hypertension Father   . Hypertension Brother   . Cancer Maternal Grandfather 57    Stomach Cancer  . Cancer Paternal Grandmother 19    Uterine Cancer  . Stroke Paternal Grandfather 53    CVA    Allergies  Allergen Reactions  . Promethazine Anaphylaxis  . Phenergan [Promethazine Hcl] Rash    Current Outpatient Prescriptions on File Prior to Visit  Medication Sig Dispense Refill  . amLODipine (NORVASC) 2.5 MG tablet Take 1 tablet (2.5 mg total) by mouth daily. 30 tablet 1  . EPINEPHrine (EPI-PEN) 0.3 mg/0.3 mL SOAJ injection Inject 0.3 mLs (0.3 mg total) into the muscle once. 1 Device 2  . estradiol (ESTRACE) 0.1 MG/GM vaginal cream Place 0.86 Applicatorfuls vaginally 3 (three) times a week. 42.5 g 1  . hydrOXYzine (ATARAX/VISTARIL) 25 MG tablet Take 1 tablet (25 mg total) by mouth every  6 (six) hours. 30 tablet 0  . ibuprofen (ADVIL,MOTRIN) 200 MG tablet Take 600 mg by mouth every 6 (six) hours as needed for pain.    Marland Kitchen sertraline (ZOLOFT) 50 MG tablet Take 1 tablet (50 mg total) by mouth daily. 30 tablet 3  . traMADol (ULTRAM) 50 MG tablet Take 50 mg by mouth every 12 (twelve) hours as needed.    . zolpidem (AMBIEN) 10 MG tablet TAKE 1 TABLET BY MOUTH AT BEDTIME 30 tablet 2   No current facility-administered medications on file prior to visit.      Objective:   Physical Exam  Constitutional: She is oriented to person, place, and time. She appears well-developed and well-nourished. No distress.  BP 132/100 mmHg  Pulse 108  Temp(Src) 98.8 F (37.1 C) (Oral)  Resp 14  Ht 5' 6.5" (1.689 m)  Wt 169 lb 6.4 oz (76.839 kg)  BMI 26.94 kg/m2  SpO2 98%   HENT:  Head: Normocephalic and atraumatic.    Right Ear: External ear normal.  Left Ear: External ear normal.  Pulmonary/Chest: She has no rales.  Neurological: She is alert and oriented to person, place, and time. No cranial nerve deficit. She exhibits normal muscle tone. Coordination normal.  Skin: Skin is warm and dry. No rash noted. She is not diaphoretic.  Psychiatric: Her speech is normal. Judgment and thought content normal. Her mood appears anxious. Her affect is not angry, not blunt, not labile and not inappropriate. Thought content is not paranoid and not delusional. Cognition and memory are impaired. She does not express impulsivity or inappropriate judgment. She does not exhibit a depressed mood. She expresses no homicidal and no suicidal ideation. She expresses no suicidal plans and no homicidal plans. She exhibits normal recent memory and normal remote memory.  Patient is very tearful, wringing hands often, she still makes some eye contact, and she looks to mother often for reassurance.        Assessment & Plan:  I have personally spent 46 minutes with the patient and her mother with greater than 50% of time spent on counseling about panic attacks, medications, effects of medications, and coming up with a plan for future care.

## 2015-06-07 NOTE — Assessment & Plan Note (Signed)
Continue Zoloft at 75 mg (started it 2 days ago), Klonopin 0.5 mg twice daily as needed for anxiety- lasts longer than xanax. Continue vistaril. Urgent referral to psychiatry. Pt is anxious, but very much adamant that she is not suicidal/homicidal. She does entertain the idea of being inpatient psych to have help right away. Her mother thinks she can stay until the weekend. We will FU on Thur or Fri. Asked her to try writing down thoughts to get them out of head and on to paper.

## 2015-06-07 NOTE — Progress Notes (Signed)
Pre visit review using our clinic review tool, if applicable. No additional management support is needed unless otherwise documented below in the visit note. 

## 2015-06-07 NOTE — Patient Instructions (Signed)
Try easy things like hand held small sandwiches, smoothies, spoon full of peanut butter, crackers ect...  Panic Attacks Panic attacks are sudden, short-livedsurges of severe anxiety, fear, or discomfort. They may occur for no reason when you are relaxed, when you are anxious, or when you are sleeping. Panic attacks may occur for a number of reasons:   Healthy people occasionally have panic attacks in extreme, life-threatening situations, such as war or natural disasters. Normal anxiety is a protective mechanism of the body that helps [place] react to danger (fight or flight response).  Panic attacks are often seen with anxiety disorders, such as panic disorder, social anxiety disorder, generalized anxiety disorder, and phobias. Anxiety disorders cause excessive or uncontrollable anxiety. They may interfere with your relationships or other life activities.  Panic attacks are sometimes seen with other mental illnesses, such as depression and posttraumatic stress disorder.  Certain medical conditions, prescription medicines, and drugs of abuse can cause panic attacks. SYMPTOMS  Panic attacks start suddenly, peak within 20 minutes, and are accompanied by four or more of the following symptoms:  Pounding heart or fast heart rate (palpitations).  Sweating.  Trembling or shaking.  Shortness of breath or feeling smothered.  Feeling choked.  Chest pain or discomfort.  Nausea or strange feeling in your stomach.  Dizziness, light-headedness, or feeling like you will faint.  Chills or hot flushes.  Numbness or tingling in your lips or hands and feet.  Feeling that things are not real or feeling that you are not yourself.  Fear of losing control or going crazy.  Fear of dying. Some of these symptoms can mimic serious medical conditions. For example, you may think you are having a heart attack. Although panic attacks can be very scary, they are not life threatening. DIAGNOSIS  Panic attacks  are diagnosed through an assessment by your health care provider. Your health care provider will ask questions about your symptoms, such as where and when they occurred. Your health care provider will also ask about your medical history and use of alcohol and drugs, including prescription medicines. Your health care provider may order blood tests or other studies to rule out a serious medical condition. Your health care provider may refer you to a mental health professional for further evaluation. TREATMENT   Most healthy people who have one or two panic attacks in an extreme, life-threatening situation will not require treatment.  The treatment for panic attacks associated with anxiety disorders or other mental illness typically involves counseling with a mental health professional, medicine, or a combination of both. Your health care provider will help determine what treatment is best for you.  Panic attacks due to physical illness usually go away with treatment of the illness. If prescription medicine is causing panic attacks, talk with your health care provider about stopping the medicine, decreasing the dose, or substituting another medicine.  Panic attacks due to alcohol or drug abuse go away with abstinence. Some adults need professional help in order to stop drinking or using drugs. HOME CARE INSTRUCTIONS   Take all medicines as directed by your health care provider.   Schedule and attend follow-up visits as directed by your health care provider. It is important to keep all your appointments. SEEK MEDICAL CARE IF:  You are not able to take your medicines as prescribed.  Your symptoms do not improve or get worse. SEEK IMMEDIATE MEDICAL CARE IF:   You experience panic attack symptoms that are different than your usual symptoms.  You  have serious thoughts about hurting yourself or others.  You are taking medicine for panic attacks and have a serious side effect. MAKE SURE  YOU:  Understand these instructions.  Will watch your condition.  Will get help right away if you are not doing well or get worse. Document Released: 12/11/2005 Document Revised: 12/16/2013 Document Reviewed: 07/25/2013 [facility] Patient Information 2015 [place], [place]. This information is not intended to replace advice given to you by your health care provider. Make sure you discuss any questions you have with your health care provider.

## 2015-06-10 ENCOUNTER — Ambulatory Visit (INDEPENDENT_AMBULATORY_CARE_PROVIDER_SITE_OTHER): Payer: BLUE CROSS/BLUE SHIELD | Admitting: Nurse Practitioner

## 2015-06-10 VITALS — BP 124/82 | HR 98 | Temp 98.7°F | Resp 16 | Ht 66.5 in | Wt 173.4 lb

## 2015-06-10 DIAGNOSIS — F411 Generalized anxiety disorder: Secondary | ICD-10-CM | POA: Diagnosis not present

## 2015-06-10 MED ORDER — SERTRALINE HCL 100 MG PO TABS: 100.0000 mg | ORAL_TABLET | Freq: Every day | ORAL | Status: DC

## 2015-06-10 MED ORDER — LORAZEPAM 0.5 MG PO TABS: 0.5000 mg | ORAL_TABLET | Freq: Two times a day (BID) | ORAL | Status: DC | PRN

## 2015-06-10 NOTE — Progress Notes (Signed)
Pre visit review using our clinic review tool, if applicable. No additional management support is needed unless otherwise documented below in the visit note. 

## 2015-06-10 NOTE — Progress Notes (Signed)
   Subjective:    Patient ID: Kayla Hood, female    DOB: 07/29/1966, 49 y.o.   MRN: [id number]  HPI  Kayla Hood is a 49 yo female with follow up on her anxiety and adjustment of medications.   1) Morning waking up worst, pit of doom in her stomach  [name] is going to be her new home soon. Moving first of July Behavioral- female provider preferred  Stopped Vistaril and felt improved Memory issues- feels she forgets whether she is doing something, what she came in the room for ect...   Klonopin - lasting a few hours and feels it lasts too long.  Review of Systems  Constitutional: Negative for fever, chills, diaphoresis and fatigue.  Respiratory: Negative for chest tightness, shortness of breath and wheezing.   Cardiovascular: Negative for chest pain, palpitations and leg swelling.  Gastrointestinal: Negative for nausea, vomiting and diarrhea.  Skin: Negative for rash.  Neurological: Negative for dizziness, weakness, numbness and headaches.  Psychiatric/Behavioral: Negative for suicidal ideas and sleep disturbance. The patient is nervous/anxious.       Objective:   Physical Exam  Constitutional: She is oriented to person, place, and time. She appears well-developed and well-nourished. No distress.  BP 124/82 mmHg  Pulse 98  Temp(Src) 98.7 F (37.1 C)  Resp 16  Ht 5' 6.5" (1.689 m)  Wt 173 lb 6.4 oz (78.654 kg)  BMI 27.57 kg/m2  SpO2 97%   HENT:  Head: Normocephalic and atraumatic.  Right Ear: External ear normal.  Left Ear: External ear normal.  Cardiovascular: Normal rate, regular rhythm and normal heart sounds.  Exam reveals no gallop and no friction rub.   No murmur heard. Pulmonary/Chest: Effort normal and breath sounds normal. No respiratory distress. She has no wheezes. She has no rales. She exhibits no tenderness.  Neurological: She is alert and oriented to person, place, and time. No cranial nerve deficit. She exhibits normal muscle tone. Coordination  normal.  Skin: Skin is warm and dry. No rash noted. She is not diaphoretic.  Psychiatric: She has a normal mood and affect. Her behavior is normal. Judgment and thought content normal.      Assessment & Plan:

## 2015-06-10 NOTE — Patient Instructions (Addendum)
We will follow up in a few weeks (when you can)   Let me know if the ativan is more helpful.   We will contact you with a referral to a psychology office near [place].

## 2015-06-13 ENCOUNTER — Encounter: Payer: Self-pay | Admitting: Nurse Practitioner

## 2015-06-13 NOTE — Assessment & Plan Note (Signed)
Not as tearful today. She reports some improvement. Her mother is staying with her until Sat. Referral to psychiatry placed for somewhere closer to home. Prefer [facility] [place]. FU prn worsening/failure to improve.

## 2015-06-23 ENCOUNTER — Ambulatory Visit (INDEPENDENT_AMBULATORY_CARE_PROVIDER_SITE_OTHER): Payer: BLUE CROSS/BLUE SHIELD | Admitting: Nurse Practitioner

## 2015-06-23 VITALS — BP 118/78 | HR 86 | Temp 98.4°F | Resp 16 | Ht 67.0 in | Wt 170.6 lb

## 2015-06-23 DIAGNOSIS — F411 Generalized anxiety disorder: Secondary | ICD-10-CM

## 2015-06-23 MED ORDER — LORAZEPAM 0.5 MG PO TABS: 0.5000 mg | ORAL_TABLET | Freq: Two times a day (BID) | ORAL | Status: DC | PRN

## 2015-06-23 MED ORDER — SERTRALINE HCL 25 MG PO TABS: 25.0000 mg | ORAL_TABLET | Freq: Every day | ORAL | Status: DC

## 2015-06-23 MED ORDER — ZOLPIDEM TARTRATE 10 MG PO TABS: 10.0000 mg | ORAL_TABLET | Freq: Every day | ORAL | Status: DC

## 2015-06-23 NOTE — Progress Notes (Signed)
   Subjective:    Patient ID: Kayla Hood, female    DOB: May 20, 1966, 49 y.o.   MRN: [id number]  HPI  Kayla Hood is a 49 yo female with a follow up on anxiety.   1)  Started back on Ambien at night   Morning worst time for her  Half of 1 lorazepam in the morning- helpful  Not using the Klonopin or Xanax- does not like profile Likes 75 mg of Zoloft  ambien last night 1/2 lorazepam yesterday   Review of Systems  Constitutional: Negative for fever, chills, diaphoresis and fatigue.  Respiratory: Negative for chest tightness, shortness of breath and wheezing.   Cardiovascular: Negative for chest pain, palpitations and leg swelling.  Gastrointestinal: Negative for nausea, vomiting and diarrhea.  Skin: Negative for rash.  Neurological: Negative for dizziness, weakness, numbness and headaches.  Psychiatric/Behavioral: Positive for sleep disturbance. Negative for suicidal ideas. The patient is nervous/anxious. The patient is not hyperactive.       Objective:   Physical Exam  Constitutional: She is oriented to person, place, and time. She appears well-developed and well-nourished. No distress.  BP 118/78 mmHg  Pulse 86  Temp(Src) 98.4 F (36.9 C)  Resp 16  Ht 5\' 7"  (1.702 m)  Wt 170 lb 9.6 oz (77.384 kg)  BMI 26.71 kg/m2  SpO2 98%   HENT:  Head: Normocephalic and atraumatic.  Right Ear: External ear normal.  Left Ear: External ear normal.  Cardiovascular: Normal rate, regular rhythm and normal heart sounds.   Pulmonary/Chest: Effort normal and breath sounds normal. No respiratory distress. She has no wheezes. She has no rales. She exhibits no tenderness.  Neurological: She is alert and oriented to person, place, and time. No cranial nerve deficit. She exhibits normal muscle tone. Coordination normal.  Skin: Skin is warm and dry. No rash noted. She is not diaphoretic.  Psychiatric: She has a normal mood and affect. Her behavior is normal. Judgment and thought content normal.    Not tearful on exam today, affect improved since last visit      Assessment & Plan:

## 2015-06-23 NOTE — Progress Notes (Signed)
Pre visit review using our clinic review tool, if applicable. No additional management support is needed unless otherwise documented below in the visit note. 

## 2015-06-24 ENCOUNTER — Ambulatory Visit: Payer: BLUE CROSS/BLUE SHIELD | Admitting: Nurse Practitioner

## 2015-06-30 ENCOUNTER — Ambulatory Visit (INDEPENDENT_AMBULATORY_CARE_PROVIDER_SITE_OTHER): Payer: BLUE CROSS/BLUE SHIELD | Admitting: Psychology

## 2015-06-30 DIAGNOSIS — F4323 Adjustment disorder with mixed anxiety and depressed mood: Secondary | ICD-10-CM | POA: Diagnosis not present

## 2015-07-04 ENCOUNTER — Encounter: Payer: Self-pay | Admitting: Nurse Practitioner

## 2015-07-04 NOTE — Assessment & Plan Note (Signed)
Improving. She feels more stable, still mornings are hard for her. She is set up to see psychology in [place] and she will be establishing with a new PCP closer to her new home in [place]. She was given scripts to continue until established.

## 2015-07-07 ENCOUNTER — Ambulatory Visit (INDEPENDENT_AMBULATORY_CARE_PROVIDER_SITE_OTHER): Payer: BLUE CROSS/BLUE SHIELD | Admitting: Psychology

## 2015-07-07 ENCOUNTER — Telehealth: Payer: Self-pay | Admitting: Behavioral Health

## 2015-07-07 ENCOUNTER — Encounter: Payer: Self-pay | Admitting: Behavioral Health

## 2015-07-07 DIAGNOSIS — F4323 Adjustment disorder with mixed anxiety and depressed mood: Secondary | ICD-10-CM

## 2015-07-07 NOTE — Telephone Encounter (Signed)
Pre-Visit Call completed with patient and chart updated.   Pre-Visit Info documented in Specialty Comments under [employer].    

## 2015-07-08 ENCOUNTER — Ambulatory Visit (INDEPENDENT_AMBULATORY_CARE_PROVIDER_SITE_OTHER): Payer: BLUE CROSS/BLUE SHIELD | Admitting: Family Medicine

## 2015-07-08 ENCOUNTER — Encounter: Payer: Self-pay | Admitting: Family Medicine

## 2015-07-08 VITALS — BP 134/88 | HR 71 | Temp 98.3°F | Ht 67.5 in | Wt 168.2 lb

## 2015-07-08 DIAGNOSIS — F418 Other specified anxiety disorders: Secondary | ICD-10-CM | POA: Insufficient documentation

## 2015-07-08 DIAGNOSIS — F428 Other obsessive-compulsive disorder: Secondary | ICD-10-CM

## 2015-07-08 DIAGNOSIS — R002 Palpitations: Secondary | ICD-10-CM | POA: Diagnosis not present

## 2015-07-08 DIAGNOSIS — N809 Endometriosis, unspecified: Secondary | ICD-10-CM | POA: Insufficient documentation

## 2015-07-08 DIAGNOSIS — F4321 Adjustment disorder with depressed mood: Secondary | ICD-10-CM

## 2015-07-08 DIAGNOSIS — I10 Essential (primary) hypertension: Secondary | ICD-10-CM | POA: Diagnosis not present

## 2015-07-08 DIAGNOSIS — D219 Benign neoplasm of connective and other soft tissue, unspecified: Secondary | ICD-10-CM

## 2015-07-08 HISTORY — DX: Other obsessive-compulsive disorder: F42.8

## 2015-07-08 HISTORY — DX: Endometriosis, unspecified: N80.9

## 2015-07-08 HISTORY — DX: Other specified anxiety disorders: F41.8

## 2015-07-08 HISTORY — DX: Benign neoplasm of connective and other soft tissue, unspecified: D21.9

## 2015-07-08 LAB — CBC WITH DIFFERENTIAL/PLATELET
BASOS PCT: 0.9 % (ref 0.0–3.0)
Basophils Absolute: 0 10*3/uL (ref 0.0–0.1)
Eosinophils Absolute: 0.1 10*3/uL (ref 0.0–0.7)
Eosinophils Relative: 2.3 % (ref 0.0–5.0)
HCT: 41.3 % (ref 36.0–46.0)
Hemoglobin: 13.9 g/dL (ref 12.0–15.0)
Lymphocytes Relative: 31 % (ref 12.0–46.0)
Lymphs Abs: 1.3 10*3/uL (ref 0.7–4.0)
MCHC: 33.5 g/dL (ref 30.0–36.0)
MCV: 88.3 fl (ref 78.0–100.0)
MONO ABS: 0.4 10*3/uL (ref 0.1–1.0)
Monocytes Relative: 8.8 % (ref 3.0–12.0)
NEUTROS PCT: 57 % (ref 43.0–77.0)
Neutro Abs: 2.5 10*3/uL (ref 1.4–7.7)
Platelets: 292 10*3/uL (ref 150.0–400.0)
RBC: 4.68 Mil/uL (ref 3.87–5.11)
RDW: 13 % (ref 11.5–15.5)
WBC: 4.4 10*3/uL (ref 4.0–10.5)

## 2015-07-08 LAB — HEPATIC FUNCTION PANEL
ALBUMIN: 4.5 g/dL (ref 3.5–5.2)
ALK PHOS: 79 U/L (ref 39–117)
ALT: 13 U/L (ref 0–35)
AST: 17 U/L (ref 0–37)
BILIRUBIN DIRECT: 0.1 mg/dL (ref 0.0–0.3)
TOTAL PROTEIN: 7.2 g/dL (ref 6.0–8.3)
Total Bilirubin: 0.9 mg/dL (ref 0.2–1.2)

## 2015-07-08 LAB — LIPID PANEL
Cholesterol: 214 mg/dL — ABNORMAL HIGH (ref 0–200)
HDL: 51.8 mg/dL (ref >39.00)
LDL Cholesterol: 129 mg/dL — ABNORMAL HIGH (ref 0–99)
NonHDL: 162.2
Total CHOL/HDL Ratio: 4
Triglycerides: 164 mg/dL — ABNORMAL HIGH (ref 0.0–149.0)
VLDL: 32.8 mg/dL (ref 0.0–40.0)

## 2015-07-08 LAB — VITAMIN B12: Vitamin B-12: 496 pg/mL (ref 211–911)

## 2015-07-08 LAB — BASIC METABOLIC PANEL
BUN: 10 mg/dL (ref 6–23)
CO2: 27 mEq/L (ref 19–32)
Calcium: 9.8 mg/dL (ref 8.4–10.5)
Chloride: 105 mEq/L (ref 96–112)
Creatinine, Ser: 0.67 mg/dL (ref 0.40–1.20)
GFR: 100.43 mL/min (ref >60.00)
GLUCOSE: 88 mg/dL (ref 70–99)
Potassium: 3.8 mEq/L (ref 3.5–5.1)
Sodium: 140 mEq/L (ref 135–145)

## 2015-07-08 LAB — TSH: TSH: 0.57 u[IU]/mL (ref 0.35–4.50)

## 2015-07-08 MED ORDER — AMLODIPINE BESYLATE 2.5 MG PO TABS: 2.5000 mg | ORAL_TABLET | Freq: Every day | ORAL | Status: DC

## 2015-07-08 MED ORDER — ESCITALOPRAM OXALATE 10 MG PO TABS
10.0000 mg | ORAL_TABLET | Freq: Every day | ORAL | Status: DC
Start: 2015-07-08 — End: 2015-08-12

## 2015-07-08 NOTE — Progress Notes (Signed)
Pre visit review using our clinic review tool, if applicable. No additional management support is needed unless otherwise documented below in the visit note. 

## 2015-07-08 NOTE — Progress Notes (Signed)
Patient ID: Kayla Hood, female    DOB: [birth year]  Age: 49 y.o. MRN: [id number]    Subjective:  Subjective HPI Kayla Hood presents to establish here she just moved from [place] to [place] and was at [employer] in [place].  She has had an inc in stress and anxiety with move and her son going to college.  He palpitations have returned as well.  No cp or sob.  She was also put on norvasc for bp but has not taken it.   Review of Systems  Constitutional: Negative for diaphoresis, appetite change, fatigue and unexpected weight change.  Eyes: Negative for pain, redness and visual disturbance.  Respiratory: Negative for cough, chest tightness, shortness of breath and wheezing.   Cardiovascular: Positive for palpitations. Negative for chest pain and leg swelling.  Endocrine: Negative for cold intolerance, heat intolerance, polydipsia, polyphagia and polyuria.  Genitourinary: Negative for dysuria, frequency and difficulty urinating.  Neurological: Negative for dizziness, light-headedness, numbness and headaches.  Psychiatric/Behavioral: Positive for dysphoric mood. Negative for suicidal ideas, hallucinations, behavioral problems, confusion, sleep disturbance, self-injury, decreased concentration and agitation. The patient is nervous/anxious. The patient is not hyperactive.     History Past Medical History  Diagnosis Date  . Depression   . Chicken pox   . Allergy   . Incontinence of urine   . UTI (lower urinary tract infection)     She has past surgical history that includes Cervical fusion (2012) and pelvic laproscopic surgeries.   Her family history includes Cancer (age of onset: 14) in her maternal grandfather; Cancer (age of onset: 26) in her paternal grandmother; Heart disease (age of onset: 22) in her father; Hyperlipidemia in her mother; Hypertension in her brother and father; Stroke (age of onset: 97) in her paternal grandfather.She reports that she quit smoking about 15  years ago. Her smoking use included Cigarettes. She has a 1.25 pack-year smoking history. She has never used smokeless tobacco. She reports that she drinks about 1.2 oz of alcohol per week. She reports that she does not use illicit drugs.  Current Outpatient Prescriptions on File Prior to Visit  Medication Sig Dispense Refill  . estradiol (ESTRACE) 0.1 MG/GM vaginal cream Place 9.83 Applicatorfuls vaginally 3 (three) times a week. 42.5 g 1  . ibuprofen (ADVIL,MOTRIN) 200 MG tablet Take 600 mg by mouth every 6 (six) hours as needed for pain.    Marland Kitchen LORazepam (ATIVAN) 0.5 MG tablet Take 1 tablet (0.5 mg total) by mouth 2 (two) times daily as needed for anxiety. 30 tablet 1  . zolpidem (AMBIEN) 10 MG tablet Take 1 tablet (10 mg total) by mouth at bedtime. 30 tablet 2   No current facility-administered medications on file prior to visit.     Objective:  Objective Physical Exam  Constitutional: She is oriented to person, place, and time. She appears well-developed and well-nourished.  HENT:  Head: Normocephalic and atraumatic.  Eyes: Conjunctivae and EOM are normal.  Neck: Normal range of motion. Neck supple. No JVD present. Carotid bruit is not present. No thyromegaly present.  Cardiovascular: Normal rate, regular rhythm and normal heart sounds.   No murmur heard. Pulmonary/Chest: Effort normal and breath sounds normal. No respiratory distress. She has no wheezes. She has no rales. She exhibits no tenderness.  Musculoskeletal: She exhibits no edema.  Neurological: She is alert and oriented to person, place, and time.  Psychiatric: She has a normal mood and affect. Her behavior is normal.   BP 134/88 mmHg  Pulse  71  Temp(Src) 98.3 F (36.8 C) (Oral)  Ht 5' 7.5" (1.715 m)  Wt 168 lb 3.2 oz (76.295 kg)  BMI 25.94 kg/m2  SpO2 100% Wt Readings from Last 3 Encounters:  07/08/15 168 lb 3.2 oz (76.295 kg)  06/23/15 170 lb 9.6 oz (77.384 kg)  06/10/15 173 lb 6.4 oz (78.654 kg)     Lab  Results  Component Value Date   WBC 6.1 07/27/2014   HGB 13.6 07/27/2014   HCT 40.1 07/27/2014   PLT 316.0 07/27/2014   GLUCOSE 91 07/27/2014   CHOL 238* 04/15/2014   TRIG 241.0* 04/15/2014   HDL 46.90 04/15/2014   LDLDIRECT 182.5 03/03/2013   LDLCALC 143* 04/15/2014   ALT 18 07/27/2014   AST 20 07/27/2014   NA 141 07/27/2014   K 4.9 07/27/2014   CL 105 07/27/2014   CREATININE 0.6 07/27/2014   BUN 12 07/27/2014   CO2 26 07/27/2014   TSH 0.71 03/03/2013    No results found.   Assessment & Plan:  Plan I have discontinued Kayla Hood's EPINEPHrine, traMADol, and sertraline. I am also having her start on escitalopram. Additionally, I am having her maintain her ibuprofen, estradiol, LORazepam, zolpidem, and amLODipine.  Meds ordered this encounter  Medications  . escitalopram (LEXAPRO) 10 MG tablet    Sig: Take 1 tablet (10 mg total) by mouth daily.    Dispense:  30 tablet    Refill:  2  . amLODipine (NORVASC) 2.5 MG tablet    Sig: Take 1 tablet (2.5 mg total) by mouth daily.    Dispense:  30 tablet    Refill:  1    Problem List Items Addressed This Visit    Palpitations    Pt has seen cardiology in past She was put on BB but it made her too tired She was given norvasc for bp but has not started it yet Probably related to recent increase in stress and anxiety      Relevant Orders   Basic metabolic panel   CBC with Differential/Platelet   Hepatic function panel   Lipid panel   POCT urinalysis dipstick   TSH   Vitamin B12   Depression with anxiety    D/c zoloft and start lexapro 10 mg qd Start with 1/2 tab daily and inc to a whole rto 1 month con't prn ativan       Other Visit Diagnoses    Adjustment disorder with depressed mood    -  Primary    Relevant Medications    escitalopram (LEXAPRO) 10 MG tablet    Essential hypertension        Relevant Medications    amLODipine (NORVASC) 2.5 MG tablet       Follow-up: Return in about 4 weeks (around  08/05/2015), or if symptoms worsen or fail to improve, for depression , anxiety.  Garnet Koyanagi, DO

## 2015-07-08 NOTE — Patient Instructions (Signed)
Generalized Anxiety Disorder Generalized anxiety disorder (GAD) is a mental disorder. It interferes with life functions, including relationships, work, and school. GAD is different from normal anxiety, which everyone experiences at some point in their lives in response to specific life events and activities. Normal anxiety actually helps us prepare for and get through these life events and activities. Normal anxiety goes away after the event or activity is over.  GAD causes anxiety that is not necessarily related to specific events or activities. It also causes excess anxiety in proportion to specific events or activities. The anxiety associated with GAD is also difficult to control. GAD can vary from mild to severe. People with severe GAD can have intense waves of anxiety with physical symptoms (panic attacks).  SYMPTOMS The anxiety and worry associated with GAD are difficult to control. This anxiety and worry are related to many life events and activities and also occur more days than not for 6 months or longer. People with GAD also have three or more of the following symptoms (one or more in children):  Restlessness.   Fatigue.  Difficulty concentrating.   Irritability.  Muscle tension.  Difficulty sleeping or unsatisfying sleep. DIAGNOSIS GAD is diagnosed through an assessment by your health care provider. Your health care provider will ask you questions aboutyour mood,physical symptoms, and events in your life. Your health care provider may ask you about your medical history and use of alcohol or drugs, including prescription medicines. Your health care provider may also do a physical exam and blood tests. Certain medical conditions and the use of certain substances can cause symptoms similar to those associated with GAD. Your health care provider may refer you to a mental health specialist for further evaluation. TREATMENT The following therapies are usually used to treat GAD:    Medication. Antidepressant medication usually is prescribed for long-term daily control. Antianxiety medicines may be added in severe cases, especially when panic attacks occur.   Talk therapy (psychotherapy). Certain types of talk therapy can be helpful in treating GAD by providing support, education, and guidance. A form of talk therapy called cognitive behavioral therapy can teach you healthy ways to think about and react to daily life events and activities.  Stress managementtechniques. These include yoga, meditation, and exercise and can be very helpful when they are practiced regularly. A mental health specialist can help determine which treatment is best for you. Some people see improvement with one therapy. However, other people require a combination of therapies. Document Released: 04/07/2013 Document Revised: 04/27/2014 Document Reviewed: 04/07/2013 Exit[employer] Patient Information 2015 [employer]. This information is not intended to replace advice given to you by your health care provider. Make sure you discuss any questions you have with your health care provider.  

## 2015-07-08 NOTE — Assessment & Plan Note (Signed)
Pt has seen cardiology in past She was put on BB but it made her too tired She was given norvasc for bp but has not started it yet Probably related to recent increase in stress and anxiety

## 2015-07-08 NOTE — Assessment & Plan Note (Signed)
D/c zoloft and start lexapro 10 mg qd Start with 1/2 tab daily and inc to a whole rto 1 month con't prn ativan

## 2015-07-09 LAB — POCT URINALYSIS DIPSTICK
BILIRUBIN UA: NEGATIVE
Blood, UA: NEGATIVE
GLUCOSE UA: NEGATIVE
KETONES UA: NEGATIVE
Leukocytes, UA: NEGATIVE
NITRITE UA: NEGATIVE
PROTEIN UA: NEGATIVE
Spec Grav, UA: 1.015
UROBILINOGEN UA: 0.2
pH, UA: 6

## 2015-07-13 ENCOUNTER — Ambulatory Visit: Payer: BLUE CROSS/BLUE SHIELD | Admitting: Psychiatry

## 2015-07-14 ENCOUNTER — Ambulatory Visit (INDEPENDENT_AMBULATORY_CARE_PROVIDER_SITE_OTHER): Payer: BLUE CROSS/BLUE SHIELD | Admitting: Psychology

## 2015-07-14 DIAGNOSIS — F4323 Adjustment disorder with mixed anxiety and depressed mood: Secondary | ICD-10-CM

## 2015-08-02 ENCOUNTER — Encounter: Payer: Self-pay | Admitting: Nurse Practitioner

## 2015-08-11 ENCOUNTER — Ambulatory Visit (INDEPENDENT_AMBULATORY_CARE_PROVIDER_SITE_OTHER): Admitting: Psychology

## 2015-08-11 DIAGNOSIS — F331 Major depressive disorder, recurrent, moderate: Secondary | ICD-10-CM

## 2015-08-11 DIAGNOSIS — F902 Attention-deficit hyperactivity disorder, combined type: Secondary | ICD-10-CM

## 2015-08-11 DIAGNOSIS — F410 Panic disorder [episodic paroxysmal anxiety] without agoraphobia: Secondary | ICD-10-CM | POA: Diagnosis not present

## 2015-08-12 ENCOUNTER — Encounter: Payer: Self-pay | Admitting: Family Medicine

## 2015-08-12 ENCOUNTER — Ambulatory Visit (INDEPENDENT_AMBULATORY_CARE_PROVIDER_SITE_OTHER): Payer: BLUE CROSS/BLUE SHIELD | Admitting: Family Medicine

## 2015-08-12 VITALS — BP 110/78 | HR 73 | Temp 98.9°F | Wt 168.0 lb

## 2015-08-12 DIAGNOSIS — F419 Anxiety disorder, unspecified: Secondary | ICD-10-CM | POA: Diagnosis not present

## 2015-08-12 DIAGNOSIS — F418 Other specified anxiety disorders: Secondary | ICD-10-CM

## 2015-08-12 MED ORDER — ESCITALOPRAM OXALATE 20 MG PO TABS: 20.0000 mg | ORAL_TABLET | Freq: Every day | ORAL | Status: DC

## 2015-08-12 NOTE — Patient Instructions (Signed)
Generalized Anxiety Disorder Generalized anxiety disorder (GAD) is a mental disorder. It interferes with life functions, including relationships, work, and school. GAD is different from normal anxiety, which everyone experiences at some point in their lives in response to specific life events and activities. Normal anxiety actually helps us prepare for and get through these life events and activities. Normal anxiety goes away after the event or activity is over.  GAD causes anxiety that is not necessarily related to specific events or activities. It also causes excess anxiety in proportion to specific events or activities. The anxiety associated with GAD is also difficult to control. GAD can vary from mild to severe. People with severe GAD can have intense waves of anxiety with physical symptoms (panic attacks).  SYMPTOMS The anxiety and worry associated with GAD are difficult to control. This anxiety and worry are related to many life events and activities and also occur more days than not for 6 months or longer. People with GAD also have three or more of the following symptoms (one or more in children):  Restlessness.   Fatigue.  Difficulty concentrating.   Irritability.  Muscle tension.  Difficulty sleeping or unsatisfying sleep. DIAGNOSIS GAD is diagnosed through an assessment by your health care provider. Your health care provider will ask you questions aboutyour mood,physical symptoms, and events in your life. Your health care provider may ask you about your medical history and use of alcohol or drugs, including prescription medicines. Your health care provider may also do a physical exam and blood tests. Certain medical conditions and the use of certain substances can cause symptoms similar to those associated with GAD. Your health care provider may refer you to a mental health specialist for further evaluation. TREATMENT The following therapies are usually used to treat GAD:    Medication. Antidepressant medication usually is prescribed for long-term daily control. Antianxiety medicines may be added in severe cases, especially when panic attacks occur.   Talk therapy (psychotherapy). Certain types of talk therapy can be helpful in treating GAD by providing support, education, and guidance. A form of talk therapy called cognitive behavioral therapy can teach you healthy ways to think about and react to daily life events and activities.  Stress managementtechniques. These include yoga, meditation, and exercise and can be very helpful when they are practiced regularly. A mental health specialist can help determine which treatment is best for you. Some people see improvement with one therapy. However, other people require a combination of therapies. Document Released: 04/07/2013 Document Revised: 04/27/2014 Document Reviewed: 04/07/2013 Exit[employer] Patient Information 2015 [employer]. This information is not intended to replace advice given to you by your health care provider. Make sure you discuss any questions you have with your health care provider.  

## 2015-08-12 NOTE — Progress Notes (Signed)
Pre visit review using our clinic review tool, if applicable. No additional management support is needed unless otherwise documented below in the visit note. 

## 2015-08-13 ENCOUNTER — Ambulatory Visit (INDEPENDENT_AMBULATORY_CARE_PROVIDER_SITE_OTHER): Payer: BLUE CROSS/BLUE SHIELD | Admitting: Psychology

## 2015-08-13 DIAGNOSIS — F4323 Adjustment disorder with mixed anxiety and depressed mood: Secondary | ICD-10-CM

## 2015-08-13 NOTE — Assessment & Plan Note (Signed)
Inc lexapro 20 mg qd  con't ativan

## 2015-08-13 NOTE — Progress Notes (Signed)
Patient ID: Kayla Hood, female    DOB: [birth year]  Age: 49 y.o. MRN: [id number]    Subjective:  Subjective HPI Kayla Hood presents for f/u depression / anxiety.  She is doing well but would like to increase   Review of Systems  Constitutional: Negative for diaphoresis, appetite change, fatigue and unexpected weight change.  Eyes: Negative for pain, redness and visual disturbance.  Respiratory: Negative for cough, chest tightness, shortness of breath and wheezing.   Cardiovascular: Negative for chest pain, palpitations and leg swelling.  Endocrine: Negative for cold intolerance, heat intolerance, polydipsia, polyphagia and polyuria.  Genitourinary: Negative for dysuria, frequency and difficulty urinating.  Neurological: Negative for dizziness, light-headedness, numbness and headaches.    History Past Medical History  Diagnosis Date  . Depression   . Chicken pox   . Allergy   . Incontinence of urine   . UTI (lower urinary tract infection)     She has past surgical history that includes Cervical fusion (2012) and pelvic laproscopic surgeries.   Her family history includes Cancer (age of onset: 34) in her maternal grandfather; Cancer (age of onset: 9) in her paternal grandmother; Heart disease (age of onset: 9) in her father; Hyperlipidemia in her mother; Hypertension in her brother and father; Stroke (age of onset: 36) in her paternal grandfather.She reports that she quit smoking about 15 years ago. Her smoking use included Cigarettes. She has a 1.25 pack-year smoking history. She has never used smokeless tobacco. She reports that she drinks about 1.2 oz of alcohol per week. She reports that she does not use illicit drugs.  Current Outpatient Prescriptions on File Prior to Visit  Medication Sig Dispense Refill  . amLODipine (NORVASC) 2.5 MG tablet Take 1 tablet (2.5 mg total) by mouth daily. 30 tablet 1  . estradiol (ESTRACE) 0.1 MG/GM vaginal cream Place 1.21 Applicatorfuls  vaginally 3 (three) times a week. 42.5 g 1  . ibuprofen (ADVIL,MOTRIN) 200 MG tablet Take 600 mg by mouth every 6 (six) hours as needed for pain.    Marland Kitchen LORazepam (ATIVAN) 0.5 MG tablet Take 1 tablet (0.5 mg total) by mouth 2 (two) times daily as needed for anxiety. 30 tablet 1  . zolpidem (AMBIEN) 10 MG tablet Take 1 tablet (10 mg total) by mouth at bedtime. 30 tablet 2   No current facility-administered medications on file prior to visit.     Objective:  Objective Physical Exam  Constitutional: She is oriented to person, place, and time. She appears well-developed and well-nourished.  HENT:  Head: Normocephalic and atraumatic.  Eyes: Conjunctivae and EOM are normal.  Neck: Normal range of motion. Neck supple. No JVD present. Carotid bruit is not present. No thyromegaly present.  Cardiovascular: Normal rate, regular rhythm and normal heart sounds.   No murmur heard. Pulmonary/Chest: Effort normal and breath sounds normal. No respiratory distress. She has no wheezes. She has no rales. She exhibits no tenderness.  Musculoskeletal: She exhibits no edema.  Neurological: She is alert and oriented to person, place, and time.  Psychiatric: She has a normal mood and affect. Her behavior is normal.   BP 110/78 mmHg  Pulse 73  Temp(Src) 98.9 F (37.2 C) (Oral)  Wt 168 lb (76.204 kg)  SpO2 98% Wt Readings from Last 3 Encounters:  08/12/15 168 lb (76.204 kg)  07/08/15 168 lb 3.2 oz (76.295 kg)  06/23/15 170 lb 9.6 oz (77.384 kg)     Lab Results  Component Value Date   WBC 4.4  07/08/2015   HGB 13.9 07/08/2015   HCT 41.3 07/08/2015   PLT 292.0 07/08/2015   GLUCOSE 88 07/08/2015   CHOL 214* 07/08/2015   TRIG 164.0* 07/08/2015   HDL 51.80 07/08/2015   LDLDIRECT 182.5 03/03/2013   LDLCALC 129* 07/08/2015   ALT 13 07/08/2015   AST 17 07/08/2015   NA 140 07/08/2015   K 3.8 07/08/2015   CL 105 07/08/2015   CREATININE 0.67 07/08/2015   BUN 10 07/08/2015   CO2 27 07/08/2015   TSH  0.57 07/08/2015    No results found.   Assessment & Plan:  Plan I have discontinued Ms. Karch's escitalopram. I am also having her start on escitalopram. Additionally, I am having her maintain her ibuprofen, estradiol, LORazepam, zolpidem, and amLODipine.  Meds ordered this encounter  Medications  . escitalopram (LEXAPRO) 20 MG tablet    Sig: Take 1 tablet (20 mg total) by mouth daily.    Dispense:  30 tablet    Refill:  2    Problem List Items Addressed This Visit    Depression with anxiety    Inc lexapro 20 mg qd  con't ativan         Other Visit Diagnoses    Anxiety    -  Primary    Relevant Medications    escitalopram (LEXAPRO) 20 MG tablet       Follow-up: Return in about 3 months (around 11/12/2015), or if symptoms worsen or fail to improve, for anxiety .  Garnet Koyanagi, DO

## 2015-08-25 ENCOUNTER — Ambulatory Visit (INDEPENDENT_AMBULATORY_CARE_PROVIDER_SITE_OTHER): Payer: BLUE CROSS/BLUE SHIELD | Admitting: Psychology

## 2015-08-25 DIAGNOSIS — F4323 Adjustment disorder with mixed anxiety and depressed mood: Secondary | ICD-10-CM | POA: Diagnosis not present

## 2015-09-06 ENCOUNTER — Ambulatory Visit (INDEPENDENT_AMBULATORY_CARE_PROVIDER_SITE_OTHER): Payer: BLUE CROSS/BLUE SHIELD | Admitting: Psychology

## 2015-09-06 DIAGNOSIS — F4323 Adjustment disorder with mixed anxiety and depressed mood: Secondary | ICD-10-CM

## 2015-09-29 ENCOUNTER — Ambulatory Visit (INDEPENDENT_AMBULATORY_CARE_PROVIDER_SITE_OTHER): Payer: BLUE CROSS/BLUE SHIELD | Admitting: Psychology

## 2015-09-29 DIAGNOSIS — F4323 Adjustment disorder with mixed anxiety and depressed mood: Secondary | ICD-10-CM | POA: Diagnosis not present

## 2015-10-20 ENCOUNTER — Ambulatory Visit (INDEPENDENT_AMBULATORY_CARE_PROVIDER_SITE_OTHER): Payer: BLUE CROSS/BLUE SHIELD | Admitting: Psychology

## 2015-10-20 DIAGNOSIS — F4323 Adjustment disorder with mixed anxiety and depressed mood: Secondary | ICD-10-CM | POA: Diagnosis not present

## 2015-10-21 ENCOUNTER — Telehealth: Payer: Self-pay | Admitting: Family Medicine

## 2015-10-21 NOTE — Telephone Encounter (Signed)
Ok to refill--- if pt is able-- it would be good to try to decrease to bid and eventually to q week

## 2015-10-21 NOTE — Telephone Encounter (Signed)
Please advise if this refill is appropriate with these directions.       KP

## 2015-10-21 NOTE — Telephone Encounter (Signed)
Relation to YT:RZNB Call back [phone]   Reason for call:  Patient requesting a refill estradiol (ESTRACE) 0.1 MG/GM vaginal cream

## 2015-10-22 MED ORDER — ESTRADIOL 0.1 MG/GM VA CREA: 0.2500 | TOPICAL_CREAM | VAGINAL | Status: DC

## 2015-10-22 NOTE — Telephone Encounter (Signed)
I've tried calling the patient and her VM was not setup yet.     KP

## 2015-11-10 ENCOUNTER — Ambulatory Visit (INDEPENDENT_AMBULATORY_CARE_PROVIDER_SITE_OTHER): Payer: BLUE CROSS/BLUE SHIELD | Admitting: Psychology

## 2015-11-10 DIAGNOSIS — F4323 Adjustment disorder with mixed anxiety and depressed mood: Secondary | ICD-10-CM | POA: Diagnosis not present

## 2015-12-22 ENCOUNTER — Encounter: Payer: Self-pay | Admitting: Physician Assistant

## 2015-12-22 ENCOUNTER — Ambulatory Visit (INDEPENDENT_AMBULATORY_CARE_PROVIDER_SITE_OTHER): Payer: BLUE CROSS/BLUE SHIELD

## 2015-12-22 ENCOUNTER — Ambulatory Visit (INDEPENDENT_AMBULATORY_CARE_PROVIDER_SITE_OTHER): Payer: BLUE CROSS/BLUE SHIELD | Admitting: Physician Assistant

## 2015-12-22 VITALS — BP 147/88 | HR 85 | Ht 67.5 in | Wt 181.0 lb

## 2015-12-22 DIAGNOSIS — F419 Anxiety disorder, unspecified: Secondary | ICD-10-CM | POA: Insufficient documentation

## 2015-12-22 DIAGNOSIS — Z131 Encounter for screening for diabetes mellitus: Secondary | ICD-10-CM

## 2015-12-22 DIAGNOSIS — L732 Hidradenitis suppurativa: Secondary | ICD-10-CM

## 2015-12-22 DIAGNOSIS — I10 Essential (primary) hypertension: Secondary | ICD-10-CM

## 2015-12-22 DIAGNOSIS — Z1239 Encounter for other screening for malignant neoplasm of breast: Secondary | ICD-10-CM | POA: Diagnosis not present

## 2015-12-22 DIAGNOSIS — N951 Menopausal and female climacteric states: Secondary | ICD-10-CM | POA: Insufficient documentation

## 2015-12-22 DIAGNOSIS — E8941 Symptomatic postprocedural ovarian failure: Secondary | ICD-10-CM | POA: Insufficient documentation

## 2015-12-22 DIAGNOSIS — N958 Other specified menopausal and perimenopausal disorders: Secondary | ICD-10-CM

## 2015-12-22 DIAGNOSIS — Z1231 Encounter for screening mammogram for malignant neoplasm of breast: Secondary | ICD-10-CM | POA: Diagnosis not present

## 2015-12-22 DIAGNOSIS — E785 Hyperlipidemia, unspecified: Secondary | ICD-10-CM

## 2015-12-22 HISTORY — DX: Symptomatic postprocedural ovarian failure: E89.41

## 2015-12-22 HISTORY — DX: Essential (primary) hypertension: I10

## 2015-12-22 HISTORY — DX: Hyperlipidemia, unspecified: E78.5

## 2015-12-22 HISTORY — DX: Menopausal and female climacteric states: N95.1

## 2015-12-22 HISTORY — DX: Anxiety disorder, unspecified: F41.9

## 2015-12-22 HISTORY — DX: Hidradenitis suppurativa: L73.2

## 2015-12-22 HISTORY — DX: Encounter for screening for diabetes mellitus: Z13.1

## 2015-12-22 LAB — COMPLETE METABOLIC PANEL WITH GFR
ALT: 16 U/L (ref 6–29)
AST: 19 U/L (ref 10–35)
Albumin: 4.5 g/dL (ref 3.6–5.1)
Alkaline Phosphatase: 80 U/L (ref 33–115)
BUN: 15 mg/dL (ref 7–25)
CHLORIDE: 105 mmol/L (ref 98–110)
CO2: 24 mmol/L (ref 20–31)
CREATININE: 0.61 mg/dL (ref 0.50–1.10)
Calcium: 9.7 mg/dL (ref 8.6–10.2)
GFR, Est African American: >89 mL/min (ref >=60)
GFR, Est Non African American: >89 mL/min (ref >=60)
Glucose, Bld: 94 mg/dL (ref 65–99)
Potassium: 5.1 mmol/L (ref 3.5–5.3)
Sodium: 139 mmol/L (ref 135–146)
Total Bilirubin: 0.7 mg/dL (ref 0.2–1.2)
Total Protein: 7.1 g/dL (ref 6.1–8.1)

## 2015-12-22 LAB — LIPID PANEL
Cholesterol: 253 mg/dL — ABNORMAL HIGH (ref 125–200)
HDL: 55 mg/dL (ref >=46)
LDL CALC: 161 mg/dL — AB (ref <130)
Total CHOL/HDL Ratio: 4.6 Ratio (ref <=5.0)
Triglycerides: 186 mg/dL — ABNORMAL HIGH (ref <150)
VLDL: 37 mg/dL — ABNORMAL HIGH (ref <30)

## 2015-12-22 LAB — TSH: TSH: 0.93 u[IU]/mL (ref 0.350–4.500)

## 2015-12-22 MED ORDER — ESCITALOPRAM OXALATE 10 MG PO TABS: 10.0000 mg | ORAL_TABLET | Freq: Every day | ORAL | Status: DC

## 2015-12-22 MED ORDER — AMBULATORY NON FORMULARY MEDICATION: Status: DC

## 2015-12-22 MED ORDER — ZOLPIDEM TARTRATE 10 MG PO TABS: 10.0000 mg | ORAL_TABLET | Freq: Every day | ORAL | Status: DC

## 2015-12-22 MED ORDER — AMLODIPINE BESYLATE 2.5 MG PO TABS: 2.5000 mg | ORAL_TABLET | Freq: Every day | ORAL | Status: DC

## 2015-12-22 MED ORDER — DOXYCYCLINE HYCLATE 100 MG PO TABS: 100.0000 mg | ORAL_TABLET | Freq: Two times a day (BID) | ORAL | Status: DC

## 2015-12-22 NOTE — Patient Instructions (Addendum)
Red yeast rice for cholesterol.   Hidradenitis Suppurativa Hidradenitis suppurativa is a long-term (chronic) skin disease that starts with blocked sweat glands or hair follicles. Bacteria may grow in these blocked openings of your skin. Hidradenitis suppurativa is like a severe form of acne that develops in areas of your body where acne would be unusual. It is most likely to affect the areas of your body where skin rubs against skin and becomes moist. This includes your:  Underarms.  Groin.  Genital areas.  Buttocks.  Upper thighs.  Breasts. Hidradenitis suppurativa may start out with small pimples. The pimples can develop into deep sores that break open (rupture) and drain pus. Over time your skin may thicken and become scarred. Hidradenitis suppurativa cannot be passed from person to person.  CAUSES  The exact cause of hidradenitis suppurativa is not known. This condition may be due to:  Female and female hormones. The condition is rare before and after puberty.  An overactive body defense system (immune system). Your immune system may overreact to the blocked hair follicles or sweat glands and cause swelling and pus-filled sores. RISK FACTORS You may have a higher risk of hidradenitis suppurativa if you:  Are a woman.  Are between ages 73 and 27.  Have a family history of hidradenitis suppurativa.  Have a personal history of acne.  Are overweight.  Smoke.  Take the drug lithium. SIGNS AND SYMPTOMS  The first signs of an outbreak are usually painful skin bumps that look like pimples. As the condition progresses:  Skin bumps may get bigger and grow deeper into the skin.  Bumps under the skin may rupture and drain smelly pus.  Skin may become itchy and infected.  Skin may thicken and scar.  Drainage may continue through tunnels under the skin (fistulas).  Walking and moving your arms can become painful. DIAGNOSIS  Your health care provider may diagnose hidradenitis  suppurativa based on your medical history and your signs and symptoms. A physical exam will also be done. You may need to see a health care provider who specializes in skin diseases (dermatologist). You may also have tests done to confirm the diagnosis. These can include:  Swabbing a sample of pus or drainage from your skin so it can be sent to the lab and tested for infection.  Blood tests to check for infection. TREATMENT  The same treatment will not work for everybody with hidradenitis suppurativa. Your treatment will depend on how severe your symptoms are. You may need to try several treatments to find what works best for you. Part of your treatment may include cleaning and bandaging (dressing) your wounds. You may also have to take medicines, such as the following:  Antibiotics.  Acne medicines.  Medicines to block or suppress the immune system.  A diabetes medicine (metformin) is sometimes used to treat this condition.  For women, birth control pills can sometimes help relieve symptoms. You may need surgery if you have a severe case of hidradenitis suppurativa that does not respond to medicine. Surgery may involve:   Using a laser to clear the skin and remove hair follicles.  Opening and draining deep sores.  Removing the areas of skin that are diseased and scarred. HOME CARE INSTRUCTIONS  Learn as much as you can about your disease, and work closely with your health care providers.  Take medicines only as directed by your health care provider.  If you were prescribed an antibiotic medicine, finish it all even if you start  to feel better.  If you are overweight, losing weight may be very helpful. Try to reach and maintain a healthy weight.  Do not use any tobacco products, including cigarettes, chewing tobacco, or electronic cigarettes. If you need help quitting, ask your health care provider.  Do not shave the areas where you get hidradenitis suppurativa.  Do not wear  deodorant.  Wear loose-fitting clothes.  Try not to overheat and get sweaty.  Take a daily bleach bath as directed by your health care provider.  Fill your bathtub halfway with water.  Pour in  cup of unscented household bleach.  Soak for 5-10 minutes.  Cover sore areas with a warm, clean washcloth (compress) for 5-10 minutes. SEEK MEDICAL CARE IF:   You have a flare-up of hidradenitis suppurativa.  You have chills or a fever.  You are having trouble controlling your symptoms at home.   This information is not intended to replace advice given to you by your health care provider. Make sure you discuss any questions you have with your health care provider.   Document Released: 07/25/2004 Document Revised: 01/01/2015 Document Reviewed: 03/13/2014 Elsevier Interactive Patient Education [employer].

## 2015-12-22 NOTE — Progress Notes (Signed)
Subjective:    Patient ID: Kayla Hood, female    DOB: 06-Jan-1966, 49 y.o.   MRN: [id number]  HPI  Patient is a 49 year old female who presents to the clinic to establish care.  .. Active Ambulatory Problems    Diagnosis Date Noted  . Abnormal chest x-ray 03/03/2013  . Irregular heart beat 03/03/2013  . Sleep disturbance 03/03/2013  . General medical exam 03/03/2013  . Palpitations 03/21/2013  . Left hip pain 06/28/2013  . Tendonitis of shoulder 09/01/2013  . Hives 01/26/2014  . RLQ abdominal pain 01/26/2014  . Facial edema 02/04/2014  . Viral URI 03/09/2015  . Anxiety state 06/07/2015  . Depression with anxiety 07/08/2015  . Endometriosis 07/08/2015  . Fibroid 07/08/2015  . Exposure to blood or body fluid 11/24/2013  . 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 Hidradenitis suppurativa 12/22/2015  . Hot flashes due to surgical menopause 12/22/2015  . Hyperlipidemia 12/22/2015  . Essential hypertension, benign 12/22/2015   Resolved Ambulatory Problems    Diagnosis Date Noted  . No Resolved Ambulatory Problems   Past Medical History  Diagnosis Date  . Depression   . Chicken pox   . Allergy   . Incontinence of urine   . UTI (lower urinary tract infection)    .Marland Kitchen Family History  Problem Relation Age of Onset  . Hyperlipidemia Mother   . Heart disease Father 67    CAD  . Hypertension Father   . Hypertension Brother   . Cancer Maternal Grandfather 55    Stomach Cancer  . Cancer Paternal Grandmother 33    Uterine Cancer  . Stroke Paternal Grandfather 41    CVA  . Cancer Maternal Grandmother    .Marland Kitchen Social History   Social History  . Marital Status: Married    Spouse Name: Kayla Hood  . Number of Children: 1  . Years of Education: 14   Occupational History  . 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 Smoking status: Former Smoker -- 0.25 packs/day for 5 years    Types: Cigarettes    Quit date: 03/21/2000  . Smokeless tobacco:  Never Used  . Alcohol Use: No     Comment: Occasionally   . Drug Use: No  . Sexual Activity: Yes   Other Topics Concern  . Not on file   Social History Narrative   Patient had her cholesterol checked in 06/2015 and was slightly elevated at LDL 129 and triglycerides 164. She has not started any over-the-counter treatment with fish oil. She does not like the way it makes her burp. 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 She would like recheck today.  It has been 3 years since last mammogram. Needs mammogram. Patient only has one ovary but her last Pap smear was 2013. She recently has had more hot flash symptoms. She is not on any oral estrogen but only sagittal estrogen cream to help with vaginal dryness.  Patient continues to be on Ambien 5 mg for sleep at bedtime. She tried to discontinue this. She did not have any problems discontinuing it but could not sleep without it. She did not sleep for 2 weeks. Since starting she feels much better and wakes up rested.  Hypertension-patient has a diagnosis of hypertension and treated with 2.5 mg of Norvasc. She has not taken this. She denies any chest pain, palpitations, headaches, dizziness or any vision changes.  She had some recent  worsening anxiety and depression in the last 6 months. She has completed a six-month counseling session. She feels much better. She is on Lexapro 10 mg daily and well controlled. She tried going up to 20 mg but it made her feel too irritable. She rarely uses lorazepam but only as needed.  Pt also has 1 tender papule under left axilla. She tends to get these lesions off and on. She also has one under left breast as well. She get pus out multiple times a week.     Review of Systems  All other systems reviewed and are negative.      Objective:   Physical Exam  Constitutional: She is oriented to person, place, and time. She appears well-developed and well-nourished.  HENT:  Head:  Normocephalic and atraumatic.  Cardiovascular: Normal rate, regular rhythm and normal heart sounds.   Pulmonary/Chest: Effort normal and breath sounds normal. She has no wheezes.  Neurological: She is alert and oriented to person, place, and time.  Skin: Skin is dry.     Psychiatric: She has a normal mood and affect. Her behavior is normal.          Assessment & Plan:  dyslipidemia- will order lipid to recheck. Discussed red yeast rice and possible to consider vascepa for TG elevation.   HTN- rechecked and still elevated. will add back norvasc 2.5mg  recheck 1 month BP. Discussed weight loss and exercise.   Mammogram ordered.   Come back for pap.   Hot flashes- TSH and FSH to recheck. Printed order to have saliva hormone testing done.   Vaginal dryness- estrace to continue once a week for symptom control.   hidradenitis suppurativa- treated with doxycycline for 10 days. HO given for prevention. Use dial soap. Follow up as needed.   Insomnia- refilled ambien daily.   Anxiety and depression- refilled lexapro. Continue to use ativan as needed. Follow up in 6 months.

## 2015-12-23 LAB — FOLLICLE STIMULATING HORMONE: FSH: 50.7 m[IU]/mL

## 2015-12-28 ENCOUNTER — Telehealth: Payer: Self-pay

## 2015-12-28 NOTE — Telephone Encounter (Signed)
Patient is interested in hormone replacement.  Please advise.  Patient would like to try red yeast rice for now.

## 2015-12-28 NOTE — Telephone Encounter (Signed)
-----   Message from Donella Stade, [place] sent at 12/24/2015  7:19 AM EST ----- Call pt: Kayla Hood is 52 in post-menopausal range. Thyroid is on the lower range meaning a little more thyroid hormone circulating but this is stable from past years.  LdL continues to increase to 161 from 129. TG up a bit as well at 186. HDL looks good. I know we discussed statin at visit but if not willing to try I suggest considering red yeast rice first and then recheck in 6 months to year if still elevated or higher need to consider statin. Low fat diet and weight loss also may help.  Kidney, liver, glucose look great.

## 2015-12-30 NOTE — Telephone Encounter (Signed)
I usually start with a pil once a day. Make sure pt is aware of some increased risk of stroke, blood clots, heart attack and cancer. We would only want you on it until symptoms improve for 2-5 years then come back off. If you would like to discuss in person please make appt. If ready to start let me know.

## 2016-01-18 ENCOUNTER — Encounter: Payer: Self-pay | Admitting: Physician Assistant

## 2016-01-18 ENCOUNTER — Ambulatory Visit (INDEPENDENT_AMBULATORY_CARE_PROVIDER_SITE_OTHER): Payer: BLUE CROSS/BLUE SHIELD | Admitting: Physician Assistant

## 2016-01-18 VITALS — BP 136/86 | HR 64 | Ht 67.5 in | Wt 184.0 lb

## 2016-01-18 DIAGNOSIS — R300 Dysuria: Secondary | ICD-10-CM

## 2016-01-18 DIAGNOSIS — L732 Hidradenitis suppurativa: Secondary | ICD-10-CM | POA: Diagnosis not present

## 2016-01-18 LAB — POCT URINALYSIS DIPSTICK
Bilirubin, UA: NEGATIVE
Glucose, UA: NEGATIVE
Ketones, UA: NEGATIVE
Leukocytes, UA: NEGATIVE
Nitrite, UA: NEGATIVE
PH UA: 7
PROTEIN UA: NEGATIVE
RBC UA: NEGATIVE
SPEC GRAV UA: 1.01
UROBILINOGEN UA: 0.2

## 2016-01-18 MED ORDER — OSPEMIFENE 60 MG PO TABS: 1.0000 | ORAL_TABLET | Freq: Every day | ORAL | Status: DC

## 2016-01-18 MED ORDER — DOXYCYCLINE HYCLATE 100 MG PO TABS: 100.0000 mg | ORAL_TABLET | Freq: Two times a day (BID) | ORAL | Status: DC

## 2016-01-18 MED ORDER — TRETINOIN 0.01 % EX GEL: Freq: Every day | CUTANEOUS | Status: DC

## 2016-01-18 NOTE — Progress Notes (Signed)
   Subjective:    Patient ID: Kayla Hood, female    DOB: 22-Jul-1966, 50 y.o.   MRN: [id number]  HPI  Patient is a 50 year old female who presents to the clinic with abscess in her left axilla that she drained a few days ago. It is still very tender and red. She has history of hidradenitis suprapubic area. She is not doing anything to prevent this except keeping the area as clean and dry as she can. She does sweat a lot outside when she works. No fever, chills, nausea.  Patient has had some dysuria and a little bit of burning for the last 2 weeks. She denies any itching or discharge. She does have a history of vaginal dryness. She denies any abdominal pain or flank pain. She has not done anything to make better. She denies any fever, chills, body aches. She is not using her Estrace because she does not like the application and the way it makes her feel moist all the time.     Review of Systems  All other systems reviewed and are negative.      Objective:   Physical Exam  Constitutional: She is oriented to person, place, and time. She appears well-developed and well-nourished.  HENT:  Head: Normocephalic and atraumatic.  Cardiovascular: Normal rate, regular rhythm and normal heart sounds.   Pulmonary/Chest: Effort normal and breath sounds normal.  Neurological: She is alert and oriented to person, place, and time.  Skin:     Psychiatric: She has a normal mood and affect. Her behavior is normal.          Assessment & Plan:  hidradenitis suppurativa- infected today. She has drained at home. Treated with doxycycline.  Sent retin A to apply at night to prevent reoccurance. Derm appt in feb. HO given on keeping clean.   Dysuria- .. Results for orders placed or performed in visit on 01/18/16  POCT urinalysis dipstick  Result Value Ref Range   Color, UA yellow    Clarity, UA clear    Glucose, UA neg    Bilirubin, UA neg    Ketones, UA neg    Spec Grav, UA 1.010    Blood, UA  neg    pH, UA 7.0    Protein, UA neg    Urobilinogen, UA 0.2    Nitrite, UA neg    Leukocytes, UA Negative Negative   Will culture. Urine looks great today. Consider increase estrace to see if vaginal dryness could be causing. Discussed coconut oil for vaginal dryness. She doesn't like application of estrace. Will try ospemifene. Follow up as needed.

## 2016-01-18 NOTE — Patient Instructions (Addendum)
Doxycycline if needed printed.  Start osphena.  Try acne cream under right axilla to see if prevent boil.   Hidradenitis Suppurativa Hidradenitis suppurativa is a long-term (chronic) skin disease that starts with blocked sweat glands or hair follicles. Bacteria may grow in these blocked openings of your skin. Hidradenitis suppurativa is like a severe form of acne that develops in areas of your body where acne would be unusual. It is most likely to affect the areas of your body where skin rubs against skin and becomes moist. This includes your:  Underarms.  Groin.  Genital areas.  Buttocks.  Upper thighs.  Breasts. Hidradenitis suppurativa may start out with small pimples. The pimples can develop into deep sores that break open (rupture) and drain pus. Over time your skin may thicken and become scarred. Hidradenitis suppurativa cannot be passed from person to person.  CAUSES  The exact cause of hidradenitis suppurativa is not known. This condition may be due to:  Female and female hormones. The condition is rare before and after puberty.  An overactive body defense system (immune system). Your immune system may overreact to the blocked hair follicles or sweat glands and cause swelling and pus-filled sores. RISK FACTORS You may have a higher risk of hidradenitis suppurativa if you:  Are a woman.  Are between ages 64 and 15.  Have a family history of hidradenitis suppurativa.  Have a personal history of acne.  Are overweight.  Smoke.  Take the drug lithium. SIGNS AND SYMPTOMS  The first signs of an outbreak are usually painful skin bumps that look like pimples. As the condition progresses:  Skin bumps may get bigger and grow deeper into the skin.  Bumps under the skin may rupture and drain smelly pus.  Skin may become itchy and infected.  Skin may thicken and scar.  Drainage may continue through tunnels under the skin (fistulas).  Walking and moving your arms can  become painful. DIAGNOSIS  Your health care provider may diagnose hidradenitis suppurativa based on your medical history and your signs and symptoms. A physical exam will also be done. You may need to see a health care provider who specializes in skin diseases (dermatologist). You may also have tests done to confirm the diagnosis. These can include:  Swabbing a sample of pus or drainage from your skin so it can be sent to the lab and tested for infection.  Blood tests to check for infection. TREATMENT  The same treatment will not work for everybody with hidradenitis suppurativa. Your treatment will depend on how severe your symptoms are. You may need to try several treatments to find what works best for you. Part of your treatment may include cleaning and bandaging (dressing) your wounds. You may also have to take medicines, such as the following:  Antibiotics.  Acne medicines.  Medicines to block or suppress the immune system.  A diabetes medicine (metformin) is sometimes used to treat this condition.  For women, birth control pills can sometimes help relieve symptoms. You may need surgery if you have a severe case of hidradenitis suppurativa that does not respond to medicine. Surgery may involve:   Using a laser to clear the skin and remove hair follicles.  Opening and draining deep sores.  Removing the areas of skin that are diseased and scarred. HOME CARE INSTRUCTIONS  Learn as much as you can about your disease, and work closely with your health care providers.  Take medicines only as directed by your health care provider.  If you were prescribed an antibiotic medicine, finish it all even if you start to feel better.  If you are overweight, losing weight may be very helpful. Try to reach and maintain a healthy weight.  Do not use any tobacco products, including cigarettes, chewing tobacco, or electronic cigarettes. If you need help quitting, ask your health care  provider.  Do not shave the areas where you get hidradenitis suppurativa.  Do not wear deodorant.  Wear loose-fitting clothes.  Try not to overheat and get sweaty.  Take a daily bleach bath as directed by your health care provider.  Fill your bathtub halfway with water.  Pour in  cup of unscented household bleach.  Soak for 5-10 minutes.  Cover sore areas with a warm, clean washcloth (compress) for 5-10 minutes. SEEK MEDICAL CARE IF:   You have a flare-up of hidradenitis suppurativa.  You have chills or a fever.  You are having trouble controlling your symptoms at home.   This information is not intended to replace advice given to you by your health care provider. Make sure you discuss any questions you have with your health care provider.   Document Released: 07/25/2004 Document Revised: 01/01/2015 Document Reviewed: 03/13/2014 Elsevier Interactive Patient Education 2016 [employer].   Hyperthyroidism Hyperthyroidism is when the thyroid is too active (overactive). Your thyroid is a large gland that is located in your neck. The thyroid helps to control how your body uses food (metabolism). When your thyroid is overactive, it produces too much of a hormone called thyroxine.  CAUSES Causes of hyperthyroidism may include:  Graves disease. This is when your immune system attacks the thyroid gland. This is the most common cause.  Inflammation of the thyroid gland.  Tumor in the thyroid gland or somewhere else.  Excessive use of thyroid medicines, including:  Prescription thyroid supplement.  Herbal supplements that mimic thyroid hormones.  Solid or fluid-filled lumps within your thyroid gland (thyroid nodules).  Excessive ingestion of iodine. RISK FACTORS  Being female.  Having a family history of thyroid conditions. SIGNS AND SYMPTOMS Signs and symptoms of hyperthyroidism may include:  Nervousness.  Inability to tolerate heat.  Unexplained weight  loss.  Diarrhea.  Change in the texture of hair or skin.  Heart skipping beats or making extra beats.  Rapid heart rate.  Loss of menstruation.  Shaky hands.  Fatigue.  Restlessness.  Increased appetite.  Sleep problems.  Enlarged thyroid gland or nodules. DIAGNOSIS  Diagnosis of hyperthyroidism may include:  Medical history and physical exam.  Blood tests.  Ultrasound tests. TREATMENT Treatment may include:  Medicines to control your thyroid.  Surgery to remove your thyroid.  Radiation therapy. HOME CARE INSTRUCTIONS   Take medicines only as directed by your health care provider.  Do not use any tobacco products, including cigarettes, chewing tobacco, or electronic cigarettes. If you need help quitting, ask your health care provider.  Do not exercise or do physical activity until your health care provider approves.  Keep all follow-up appointments as directed by your health care provider. This is important. SEEK MEDICAL CARE IF:  Your symptoms do not get better with treatment.  You have fever.  You are taking thyroid replacement medicine and you:  Have depression.  Feel mentally and physically slow.  Have weight gain. SEEK IMMEDIATE MEDICAL CARE IF:   You have decreased alertness or a change in your awareness.  You have abdominal pain.  You feel dizzy.  You have a rapid heartbeat.  You have  an irregular heartbeat.   This information is not intended to replace advice given to you by your health care provider. Make sure you discuss any questions you have with your health care provider.   Document Released: 12/11/2005 Document Revised: 01/01/2015 Document Reviewed: 04/28/2014 Elsevier Interactive Patient Education [employer].

## 2016-01-19 ENCOUNTER — Telehealth: Payer: Self-pay | Admitting: Physician Assistant

## 2016-01-19 NOTE — Telephone Encounter (Signed)
Received fax on Tretinoin sent through cover my meds waiting on authorization. - CF

## 2016-01-20 LAB — URINE CULTURE
Colony Count: NO GROWTH
Organism ID, Bacteria: NO GROWTH

## 2016-01-24 ENCOUNTER — Other Ambulatory Visit: Payer: Self-pay | Admitting: Physician Assistant

## 2016-01-24 MED ORDER — CLINDAMYCIN PHOSPHATE 1 % EX GEL: Freq: Two times a day (BID) | CUTANEOUS | Status: DC

## 2016-01-24 NOTE — Progress Notes (Signed)
Call pt: let pt know that patient retin a not approved need to try clindamycin topical first twice a day as needed.

## 2016-01-27 NOTE — Progress Notes (Signed)
Left message advising of recommendations.  

## 2016-02-02 NOTE — Telephone Encounter (Signed)
Medication was denied. - CF 

## 2016-02-07 ENCOUNTER — Encounter: Payer: Self-pay | Admitting: Physician Assistant

## 2016-02-07 ENCOUNTER — Ambulatory Visit (INDEPENDENT_AMBULATORY_CARE_PROVIDER_SITE_OTHER): Payer: BLUE CROSS/BLUE SHIELD | Admitting: Physician Assistant

## 2016-02-07 ENCOUNTER — Telehealth: Payer: Self-pay | Admitting: Physician Assistant

## 2016-02-07 VITALS — BP 127/75 | HR 73 | Ht 67.5 in | Wt 185.0 lb

## 2016-02-07 DIAGNOSIS — Z111 Encounter for screening for respiratory tuberculosis: Secondary | ICD-10-CM

## 2016-02-07 DIAGNOSIS — E8941 Symptomatic postprocedural ovarian failure: Secondary | ICD-10-CM

## 2016-02-07 DIAGNOSIS — N951 Menopausal and female climacteric states: Secondary | ICD-10-CM

## 2016-02-07 DIAGNOSIS — N958 Other specified menopausal and perimenopausal disorders: Secondary | ICD-10-CM | POA: Diagnosis not present

## 2016-02-07 NOTE — Telephone Encounter (Signed)
Please call pharmacy and see if estrace or premarin comes in vaginal capsules/suppositories?

## 2016-02-07 NOTE — Progress Notes (Signed)
   Subjective:    Patient ID: Kayla Hood, female    DOB: 05/09/1966, 50 y.o.   MRN: [id number]  HPI Pt presents to the clinic for PPD and to get forms for work filled out.   She is on osphene and doing much better with menopausal symptoms. She does feel like she is eating more and wonders if medication. Not using estrace because does not like cream. Would like to know if there is a suppository.    Review of Systems  All other systems reviewed and are negative.      Objective:   Physical Exam  Constitutional: She is oriented to person, place, and time. She appears well-developed and well-nourished.  HENT:  Head: Normocephalic and atraumatic.  Cardiovascular: Normal rate, regular rhythm and normal heart sounds.   Pulmonary/Chest: Effort normal and breath sounds normal.  Neurological: She is alert and oriented to person, place, and time.  Skin: Skin is dry.  Psychiatric: She has a normal mood and affect. Her behavior is normal.          Assessment & Plan:  PPD given today. Instructed to have read in 48-72 hours.   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 I do not see any diagnosis that would keep her from working with children.   Vaginal dryness/hot flashes-osphene to continue since helping symptoms.  Reassured should not be causing weight gain. She has only gained 1lb since last visit. Will call and see if estrace comes in suppository for easier use.

## 2016-02-09 ENCOUNTER — Ambulatory Visit (INDEPENDENT_AMBULATORY_CARE_PROVIDER_SITE_OTHER): Payer: BLUE CROSS/BLUE SHIELD | Admitting: Physician Assistant

## 2016-02-09 VITALS — BP 125/88 | HR 65

## 2016-02-09 DIAGNOSIS — Z7689 Persons encountering health services in other specified circumstances: Secondary | ICD-10-CM

## 2016-02-09 DIAGNOSIS — Z111 Encounter for screening for respiratory tuberculosis: Secondary | ICD-10-CM

## 2016-02-09 LAB — TB SKIN TEST
Induration: 0 mm
TB Skin Test: NEGATIVE

## 2016-02-09 NOTE — Progress Notes (Signed)
Pt came into clinic today for PPD reading. Pt PPD was negative. There was a form she had to have completed, this was finished and the Pt took with her. No further questions/concerns.

## 2016-02-10 NOTE — Telephone Encounter (Signed)
Pharm said it just comes in a cream.

## 2016-02-11 NOTE — Telephone Encounter (Signed)
Please call pt and let her know we checked and does not come in capsule/suppository form.

## 2016-02-11 NOTE — Telephone Encounter (Signed)
Notified patient that it is just in cream form.

## 2016-05-24 ENCOUNTER — Other Ambulatory Visit: Payer: Self-pay | Admitting: Physician Assistant

## 2016-06-28 ENCOUNTER — Ambulatory Visit (INDEPENDENT_AMBULATORY_CARE_PROVIDER_SITE_OTHER): Payer: BLUE CROSS/BLUE SHIELD | Admitting: Physician Assistant

## 2016-06-28 ENCOUNTER — Ambulatory Visit (INDEPENDENT_AMBULATORY_CARE_PROVIDER_SITE_OTHER): Payer: BLUE CROSS/BLUE SHIELD

## 2016-06-28 ENCOUNTER — Encounter: Payer: Self-pay | Admitting: Physician Assistant

## 2016-06-28 VITALS — BP 146/88 | HR 72 | Ht 67.5 in | Wt 185.0 lb

## 2016-06-28 DIAGNOSIS — R946 Abnormal results of thyroid function studies: Secondary | ICD-10-CM | POA: Diagnosis not present

## 2016-06-28 DIAGNOSIS — R2231 Localized swelling, mass and lump, right upper limb: Secondary | ICD-10-CM

## 2016-06-28 DIAGNOSIS — F418 Other specified anxiety disorders: Secondary | ICD-10-CM | POA: Diagnosis not present

## 2016-06-28 DIAGNOSIS — N951 Menopausal and female climacteric states: Secondary | ICD-10-CM

## 2016-06-28 DIAGNOSIS — R7989 Other specified abnormal findings of blood chemistry: Secondary | ICD-10-CM

## 2016-06-28 LAB — T4, FREE: Free T4: 1.1 ng/dL (ref 0.8–1.8)

## 2016-06-28 LAB — TSH: TSH: 1.21 m[IU]/L

## 2016-06-28 MED ORDER — ESTRADIOL 2 MG VA RING: 2.0000 mg | VAGINAL_RING | VAGINAL | Status: DC

## 2016-06-28 MED ORDER — ZOLPIDEM TARTRATE 10 MG PO TABS: 10.0000 mg | ORAL_TABLET | Freq: Every day | ORAL | Status: DC

## 2016-06-28 MED ORDER — ESCITALOPRAM OXALATE 10 MG PO TABS
ORAL_TABLET | ORAL | Status: DC
Start: 2016-06-28 — End: 2017-05-08

## 2016-06-28 MED ORDER — VALACYCLOVIR HCL 1 G PO TABS: ORAL_TABLET | ORAL | Status: DC

## 2016-06-28 NOTE — Progress Notes (Signed)
   Subjective:    Patient ID: Kayla Hood, female    DOB: [birth year], 50 y.o.   MRN: [id number]  HPI Pt presents to the clinic for medication refill.   Depression and anxiety doing great. On lexapro. No suidical or homicidal thoughts.   Has a non-tender nodule on right pinky. Wonders what it could be. No trauma.   Last thyroid test was trending down and wanted to recheck.   Vaginal estrogen is messy and wants another option.    Review of Systems  All other systems reviewed and are negative.      Objective:   Physical Exam  Constitutional: She is oriented to person, place, and time. She appears well-developed and well-nourished.  HENT:  Head: Normocephalic and atraumatic.  Cardiovascular: Normal rate, regular rhythm and normal heart sounds.   Pulmonary/Chest: Effort normal and breath sounds normal. She has no wheezes.  Musculoskeletal:  PiP nodule of right 5th metatarsal. No tenderness or erythema.  Neurological: She is alert and oriented to person, place, and time.  Skin: Skin is dry.  Psychiatric: She has a normal mood and affect. Her behavior is normal.          Assessment & Plan:  Depression with anxiety- PHQ-9 was 3. GAD-7 was 1. Refilled lexapro for 1 year.   Nodule of right 5th metatarsal- will get xray today to evaluate for any bony lesions. Xray normal. Discussed with patient knuckle pad. Benign finding. Follow up if bothersome.   Abnormal thyroid test- will recheck and make sure not trending down.   Vaginal dryness- stop estrace. Will try estring. Pt wants a cleanier option.

## 2016-07-19 ENCOUNTER — Ambulatory Visit: Payer: Self-pay | Admitting: Physician Assistant

## 2016-08-16 ENCOUNTER — Ambulatory Visit (INDEPENDENT_AMBULATORY_CARE_PROVIDER_SITE_OTHER): Payer: BLUE CROSS/BLUE SHIELD | Admitting: Physician Assistant

## 2016-08-16 ENCOUNTER — Encounter: Payer: Self-pay | Admitting: Physician Assistant

## 2016-08-16 VITALS — BP 144/89 | HR 84 | Ht 67.5 in | Wt 190.0 lb

## 2016-08-16 DIAGNOSIS — R2233 Localized swelling, mass and lump, upper limb, bilateral: Secondary | ICD-10-CM | POA: Diagnosis not present

## 2016-08-16 DIAGNOSIS — F418 Other specified anxiety disorders: Secondary | ICD-10-CM

## 2016-08-16 NOTE — Patient Instructions (Signed)
Cut lexapro in half for 6 weeks then in half again for 4 weeks then stop.

## 2016-08-16 NOTE — Progress Notes (Signed)
   Subjective:    Patient ID: Kayla Hood, female    DOB: 07-06-1966, 50 y.o.   MRN: [id number]  HPI patientIs a 50 year old female who presents to the clinic wanting to taper off her Lexapro. She has already cut in half for the last 3 days. She has not noticed any changes. She feels like she's had a good place in life. She's been well controlled for over a year. She denies any suicidal thoughts.  A few weeks ago she discussed nodule on her pinky. We did an x-ray that was normal. Without it could be a knuckle pad. She now is getting more nodules on her hands. They're tender to touch. She has been tested for rheumatoid arthritis in the past and was negative. She denies any morning stiffness or swelling. She denies any trauma.   Review of Systems    see HPI.  Objective:   Physical Exam  Constitutional: She is oriented to person, place, and time. She appears well-developed and well-nourished.  HENT:  Head: Normocephalic and atraumatic.  Cardiovascular: Normal rate, regular rhythm and normal heart sounds.   Pulmonary/Chest: Effort normal and breath sounds normal.  Musculoskeletal:  Nodules of left pinky and bilateral middle finger both lateral DIP joints. Red and tender to touch.   Neurological: She is alert and oriented to person, place, and time.  Psychiatric: She has a normal mood and affect. Her behavior is normal.          Assessment & Plan:  Depression with anxiety- PHQ-9 was 0. GAD-7 was 0. Discussed taper down off lexapro. Follow up in 3 months.   Nodules of fingers of both hands- unclear etiology. Xray of original nodules was negative. Originally suspected knuckle pads. Presentation is not exactly like RA and tested negative in the past. Will send to rheumatology for evaluation.

## 2016-08-18 DIAGNOSIS — R2233 Localized swelling, mass and lump, upper limb, bilateral: Secondary | ICD-10-CM

## 2016-08-18 HISTORY — DX: Localized swelling, mass and lump, upper limb, bilateral: R22.33

## 2016-08-25 ENCOUNTER — Ambulatory Visit (INDEPENDENT_AMBULATORY_CARE_PROVIDER_SITE_OTHER): Payer: BLUE CROSS/BLUE SHIELD | Admitting: Physician Assistant

## 2016-08-25 ENCOUNTER — Encounter: Payer: Self-pay | Admitting: Physician Assistant

## 2016-08-25 VITALS — BP 157/68 | HR 74 | Ht 67.5 in | Wt 192.0 lb

## 2016-08-25 DIAGNOSIS — J0100 Acute maxillary sinusitis, unspecified: Secondary | ICD-10-CM | POA: Diagnosis not present

## 2016-08-25 MED ORDER — AMOXICILLIN-POT CLAVULANATE 875-125 MG PO TABS: 1.0000 | ORAL_TABLET | Freq: Two times a day (BID) | ORAL | 0 refills | Status: DC

## 2016-08-25 MED ORDER — FLUCONAZOLE 150 MG PO TABS: 150.0000 mg | ORAL_TABLET | Freq: Once | ORAL | 0 refills | Status: AC

## 2016-08-25 NOTE — Progress Notes (Signed)
   Subjective:    Patient ID: Kayla Hood, female    DOB: 31-Dec-1965, 50 y.o.   MRN: [id number]  HPI    Review of Systems See history of present illness    Objective:   Physical Exam  Constitutional: She is oriented to person, place, and time. She appears well-developed and well-nourished.  HENT:  Head: Normocephalic and atraumatic.  TMs slightly erythematous with no blood or pus noted.  Oropharynx erythematous with postnasal drip present for swelling. No exudate.  Nasal turbinates red and swollen.  Bilateral maxillary tenderness to palpation over sinuses.  Eyes: Conjunctivae are normal. Right eye exhibits no discharge. Left eye exhibits no discharge.  Neck: Normal range of motion. Neck supple.  Bilateral enlarged nontender anterior cervical lymph nodes.  Cardiovascular: Normal rate, regular rhythm and normal heart sounds.   Pulmonary/Chest: Effort normal and breath sounds normal.  Lymphadenopathy:    She has cervical adenopathy.  Neurological: She is alert and oriented to person, place, and time.  Psychiatric: She has a normal mood and affect. Her behavior is normal.          Assessment & Plan:    Elevated blood pressure- Likely due to Sudafed usage for her sinus congestion. Advised her to stop using Sudafed.   Acute maxillary sinusitis-treated with Augmentin for 10 days. Given Diflucan if she gets a yeast infection due to her tendencies. Symptomatic care discussed. Follow-up as needed.

## 2016-08-25 NOTE — Patient Instructions (Signed)

## 2016-10-31 ENCOUNTER — Other Ambulatory Visit: Payer: Self-pay | Admitting: Physician Assistant

## 2016-11-08 ENCOUNTER — Emergency Department (INDEPENDENT_AMBULATORY_CARE_PROVIDER_SITE_OTHER)
Admission: EM | Admit: 2016-11-08 | Discharge: 2016-11-08 | Disposition: A | Discharge status: Home / Self Care | Payer: BLUE CROSS/BLUE SHIELD | Source: Home / Self Care | Attending: Family Medicine | Admitting: Family Medicine

## 2016-11-08 ENCOUNTER — Encounter: Payer: Self-pay | Admitting: Obstetrics & Gynecology

## 2016-11-08 ENCOUNTER — Ambulatory Visit (INDEPENDENT_AMBULATORY_CARE_PROVIDER_SITE_OTHER): Payer: BLUE CROSS/BLUE SHIELD | Admitting: Obstetrics & Gynecology

## 2016-11-08 ENCOUNTER — Encounter: Payer: Self-pay | Admitting: *Deleted

## 2016-11-08 VITALS — BP 157/102 | HR 82 | Ht 67.0 in | Wt 196.0 lb

## 2016-11-08 DIAGNOSIS — R102 Pelvic and perineal pain: Secondary | ICD-10-CM | POA: Diagnosis not present

## 2016-11-08 DIAGNOSIS — I10 Essential (primary) hypertension: Secondary | ICD-10-CM

## 2016-11-08 DIAGNOSIS — Z01411 Encounter for gynecological examination (general) (routine) with abnormal findings: Secondary | ICD-10-CM | POA: Diagnosis not present

## 2016-11-08 DIAGNOSIS — Z124 Encounter for screening for malignant neoplasm of cervix: Secondary | ICD-10-CM

## 2016-11-08 DIAGNOSIS — N889 Noninflammatory disorder of cervix uteri, unspecified: Secondary | ICD-10-CM

## 2016-11-08 DIAGNOSIS — R21 Rash and other nonspecific skin eruption: Secondary | ICD-10-CM | POA: Diagnosis not present

## 2016-11-08 DIAGNOSIS — Z1151 Encounter for screening for human papillomavirus (HPV): Secondary | ICD-10-CM

## 2016-11-08 DIAGNOSIS — Z01419 Encounter for gynecological examination (general) (routine) without abnormal findings: Secondary | ICD-10-CM

## 2016-11-08 HISTORY — DX: Essential (primary) hypertension: I10

## 2016-11-08 MED ORDER — LOSARTAN POTASSIUM 25 MG PO TABS: 25.0000 mg | ORAL_TABLET | Freq: Every day | ORAL | 1 refills | Status: DC

## 2016-11-08 MED ORDER — TRIAMCINOLONE ACETONIDE 0.025 % EX OINT: TOPICAL_OINTMENT | CUTANEOUS | 0 refills | Status: DC

## 2016-11-08 NOTE — ED Provider Notes (Signed)
[facility]    CSN: [id number] Arrival date & time: 11/08/16  1320     History   Chief Complaint Chief Complaint  Patient presents with  . Hypertension    HPI Kayla Hood is a 50 y.o. female.   Patient has a history of hypertension controlled with amlodipine but she discontinued it two months ago because of adverse effects (increased sweating).  While she was in women's health clinic today, her pressure was elevated.  She feels well otherwise.  She has a family history of hypertension and ASCVD.   The history is provided by the patient.  Hypertension  This is a chronic problem. The problem has not changed since onset.Pertinent negatives include no chest pain, no abdominal pain, no headaches and no shortness of breath. Nothing aggravates the symptoms. Nothing relieves the symptoms. Treatments tried: amlodipine. The treatment provided significant relief.    Past Medical History:  Diagnosis Date  . Allergy   . Chicken pox   . Depression   . Hypertension   . Incontinence of urine   . UTI (lower urinary tract infection)     Patient Active Problem List   Diagnosis Date Noted  . Nodule of finger of both hands 08/18/2016  . Hidradenitis suppurativa 12/22/2015  . Hot flashes due to surgical menopause 12/22/2015  . Hyperlipidemia 12/22/2015  . Essential hypertension, benign 12/22/2015  . Vaginal dryness, menopausal 12/22/2015  . Anxiety 12/22/2015  . Screening for diabetes mellitus 12/22/2015  . Depression with anxiety 07/08/2015  . Endometriosis 07/08/2015  . Fibroid 07/08/2015  . 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 Anxiety state 06/07/2015  . Viral URI 03/09/2015  . Facial edema 02/04/2014  . Hives 01/26/2014  . RLQ abdominal pain 01/26/2014  . Exposure to blood or body fluid 11/24/2013  . Tendonitis of shoulder 09/01/2013  . Left hip pain 06/28/2013  . Palpitations 03/21/2013  . Abnormal chest x-ray 03/03/2013  . Irregular heart beat 03/03/2013  .  Sleep disturbance 03/03/2013  . General medical exam 03/03/2013    Past Surgical History:  Procedure Laterality Date  . ABLATION    . CERVICAL FUSION  2012  . pelvic laproscopic surgeries     4 prior to 2008    OB History    Gravida Para Term Preterm AB Living   3 1     2 1    SAB TAB Ectopic Multiple Live Births   2               Home Medications    Prior to Admission medications   Medication Sig Start Date End Date Taking? Authorizing Provider  escitalopram (LEXAPRO) 10 MG tablet TAKE 1 TABLET(10 MG) BY MOUTH DAILY 06/28/16  Yes Jade L Breeback, PA-C  LORazepam (ATIVAN) 0.5 MG tablet Take 1 tablet (0.5 mg total) by mouth 2 (two) times daily as needed for anxiety. 06/23/15  Yes Rubbie Battiest, NP  zolpidem (AMBIEN) 10 MG tablet Take 1 tablet (10 mg total) by mouth at bedtime. 06/28/16  Yes Jade L Breeback, PA-C  estradiol (ESTRING) 2 MG vaginal ring Place 2 mg vaginally every 3 (three) months. follow package directions 06/28/16   Donella Stade, PA-C  ibuprofen (ADVIL,MOTRIN) 200 MG tablet Take 600 mg by mouth every 6 (six) hours as needed for pain.    Historical Provider, MD  losartan (COZAAR) 25 MG tablet Take 1 tablet (25 mg total) by mouth daily. 11/08/16   Kandra Nicolas, MD  triamcinolone (KENALOG) 0.025 % ointment  Apply a thin leyer toa area BID for 3 days then once daily 11/08/16   Guss Bunde, MD  valACYclovir (VALTREX) 1000 MG tablet Take 2 tablets twice a day for 1 day. Patient not taking: Reported on 11/08/2016 06/28/16   Donella Stade, PA-C    Family History Family History  Problem Relation Age of Onset  . Hyperlipidemia Mother   . Heart disease Father 65    CAD  . Hypertension Father   . Hypertension Brother   . Cancer Maternal Grandfather 43    Stomach Cancer  . Cancer Paternal Grandmother 41    Uterine Cancer  . Stroke Paternal Grandfather 85    CVA  . Cancer Maternal Grandmother     Social History Social History  Substance Use Topics  . Smoking  status: Former Smoker    Packs/day: 0.25    Years: 5.00    Types: Cigarettes    Quit date: 03/21/2000  . Smokeless tobacco: Never Used  . Alcohol use No     Comment: Occasionally      Allergies   Promethazine and Phenergan [promethazine hcl]   Review of Systems Review of Systems  Constitutional: Negative.   HENT: Negative.   Eyes: Negative.   Respiratory: Negative for shortness of breath.   Cardiovascular: Negative for chest pain, palpitations and leg swelling.  Gastrointestinal: Negative for abdominal pain.  Genitourinary: Negative.   Musculoskeletal: Negative.   Skin: Negative.   Neurological: Negative for headaches.  All other systems reviewed and are negative.    Physical Exam Triage Vital Signs ED Triage Vitals  Enc Vitals Group     BP 11/08/16 1400 (!) 186/119     Pulse Rate 11/08/16 1400 97     Resp 11/08/16 1400 18     Temp 11/08/16 1400 98.7 F (37.1 C)     Temp Source 11/08/16 1400 Oral     SpO2 11/08/16 1400 99 %     Weight 11/08/16 1401 197 lb (89.4 kg)     Height 11/08/16 1401 5\' 7"  (1.702 m)     Head Circumference --      Peak Flow --      Pain Score 11/08/16 1402 0     Pain Loc --      Pain Edu? --      Excl. in [place]? --    No data found.   Updated Vital Signs BP (!) 186/119 (BP Location: Left Arm)   Pulse 97   Temp 98.7 F (37.1 C) (Oral)   Resp 18   Ht 5\' 7"  (1.702 m)   Wt 197 lb (89.4 kg)   SpO2 99%   BMI 30.85 kg/m   Visual Acuity Right Eye Distance:   Left Eye Distance:   Bilateral Distance:    Right Eye Near:   Left Eye Near:    Bilateral Near:     Physical Exam Nursing notes and Vital Signs reviewed. Appearance:  Patient appears stated age, and in no acute distress Eyes:  Pupils are equal, round, and reactive to light and accomodation.  Extraocular movement is intact.  Conjunctivae are not inflamed.  Fundi benign. Ears:   Normal  Nose:  Normal Pharynx:  Normal Neck:  Supple.  No adenopathy  Lungs:  Clear to  auscultation.  Breath sounds are equal.  Moving air well. Heart:  Regular rate and rhythm without murmurs, rubs, or gallops.  Abdomen:  Nontender without masses or hepatosplenomegaly.  Bowel sounds are present.  No CVA  or flank tenderness.  Extremities:  No edema.  Distal pulses intact. Skin:  No rash present.    UC Treatments / Results  Labs (all labs ordered are listed, but only abnormal results are displayed) Labs Reviewed - No data to display  EKG  EKG Interpretation None       Radiology No results found.  Procedures Procedures (including critical care time)  Medications Ordered in UC Medications - No data to display   Initial Impression / Assessment and Plan / UC Course  I have reviewed the triage vital signs and the nursing notes.  Pertinent labs & imaging results that were available during my care of the patient were reviewed by me and considered in my medical decision making (see chart for details).  Clinical Course   Begin trial of losartan 25mg  daily. Monitor blood pressure more frequently at different times of day and record on a calendar. Followup with Family Doctor in 70 to 14 days.     Final Clinical Impressions(s) / UC Diagnoses   Final diagnoses:  Essential hypertension    New Prescriptions New Prescriptions   LOSARTAN (COZAAR) 25 MG TABLET    Take 1 tablet (25 mg total) by mouth daily.     Kandra Nicolas, MD 11/11/16 [phone]

## 2016-11-08 NOTE — Progress Notes (Signed)
a 

## 2016-11-08 NOTE — ED Triage Notes (Signed)
Pt c/o high blood pressure today at her visit in women's health clinic. She reports that she stopped her antihypertensive medicine 2 months ago because she didn't like the way it made her feel and reports that her BP readings have been normal.

## 2016-11-08 NOTE — Discharge Instructions (Signed)
Monitor blood pressure more frequently at different times of day and record on a calendar.

## 2016-11-10 ENCOUNTER — Telehealth: Payer: Self-pay | Admitting: *Deleted

## 2016-11-10 ENCOUNTER — Encounter: Payer: Self-pay | Admitting: Obstetrics & Gynecology

## 2016-11-10 NOTE — Telephone Encounter (Signed)
Spoke to pt she reports that her BP was minimally improved today. She will continue to keep a record of her BP readings. She has an appt next wk with her PCP.

## 2016-11-10 NOTE — Progress Notes (Addendum)
Subjective:    Kayla Hood is a 50 y.o. female who presents for an annual exam. The patient complains of bloating and pelvic pain.  Pt has known endometriosis and has 3 excisional procedures.  She also had her right ovary removed.  She was told there was extensive scarring and a hysterectomy would be very difficult.  Pt went into menopause shortly after right oophorectomy. The patient is not on HRT but does take the estring for GU atrophy.  This has helped and she would like to continue.  Pt worried that bloating could be due to some malignancy.  Pt has not fully tried elimination diet to see if foods contribute to symptoms.  Pt is negative for celiac disease.    Pt has never had a DEXA.    Menstrual History: OB History    Gravida Para Term Preterm AB Living   3 1     2 1    SAB TAB Ectopic Multiple Live Births   2              Pti in menopasue for 5 years. No LMP recorded. Patient has had an ablation.    The following portions of the patient's history were reviewed and updated as appropriate: allergies, current medications, past family history, past medical history, past social history, past surgical history and problem list.  Review of Systems Pertinent items noted in HPI and remainder of comprehensive ROS otherwise negative.    Objective:      Vitals:   11/08/16 1045  BP: (!) 157/102  Pulse: 82  Weight: 196 lb (88.9 kg)  Height: 5\' 7"  (1.702 m)   Vitals:  WNL General appearance: alert, cooperative and no distress  HEENT: Normocephalic, without obvious abnormality, atraumatic Eyes: negative Throat: lips, mucosa, and tongue normal; teeth and gums normal  Respiratory: Clear to auscultation bilaterally  CV: Regular rate and rhythm  Breasts:  Normal appearance, no masses or tenderness, no nipple retraction or dimpling  GI: Soft, non-tender; bowel sounds normal; no masses,  no organomegaly  GU: External Genitalia:  Tanner V, no lesion Urethra:  No prolapse  Mild pain over  baldder  Vagina: Pale pink, moist, no lesion Pain over in pelvic diaphragm muscles bilaterally.  Cervix: No CMT, no lesion  Uterus:  Enlarged (10 weeks approx), diffuse tenderness  Adnexa: No masses but limited due to habitus and tenderness.  Musculoskeletal: No edema, redness or tenderness in the calves or thighs  Skin: No lesions or rash  Lymphatic: Axillary adenopathy: none     Psychiatric: Normal mood and behavior    .    Assessment:    Healthy female exam.   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 2-Mammogram 3-Dexa 4-Calcium and Vit D supplementation 5-TVUS to evaluate pelvic organs as cause for bloating.  Pt has fear of cancer.  Pt understands the [place] may pick up benign lesions and be unable to fully characterize them.  This may lead to unnecessary surgery.  She will need to be referred [facility] this procedure given her history and reports of extensive scarring.   6-Pt has pain along levator muscles and could benefit from pelvic PT--Referred to [facility] 7-Kegal exercises for stress incontinence 8-Continue vaginal estrogen.  Will discuss provera challenge since this will be a  long term medication and this has not been well studied (see below)  A progestin is probably not necessary to protect against endometrial hyperplasia or cancer in women treated for vaginal atrophy with low dose preparations (ie, the regimen recommended by the manufacturer of the low dose ring [Estring] or insert [Vagifem], two to four times weekly) [28].   For the low dose estradiol ring (Estring), 60 postmenopausal women were randomly assigned to receive Estring or no treatment for 12 months [56]. Sonographic evaluation found no significant increases in endometrial thickness in either  group.   My chart message sent to patient.  Procedure  After informed consent was obtained, the pt was placed in dorsal lithotomy position.  A bivalved speculum was placed into the vagina.  The cervix was brought into view.  The cervix was cleaned with betadine x2.  There was a 2 mm mass at 6 0clock, Unsure whether Nabothian cyst, dysplasia, or distrortion of cervical tissue (?during childbirth)  We will call pt with the pathology results next week.  If the patient continues to bleed or spot longer than one week, she will need to return for further evaluation.  HTN--Pt needs to follow up with PCP. RTC 3-4 months

## 2016-11-11 ENCOUNTER — Encounter: Payer: Self-pay | Admitting: Obstetrics & Gynecology

## 2016-11-13 ENCOUNTER — Telehealth: Payer: Self-pay | Admitting: *Deleted

## 2016-11-13 LAB — CYTOLOGY - PAP
Diagnosis: NEGATIVE
HPV: NOT DETECTED

## 2016-11-13 NOTE — Telephone Encounter (Signed)
Pt notified of normal biopsy results.

## 2016-11-13 NOTE — Telephone Encounter (Signed)
-----   Message from Guss Bunde, MD sent at 11/10/2016 10:35 AM EST ----- I noticed pt's BP was high when I was doing her ntoe.  Can you call her and make sure she knows and needs to f/u with PCP (put note in epic, too, please)

## 2016-11-13 NOTE — Telephone Encounter (Signed)
-----   Message from Guss Bunde, MD sent at 11/10/2016 10:36 AM EST ----- Benign cervical biopsy.  RN to call patient

## 2016-11-13 NOTE — Telephone Encounter (Signed)
Called pt to check on her BP as when she was last here it was elevated.  She does have an appt with [facility] on 11/15/16 tp f/u with the elevated BP

## 2016-11-15 ENCOUNTER — Ambulatory Visit (HOSPITAL_COMMUNITY)
Admission: RE | Admit: 2016-11-15 | Discharge: 2016-11-15 | Disposition: A | Discharge status: Home / Self Care | Payer: BLUE CROSS/BLUE SHIELD | Source: Ambulatory Visit | Attending: Obstetrics & Gynecology | Admitting: Obstetrics & Gynecology

## 2016-11-15 ENCOUNTER — Encounter: Payer: Self-pay | Admitting: Physician Assistant

## 2016-11-15 ENCOUNTER — Ambulatory Visit (INDEPENDENT_AMBULATORY_CARE_PROVIDER_SITE_OTHER): Payer: BLUE CROSS/BLUE SHIELD | Admitting: Physician Assistant

## 2016-11-15 VITALS — BP 129/73 | HR 79 | Ht 67.0 in | Wt 193.0 lb

## 2016-11-15 DIAGNOSIS — R102 Pelvic and perineal pain: Secondary | ICD-10-CM | POA: Insufficient documentation

## 2016-11-15 DIAGNOSIS — E785 Hyperlipidemia, unspecified: Secondary | ICD-10-CM | POA: Diagnosis not present

## 2016-11-15 DIAGNOSIS — D259 Leiomyoma of uterus, unspecified: Secondary | ICD-10-CM | POA: Diagnosis not present

## 2016-11-15 DIAGNOSIS — R635 Abnormal weight gain: Secondary | ICD-10-CM | POA: Diagnosis not present

## 2016-11-15 DIAGNOSIS — I10 Essential (primary) hypertension: Secondary | ICD-10-CM | POA: Diagnosis not present

## 2016-11-15 DIAGNOSIS — Z131 Encounter for screening for diabetes mellitus: Secondary | ICD-10-CM | POA: Diagnosis not present

## 2016-11-15 MED ORDER — LOSARTAN POTASSIUM 25 MG PO TABS: 25.0000 mg | ORAL_TABLET | Freq: Every day | ORAL | 1 refills | Status: DC

## 2016-11-15 MED ORDER — ZOLPIDEM TARTRATE 10 MG PO TABS: 10.0000 mg | ORAL_TABLET | Freq: Every day | ORAL | 3 refills | Status: DC

## 2016-11-15 MED ORDER — LORCASERIN HCL 10 MG PO TABS: 1.0000 | ORAL_TABLET | Freq: Two times a day (BID) | ORAL | 1 refills | Status: DC

## 2016-11-15 NOTE — Progress Notes (Addendum)
  Subjective:    Patient ID: Kayla Hood, female    DOB: 11/06/1966, 50 y.o.   MRN: [id number]  HPI Patient is a 50 yo female following-up on her blood pressure. Patient was at her GYN a week ago and her blood pressure was elevated. Patient then went to urgent care where they treated her with Losartan. Patient was previously diagnosed with hypertension and was prescribed amlodipine; however she states that she felt like she did not need to take the medication due to not experiencing symptoms. Patient denies, chest pain, shortness or breath or change in vision.   Patient is also concerned about her recent weight gain over the last year. She has been trying to eat less carbs and a low fat diet. Patient does not exercise regularly.   Patient also states she has pressure in her ears. She denies sinus congestion, rhinorrhea or cough.   Patient also states that she wants a refill on Ambien to help her sleep.   Vitals:   11/15/16 1419  BP: 129/73  Pulse: 79    Review of Systems  HENT: Negative for congestion, hearing loss, rhinorrhea, sinus pain, sinus pressure and sore throat.   Respiratory: Negative for cough, chest tightness and shortness of breath.   Cardiovascular: Negative for chest pain and palpitations.       Objective:   Physical Exam  Constitutional: She appears well-developed and well-nourished.  HENT:  Head: Normocephalic and atraumatic.  A clump of cerumen is present in the right ear canal. TMs clear and well visualized bilaterally.   Cardiovascular: Normal rate and regular rhythm.  Exam reveals no gallop and no friction rub.   No murmur heard. Pulmonary/Chest: Effort normal and breath sounds normal. No respiratory distress. She has no wheezes. She has no rales.          Assessment & Plan:   Kayla Hood was seen today for hypertension.  Diagnoses and all orders for this visit:  Essential hypertension, benign -     losartan (COZAAR) 25 MG tablet; Take 1 tablet (25 mg  total) by mouth daily.  Hyperlipidemia LDL goal <130 -     Lipid panel  Screening for diabetes mellitus -     COMPLETE METABOLIC PANEL WITH GFR  Abnormal weight gain -     TSH -     Lorcaserin HCl 10 MG TABS; Take 1 tablet by mouth 2 (two) times daily.  Other orders -     zolpidem (AMBIEN) 10 MG tablet; Take 1 tablet (10 mg total) by mouth at bedtime.   Continue losartan for hypertension. Patient educated on the importance of taking this medication to help with blood pressure management.   Get lipid panel to re-check cholesterol. It has been a year since a lipid panel was checked. Also screen for diabetes.   Check TSH due to abnormal weight gain. Start Loraserin HCl to help with weight gain.

## 2016-11-18 ENCOUNTER — Encounter: Payer: Self-pay | Admitting: Obstetrics & Gynecology

## 2016-11-18 DIAGNOSIS — R635 Abnormal weight gain: Secondary | ICD-10-CM

## 2016-11-18 HISTORY — DX: Abnormal weight gain: R63.5

## 2016-11-18 NOTE — Addendum Note (Signed)
Addended by: Donella Stade on: 11/18/2016 01:46 AM   Modules accepted: Orders

## 2016-11-21 ENCOUNTER — Telehealth: Payer: Self-pay | Admitting: *Deleted

## 2016-11-21 NOTE — Telephone Encounter (Signed)
LM on voicemail of results of pelvic U/S.

## 2016-11-21 NOTE — Telephone Encounter (Signed)
-----   Message from Guss Bunde, MD sent at 11/18/2016  9:25 AM EST ----- Known fibroids seen.  Right ovary surgically absent per patient report.  Left ovary nml.  No free fluid.

## 2016-12-07 ENCOUNTER — Ambulatory Visit (INDEPENDENT_AMBULATORY_CARE_PROVIDER_SITE_OTHER): Payer: BLUE CROSS/BLUE SHIELD | Admitting: Obstetrics & Gynecology

## 2016-12-07 ENCOUNTER — Encounter: Payer: Self-pay | Admitting: Obstetrics & Gynecology

## 2016-12-07 VITALS — BP 143/88 | HR 89 | Resp 16 | Ht 67.0 in | Wt 192.0 lb

## 2016-12-07 DIAGNOSIS — N952 Postmenopausal atrophic vaginitis: Secondary | ICD-10-CM | POA: Diagnosis not present

## 2016-12-07 DIAGNOSIS — D252 Subserosal leiomyoma of uterus: Secondary | ICD-10-CM

## 2016-12-07 DIAGNOSIS — D251 Intramural leiomyoma of uterus: Secondary | ICD-10-CM | POA: Diagnosis not present

## 2016-12-07 MED ORDER — LIDOCAINE 5 % EX OINT: 1.0000 "application " | TOPICAL_OINTMENT | CUTANEOUS | 1 refills | Status: DC | PRN

## 2016-12-09 NOTE — Progress Notes (Signed)
Pt presents to discuss [place] results, vaginal biopsy results, and atrohpic vaginitis.    1-US FINDINGS: Uterus  Measurements: 9.3 x 7.7 x 6.9 cm. Multiple fibroids throughout the uterus, ranging from 3.3 cm to 5.6 cm.  Endometrium  Thickness: Obscured by fibroids.  Right ovary  Measurements: Prior oophorectomy. No adnexal mass seen.  Left ovary  Measurements: 3.9 x 2.5 x 2.4 cm. Difficult to visualize. No visible adnexal mass.  Other findings  No abnormal free fluid. Numerous uterine fibroids.  There is no need for further imagine.  Pt not having PMB.   Pt does have pelvic pain but has been told her pelvis is "a mess" and doesn't want a hysterectomy at this time.  Would refer her for robotic hysterectomy if she so decided.   2.  Cervical Biopsy Cervix, biopsy - BENIGN CERVICAL MUCOSA. - NO SQUAMOUS INTRAEPITHELIAL LESION. --No further work up needed  3.  Atrophic vaginitis OK to continue Estring.  Warned of rare occurrence of stimulating the endometrium.  Pt can elect for q3 month provera withdrawal bleeding.  At this time she does not want any further medication.  Pt also informed that pelvic PT would help with dyspareunia.  She declines this but would like lidocaine ointment to apply at time of intercourse.    4.  Mammogram was January 2017  5.  Calcium and vitamin D supplementation.  25 minutes spent face to face with patient with >50% counseling.

## 2017-01-18 ENCOUNTER — Emergency Department (INDEPENDENT_AMBULATORY_CARE_PROVIDER_SITE_OTHER): Payer: BLUE CROSS/BLUE SHIELD

## 2017-01-18 ENCOUNTER — Emergency Department (INDEPENDENT_AMBULATORY_CARE_PROVIDER_SITE_OTHER)
Admission: EM | Admit: 2017-01-18 | Discharge: 2017-01-18 | Disposition: A | Discharge status: Home / Self Care | Payer: BLUE CROSS/BLUE SHIELD | Source: Home / Self Care | Attending: Family Medicine | Admitting: Family Medicine

## 2017-01-18 ENCOUNTER — Encounter: Payer: Self-pay | Admitting: Emergency Medicine

## 2017-01-18 DIAGNOSIS — D259 Leiomyoma of uterus, unspecified: Secondary | ICD-10-CM | POA: Diagnosis not present

## 2017-01-18 DIAGNOSIS — R1032 Left lower quadrant pain: Secondary | ICD-10-CM

## 2017-01-18 DIAGNOSIS — K5732 Diverticulitis of large intestine without perforation or abscess without bleeding: Secondary | ICD-10-CM

## 2017-01-18 LAB — POCT URINALYSIS DIP (MANUAL ENTRY)
Bilirubin, UA: NEGATIVE
Glucose, UA: NEGATIVE
LEUKOCYTES UA: NEGATIVE
Nitrite, UA: NEGATIVE
PH UA: 6 (ref 5–8)
PROTEIN UA: NEGATIVE
Spec Grav, UA: 1.02 (ref 1.005–1.03)
UROBILINOGEN UA: 0.2 (ref 0–1)

## 2017-01-18 LAB — POCT CBC W AUTO DIFF (K'VILLE URGENT CARE)

## 2017-01-18 MED ORDER — CIPROFLOXACIN HCL 750 MG PO TABS: 750.0000 mg | ORAL_TABLET | Freq: Two times a day (BID) | ORAL | 0 refills | Status: DC

## 2017-01-18 MED ORDER — HYDROCODONE-ACETAMINOPHEN 5-325 MG PO TABS: 1.0000 | ORAL_TABLET | Freq: Four times a day (QID) | ORAL | 0 refills | Status: DC | PRN

## 2017-01-18 MED ORDER — METRONIDAZOLE 500 MG PO TABS: 500.0000 mg | ORAL_TABLET | Freq: Four times a day (QID) | ORAL | 0 refills | Status: DC

## 2017-01-18 MED ORDER — IOPAMIDOL (ISOVUE-300) INJECTION 61%
100.0000 mL | Freq: Once | INTRAVENOUS | Status: AC | PRN
  Administered 2017-01-18: 100 mL via INTRAVENOUS

## 2017-01-18 NOTE — ED Triage Notes (Signed)
Reports onset of low abdominal/pelvic pain 1/23 evening; intermittent; radiates up into abdomen and back towards rectum; normal BMs until today when looked like mucus.Has taken Baclofen and ibuprofen for pain.

## 2017-01-18 NOTE — ED Notes (Signed)
Bed: [id number] Expected date:  Expected time:  Means of arrival:  Comments: Pt in CT- [place]

## 2017-01-18 NOTE — ED Provider Notes (Signed)
[name]    CSN: [id number] Arrival date & time: 01/18/17  0806     History   Chief Complaint Chief Complaint  Patient presents with  . Abdominal Pain    low    HPI Kayla Hood is a 51 y.o. female.    Patient complains of two day history of constant non-radiating lower abdominal pain.  She is having painful tenesmus, associated with hematochezia and mucous in stool.  She developed chills and fever to 100 last night.  She has nausea when her pain increases but no vomiting.  Her pain responded somewhat to baclofen initially.  Ibuprofen has not been helpful.  Her bowel movements had been normal prior to her present symptoms. She has a past history of endometriosis with ablation and right oophorectomy 2006. Family history of ulcerative colitis mother, father with diverticulitis.   The history is provided by the patient.  Abdominal Pain  Pain location:  LLQ Pain quality: aching and bloating   Pain radiates to:  Does not radiate Pain severity:  Moderate Onset quality:  Sudden Duration:  2 days Timing:  Constant Progression:  Worsening Chronicity:  New Context: previous surgery   Context: not awakening from sleep, not diet changes, not eating, not laxative use, not recent illness, not recent travel, not sick contacts, not suspicious food intake and not trauma   Relieved by:  Nothing Worsened by:  Movement and bowel movements Ineffective treatments: baclofen. Associated symptoms: anorexia, chills, constipation, fatigue, fever and hematochezia   Associated symptoms: no chest pain, no cough, no diarrhea, no dysuria, no flatus, no hematemesis, no hematuria, no melena, no nausea, no shortness of breath, no sore throat, no vaginal bleeding and no vomiting   Risk factors: multiple surgeries     Past Medical History:  Diagnosis Date  . Allergy   . Chicken pox   . Depression   . Hypertension   . Incontinence of urine   . UTI (lower urinary tract infection)      Patient Active Problem List   Diagnosis Date Noted  . Abnormal weight gain 11/18/2016  . Nodule of finger of both hands 08/18/2016  . Hidradenitis suppurativa 12/22/2015  . Hot flashes due to surgical menopause 12/22/2015  . Hyperlipidemia 12/22/2015  . Essential hypertension, benign 12/22/2015  . Vaginal dryness, menopausal 12/22/2015  . Anxiety 12/22/2015  . Screening for diabetes mellitus 12/22/2015  . Depression with anxiety 07/08/2015  . Endometriosis 07/08/2015  . Fibroid 07/08/2015  . 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 Anxiety state 06/07/2015  . Viral URI 03/09/2015  . Facial edema 02/04/2014  . Hives 01/26/2014  . RLQ abdominal pain 01/26/2014  . Exposure to blood or body fluid 11/24/2013  . Tendonitis of shoulder 09/01/2013  . Left hip pain 06/28/2013  . Palpitations 03/21/2013  . Abnormal chest x-ray 03/03/2013  . Irregular heart beat 03/03/2013  . Sleep disturbance 03/03/2013  . General medical exam 03/03/2013    Past Surgical History:  Procedure Laterality Date  . ABLATION    . CERVICAL FUSION  2012  . pelvic laproscopic surgeries     4 prior to 2008    OB History    Gravida Para Term Preterm AB Living   3 1     2 1    SAB TAB Ectopic Multiple Live Births   2               Home Medications    Prior to Admission medications   Medication Sig Start Date  End Date Taking? Authorizing Provider  ciprofloxacin (CIPRO) 750 MG tablet Take 1 tablet (750 mg total) by mouth 2 (two) times daily. (Take every 12 hours) 01/18/17   Kandra Nicolas, MD  escitalopram (LEXAPRO) 10 MG tablet TAKE 1 TABLET(10 MG) BY MOUTH DAILY 06/28/16   Jade L Breeback, PA-C  ESTRING 2 MG vaginal ring I 1 RING VAGINALLY Q 3 MONTHS. FPD 09/27/16   Historical Provider, MD  HYDROcodone-acetaminophen (NORCO/VICODIN) 5-325 MG tablet Take 1 tablet by mouth every 6 (six) hours as needed for moderate pain. 01/18/17   Kandra Nicolas, MD  ibuprofen (ADVIL,MOTRIN) 200 MG tablet Take 600 mg by  mouth every 6 (six) hours as needed for pain.    Historical Provider, MD  lidocaine (XYLOCAINE) 5 % ointment Apply 1 application topically as needed. 12/07/16   Guss Bunde, MD  LORazepam (ATIVAN) 0.5 MG tablet Take 1 tablet (0.5 mg total) by mouth 2 (two) times daily as needed for anxiety. 06/23/15   Rubbie Battiest, NP  Lorcaserin HCl 10 MG TABS Take 1 tablet by mouth 2 (two) times daily. 11/15/16   Jade L Breeback, PA-C  losartan (COZAAR) 25 MG tablet Take 1 tablet (25 mg total) by mouth daily. 11/15/16   Jade L Breeback, PA-C  metroNIDAZOLE (FLAGYL) 500 MG tablet Take 1 tablet (500 mg total) by mouth 4 (four) times daily. (Take every 6 hours) 01/18/17   Kandra Nicolas, MD  triamcinolone (KENALOG) 0.025 % ointment Apply a thin leyer toa area BID for 3 days then once daily 11/08/16   Guss Bunde, MD  valACYclovir (VALTREX) 1000 MG tablet Take 2 tablets twice a day for 1 day. 06/28/16   Jade L Breeback, PA-C  zolpidem (AMBIEN) 10 MG tablet Take 1 tablet (10 mg total) by mouth at bedtime. 11/15/16   Donella Stade, PA-C    Family History Family History  Problem Relation Age of Onset  . Hyperlipidemia Mother   . Heart disease Father 50    CAD  . Hypertension Father   . Hypertension Brother   . Cancer Maternal Grandfather 50    Stomach Cancer  . Cancer Paternal Grandmother 29    Uterine Cancer  . Stroke Paternal Grandfather 52    CVA  . Cancer Maternal Grandmother     Social History Social History  Substance Use Topics  . Smoking status: Former Smoker    Packs/day: 0.25    Years: 5.00    Types: Cigarettes    Quit date: 03/21/2000  . Smokeless tobacco: Never Used  . Alcohol use No     Comment: Occasionally      Allergies   Promethazine and Phenergan [promethazine hcl]   Review of Systems Review of Systems  Constitutional: Positive for chills, fatigue and fever.  HENT: Negative for sore throat.   Respiratory: Negative for cough and shortness of breath.    Cardiovascular: Negative for chest pain.  Gastrointestinal: Positive for abdominal pain, anorexia, constipation and hematochezia. Negative for diarrhea, flatus, hematemesis, melena, nausea and vomiting.  Genitourinary: Negative for dysuria, hematuria and vaginal bleeding.  All other systems reviewed and are negative.    Physical Exam Triage Vital Signs ED Triage Vitals  Enc Vitals Group     BP 01/18/17 0826 137/93     Pulse Rate 01/18/17 0826 103     Resp 01/18/17 0826 16     Temp 01/18/17 0826 98.9 F (37.2 C)     Temp Source 01/18/17 0826 Oral  SpO2 01/18/17 0826 98 %     Weight 01/18/17 0826 180 lb (81.6 kg)     Height 01/18/17 0826 5\' 7"  (1.702 m)     Head Circumference --      Peak Flow --      Pain Score 01/18/17 0829 8     Pain Loc --      Pain Edu? --      Excl. in [place]? --    No data found.   Updated Vital Signs BP 137/93 (BP Location: Left Arm)   Pulse 103   Temp 98.9 F (37.2 C) (Oral)   Resp 16   Ht 5\' 7"  (1.702 m)   Wt 180 lb (81.6 kg)   SpO2 98%   BMI 28.19 kg/m   Visual Acuity Right Eye Distance:   Left Eye Distance:   Bilateral Distance:    Right Eye Near:   Left Eye Near:    Bilateral Near:     Physical Exam  Constitutional: She appears well-developed and well-nourished. No distress.  HENT:  Head: Normocephalic.  Right Ear: External ear normal.  Left Ear: External ear normal.  Nose: Nose normal.  Mouth/Throat: Oropharynx is clear and moist.  Eyes: Conjunctivae and EOM are normal. Pupils are equal, round, and reactive to light.  Neck: Neck supple.  Cardiovascular: Normal heart sounds.   Tachycardia present  Pulmonary/Chest: Breath sounds normal.  Abdominal: Bowel sounds are decreased. There is no hepatosplenomegaly. There is generalized tenderness and tenderness in the left lower quadrant. There is rebound and guarding. There is no rigidity, no CVA tenderness and no tenderness at McBurney's point.    Patient's abdominal tenderness  is maximal in the left lower quadrant as noted on diagram.   Musculoskeletal: She exhibits no edema.  Lymphadenopathy:    She has no cervical adenopathy.  Neurological: She is alert.  Skin: Skin is warm and dry. No rash noted.  Nursing note and vitals reviewed.    UC Treatments / Results  Labs (all labs ordered are listed, but only abnormal results are displayed) Labs Reviewed  POCT URINALYSIS DIP (MANUAL ENTRY) - Abnormal; Notable for the following:       Result Value   Blood, UA small (*)    All other components within normal limits  POCT CBC W AUTO DIFF ([facility]):  WBC 11.8; LY 13.3; MO 7.7; GR 79.0; Hgb 14.6; Platelets 297     EKG  EKG Interpretation None       Radiology Ct Abdomen Pelvis W Contrast  Result Date: 01/18/2017 CLINICAL DATA:  Epigastric and left lower quadrant abdominal pain. EXAM: CT ABDOMEN AND PELVIS WITH CONTRAST TECHNIQUE: Multidetector CT imaging of the abdomen and pelvis was performed using the standard protocol following bolus administration of intravenous contrast. CONTRAST:  166mL ISOVUE-300 IOPAMIDOL (ISOVUE-300) INJECTION 61% COMPARISON:  CT scan of October 22, 2012. FINDINGS: Lower chest: No acute abnormality. Hepatobiliary: Stable right hepatic cyst or hemangioma is noted. No gallstones are noted. Pancreas: Unremarkable. No pancreatic ductal dilatation or surrounding inflammatory changes. Spleen: Normal in size without focal abnormality. Adrenals/Urinary Tract: Adrenal glands are unremarkable. Kidneys are normal, without renal calculi, focal lesion, or hydronephrosis. Bladder is unremarkable. Stomach/Bowel: Appendix appears normal. Inflammatory changes are seen involving the distal sigmoid colon consistent with diverticulitis. There is no evidence of bowel obstruction. Vascular/Lymphatic: No significant vascular findings are present. No enlarged abdominal or pelvic lymph nodes. Reproductive: Multiple large uterine fibroids are again noted and  unchanged compared to prior exam.  No adnexal abnormality is noted. Other: No abdominal wall hernia or abnormality. No abdominopelvic ascites. Musculoskeletal: No acute or significant osseous findings. IMPRESSION: Findings consistent with sigmoid diverticulitis. No abscess or fluid collection is noted. Stable fibroid uterus is noted. Electronically Signed   By: Marijo Conception, M.D.   On: 01/18/2017 11:53    Procedures Procedures (including critical care time)  Medications Ordered in UC Medications - No data to display   Initial Impression / Assessment and Plan / UC Course  I have reviewed the triage vital signs and the nursing notes.  Pertinent labs & imaging results that were available during my care of the patient were reviewed by me and considered in my medical decision making (see chart for details).    Note mild leukocytosis. Begin Cipro and Flagyl.  Rx for Lortab. Begin clear liquids for about 24 hours, then may begin a BRAT diet (Bananas, Rice, Applesauce, Toast) when abdominal pain improves.  Then gradually advance to a regular diet as tolerated.  Avoid milk products until well.   If symptoms become significantly worse during the night or over the weekend, proceed to the local emergency room.  Followup with Family Doctor in one week (recommend follow-up colonoscopy when symptoms resolved).    Final Clinical Impressions(s) / UC Diagnoses   Final diagnoses:  Left lower quadrant pain  Diverticulitis of sigmoid colon    New Prescriptions New Prescriptions   CIPROFLOXACIN (CIPRO) 750 MG TABLET    Take 1 tablet (750 mg total) by mouth 2 (two) times daily. (Take every 12 hours)   HYDROCODONE-ACETAMINOPHEN (NORCO/VICODIN) 5-325 MG TABLET    Take 1 tablet by mouth every 6 (six) hours as needed for moderate pain.   METRONIDAZOLE (FLAGYL) 500 MG TABLET    Take 1 tablet (500 mg total) by mouth 4 (four) times daily. (Take every 6 hours)     Kandra Nicolas, MD 01/21/17 1320

## 2017-01-18 NOTE — Discharge Instructions (Signed)
Begin clear liquids for about 24 hours, then may begin a BRAT diet (Bananas, Rice, Applesauce, Toast) when abdominal pain improves.  Then gradually advance to a regular diet as tolerated.  Avoid milk products until well.   If symptoms become significantly worse during the night or over the weekend, proceed to the local emergency room.

## 2017-01-19 ENCOUNTER — Telehealth: Payer: Self-pay | Admitting: *Deleted

## 2017-01-19 NOTE — Telephone Encounter (Signed)
Callback: No answer, LMOM f/u from visit. Call back as needed. 

## 2017-01-23 ENCOUNTER — Encounter: Payer: Self-pay | Admitting: Physician Assistant

## 2017-01-23 ENCOUNTER — Ambulatory Visit (INDEPENDENT_AMBULATORY_CARE_PROVIDER_SITE_OTHER): Payer: BLUE CROSS/BLUE SHIELD | Admitting: Physician Assistant

## 2017-01-23 ENCOUNTER — Ambulatory Visit: Payer: BLUE CROSS/BLUE SHIELD | Admitting: Physical Therapy

## 2017-01-23 VITALS — BP 142/92 | HR 87 | Ht 67.0 in | Wt 190.0 lb

## 2017-01-23 DIAGNOSIS — Z8379 Family history of other diseases of the digestive system: Secondary | ICD-10-CM

## 2017-01-23 DIAGNOSIS — K5732 Diverticulitis of large intestine without perforation or abscess without bleeding: Secondary | ICD-10-CM

## 2017-01-23 DIAGNOSIS — N951 Menopausal and female climacteric states: Secondary | ICD-10-CM

## 2017-01-23 HISTORY — DX: Diverticulitis of large intestine without perforation or abscess without bleeding: K57.32

## 2017-01-23 MED ORDER — ESTRADIOL 0.1 MG/GM VA CREA: 1.0000 | TOPICAL_CREAM | Freq: Every day | VAGINAL | 5 refills | Status: DC

## 2017-01-23 MED ORDER — AMOXICILLIN-POT CLAVULANATE 875-125 MG PO TABS: 1.0000 | ORAL_TABLET | Freq: Two times a day (BID) | ORAL | 0 refills | Status: DC

## 2017-01-23 NOTE — Patient Instructions (Signed)

## 2017-01-23 NOTE — Progress Notes (Addendum)
Subjective:    Patient ID: Kayla Hood, female    DOB: December 30, 1965, 51 y.o.   MRN: [id number]  HPI  Patient is a 51yo female who presents for follow-up after being diagnosed with diverticultitis on 01/18/17 by UC.  She states this is her first occurrence of diverticulitis.  Patient reports she has been taking Flagyl and cipro daily.  She states her pain has diminished to 1/10.   Patient denies any blood in stool, fever, body aches, chills, nausea, or vomiting.  She states her appetite is good and she has slowing been advancing her diet and drinking a lot of water.  Patient denies taking fiber supplements, but does state she gets fiber in her diet.  She states she is still having loose stools but not diarrhea, and she attributes this to her antibiotic use.  Last bowel movement was this morning.  Patient states she has been taking a muscle relaxant for the occasional rectal spasms and discomfort.  She states she has not taken the Lortab since the acute event.  Patient states she was told she would need a follow-up colonoscopy.  Patient states she has family history of UC and diverticulitis .     Patient would also like to discuss her Estring, which she took out at the start of her abdominal cramping because she was wondering if it was making her pain worse.  She states she has not re-interested the ring, and she would like to discuss getting back on estrace for vaginal dryness because she states she occasionally can feel the ring pressing on her bowels.        Review of Systems  Gastrointestinal: Positive for abdominal pain (Mild LLQ) and rectal pain (Tenesmus).  All other systems reviewed and are negative.      Objective:   Physical Exam  Constitutional: She is oriented to person, place, and time. She appears well-developed and well-nourished.  HENT:  Head: Normocephalic and atraumatic.  Cardiovascular: Normal rate, regular rhythm and normal heart sounds.   Pulmonary/Chest: Effort normal  and breath sounds normal.  Abdominal: Soft. Bowel sounds are normal. She exhibits no distension and no mass. There is tenderness (Severe tenderness to palpation over LLQ and RLQ). There is guarding. There is no rebound.  Neurological: She is alert and oriented to person, place, and time.  Skin: Skin is warm and dry.  Psychiatric: She has a normal mood and affect. Her behavior is normal.  Nursing note and vitals reviewed.         Assessment & Plan:  Marland KitchenMarland KitchenDiagnoses and all orders for this visit:  Diverticulitis of large intestine without perforation or abscess, unspecified bleeding status -     amoxicillin-clavulanate (AUGMENTIN) 875-125 MG tablet; Take 1 tablet by mouth 2 (two) times daily. For 10 days.  Vaginal dryness, menopausal -     estradiol (ESTRACE VAGINAL) 0.1 MG/GM vaginal cream; Place 1 Applicatorful vaginally at bedtime.   Due to patient's severe RLQ and LLQ tenderness and guarding on exam, she was instructed to finish her Flagyl 500mg  and cipro 750mg .  She was also given a prescription for Augmentin to take once her other prescriptions are finished if her abdominal pain and tenesmus continue.  A referral was made to GI for a colonoscopy consultation.  Explained that they will not do the colonoscopy while she is still having abdominal pain.  Explained the importance of scheduling a colonoscopy, especially with her family history of UC and diverticulitis.  Informed patient taking fiber supplements, probiotics, and increased oral intake can help with regularly of bowel movements.    Patient was given a refill of Estrace for vaginal dryness.  Patient does not want to use the Estring due to it pressing on her bowels while they are inflamed.    Instructed patient to let us know how she is doing in 3 days.  Follow-up as needed if symptoms significantly worsen.

## 2017-01-23 NOTE — Addendum Note (Signed)
Addended by: Donella Stade on: 01/23/2017 05:32 PM   Modules accepted: Orders, Level of Service

## 2017-01-29 ENCOUNTER — Encounter: Payer: Self-pay | Admitting: Physician Assistant

## 2017-01-29 ENCOUNTER — Other Ambulatory Visit: Payer: Self-pay | Admitting: Physician Assistant

## 2017-01-29 MED ORDER — NYSTATIN 100000 UNIT/ML MT SUSP: 500000.0000 [IU] | Freq: Four times a day (QID) | OROMUCOSAL | 0 refills | Status: DC

## 2017-02-14 ENCOUNTER — Other Ambulatory Visit: Payer: Self-pay | Admitting: Physician Assistant

## 2017-02-14 ENCOUNTER — Encounter: Payer: Self-pay | Admitting: Physician Assistant

## 2017-02-14 MED ORDER — LORAZEPAM 0.5 MG PO TABS: 0.5000 mg | ORAL_TABLET | Freq: Two times a day (BID) | ORAL | 0 refills | Status: DC | PRN

## 2017-03-12 ENCOUNTER — Encounter: Payer: Self-pay | Admitting: Physician Assistant

## 2017-03-12 ENCOUNTER — Ambulatory Visit (INDEPENDENT_AMBULATORY_CARE_PROVIDER_SITE_OTHER): Payer: BLUE CROSS/BLUE SHIELD | Admitting: Physician Assistant

## 2017-03-12 VITALS — BP 140/88 | HR 85 | Ht 67.0 in | Wt 187.0 lb

## 2017-03-12 DIAGNOSIS — Z111 Encounter for screening for respiratory tuberculosis: Secondary | ICD-10-CM

## 2017-03-12 DIAGNOSIS — L299 Pruritus, unspecified: Secondary | ICD-10-CM | POA: Insufficient documentation

## 2017-03-12 DIAGNOSIS — Z0000 Encounter for general adult medical examination without abnormal findings: Secondary | ICD-10-CM

## 2017-03-12 HISTORY — DX: Pruritus, unspecified: L29.9

## 2017-03-12 MED ORDER — FLUTICASONE PROPIONATE 50 MCG/ACT NA SUSP
2.0000 | Freq: Every day | NASAL | 2 refills | Status: DC
Start: 2017-03-12 — End: 2017-07-23

## 2017-03-12 NOTE — Progress Notes (Signed)
   Subjective:    Patient ID: Kayla Hood, female    DOB: 1966/08/13, 51 y.o.   MRN: [id number]  HPI Pt is a 51 yo female who presents to the clinic to fill out paperwork for her new job. 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 She has no concerns.   She is controlled on her medications.   .. Active Ambulatory Problems    Diagnosis Date Noted  . Abnormal chest x-ray 03/03/2013  . Irregular heart beat 03/03/2013  . Sleep disturbance 03/03/2013  . General medical exam 03/03/2013  . Palpitations 03/21/2013  . Left hip pain 06/28/2013  . Tendonitis of shoulder 09/01/2013  . Hives 01/26/2014  . RLQ abdominal pain 01/26/2014  . Facial edema 02/04/2014  . Viral URI 03/09/2015  . Anxiety state 06/07/2015  . Depression with anxiety 07/08/2015  . Endometriosis 07/08/2015  . Fibroid 07/08/2015  . Exposure to blood or body fluid 11/24/2013  . 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 Hidradenitis suppurativa 12/22/2015  . Hot flashes due to surgical menopause 12/22/2015  . Hyperlipidemia 12/22/2015  . Essential hypertension, benign 12/22/2015  . Vaginal dryness, menopausal 12/22/2015  . Anxiety 12/22/2015  . Screening for diabetes mellitus 12/22/2015  . Nodule of finger of both hands 08/18/2016  . Abnormal weight gain 11/18/2016  . Diverticulitis of large intestine without perforation or abscess 01/23/2017   Resolved Ambulatory Problems    Diagnosis Date Noted  . No Resolved Ambulatory Problems   Past Medical History:  Diagnosis Date  . Allergy   . Chicken pox   . Depression   . Hypertension   . Incontinence of urine   . UTI (lower urinary tract infection)       Review of Systems  All other systems reviewed and are negative.      Objective:   Physical Exam  Constitutional: She is oriented to person, place, and time. She appears well-developed and well-nourished.  HENT:  Head: Normocephalic and atraumatic.  Right Ear: External ear normal.  Left Ear: External  ear normal.  TM's clear bilaterally.   Eyes: Conjunctivae are normal. Right eye exhibits no discharge. Left eye exhibits no discharge.  Neck: Normal range of motion. Neck supple.  Cardiovascular: Normal rate, regular rhythm and normal heart sounds.   Pulmonary/Chest: Effort normal and breath sounds normal.  Lymphadenopathy:    She has no cervical adenopathy.  Neurological: She is alert and oriented to person, place, and time.  Psychiatric: She has a normal mood and affect. Her behavior is normal.          Assessment & Plan:  Marland KitchenMarland KitchenDiagnoses and all orders for this visit:  Preventative health care  Screening-pulmonary TB -     TB Skin Test  Ear itching -     fluticasone (FLONASE) 50 MCG/ACT nasal spray; Place 2 sprays into both nostrils daily.   Pt is on 2 year mammogram schedule due to no family hx.  Come back in 48-72 hours for TB read.  Paperwork filled out.  Reassurance given that bilateral ears look great. Consider flonase at bedtime.

## 2017-03-14 ENCOUNTER — Ambulatory Visit (INDEPENDENT_AMBULATORY_CARE_PROVIDER_SITE_OTHER): Payer: BLUE CROSS/BLUE SHIELD | Admitting: Physician Assistant

## 2017-03-14 VITALS — BP 120/71 | HR 79

## 2017-03-14 DIAGNOSIS — Z111 Encounter for screening for respiratory tuberculosis: Secondary | ICD-10-CM | POA: Diagnosis not present

## 2017-03-14 LAB — TB SKIN TEST
Induration: 0 mm
TB Skin Test: NEGATIVE

## 2017-03-14 NOTE — Progress Notes (Signed)
Pt came into clinic today for PPD read, test was negative. Form completed and printed copy of results given. No further questions.

## 2017-04-11 ENCOUNTER — Other Ambulatory Visit: Payer: Self-pay | Admitting: Physician Assistant

## 2017-05-08 ENCOUNTER — Ambulatory Visit (INDEPENDENT_AMBULATORY_CARE_PROVIDER_SITE_OTHER): Payer: BLUE CROSS/BLUE SHIELD | Admitting: Physician Assistant

## 2017-05-08 ENCOUNTER — Encounter: Payer: Self-pay | Admitting: Physician Assistant

## 2017-05-08 VITALS — BP 138/82 | HR 83 | Ht 67.0 in | Wt 185.0 lb

## 2017-05-08 DIAGNOSIS — F339 Major depressive disorder, recurrent, unspecified: Secondary | ICD-10-CM | POA: Diagnosis not present

## 2017-05-08 MED ORDER — VILAZODONE HCL 20 MG PO TABS: 1.0000 | ORAL_TABLET | Freq: Every day | ORAL | 1 refills | Status: DC

## 2017-05-08 NOTE — Patient Instructions (Signed)
Vilazodone oral tablet What is this medicine? VILAZODONE (vil [place] oh done) is used to treat depression. This medicine may be used for other purposes; ask your health care provider or pharmacist if you have questions. COMMON BRAND NAME(S): VIIBRYD What should I tell my health care provider before I take this medicine? They need to know if you have any of these conditions: -bipolar disorder or a family history of bipolar disorder -glaucoma -liver disease -low levels of sodium in the blood -receiving electroconvulsive therapy -seizures (convulsions) -suicidal thoughts, plans, or attempt by you or a family member -an unusual or allergic reaction to vilazodone, other medicines, foods, dyes or preservatives -pregnant or trying to get pregnant -breast-feeding How should I use this medicine? Take this medicine by mouth with a glass of water. Follow the directions on the prescription label. Take this medicine with food. Take your medicine at regular intervals. Do not take your medicine more often than directed. Do not stop taking this medicine suddenly except upon the advice of your doctor. Stopping this medicine too quickly may cause serious side effects or your condition may worsen. A special MedGuide will be given to you by the pharmacist with each prescription and refill. Be sure to read this information carefully each time. Overdosage: If you think you have taken too much of this medicine contact a poison control center or emergency room at once. NOTE: This medicine is only for you. Do not share this medicine with others. What if I miss a dose? If you miss a dose, take it as soon as you can. If it is almost time for your next dose, take only that dose. Do not take double or extra doses. What may interact with this medicine? Do not take this medicine with any of the following medications: -linezolid -MAOIs like Carbex, Eldepryl, Marplan, Nardil, and Parnate -methylene blue (injected into a  vein) This medicine may also interact with the following medications: -amphetamines -aspirin and aspirin-like medicines -buspirone -certain diet drugs like dexfenfluramine, fenfluramine, phentermine, sibutramine -certain migraine headache medicines like almotriptan, eletriptan, frovatriptan, naratriptan, rizatriptan, sumatriptan, zolmitriptan -certain medicines that treat or prevent blood clots like warfarin, enoxaparin, and dalteparin -certain medicines that treat infections like clarithromycin, itraconazole, voriconazole, ketoconazole, rifampin -certain medicines that treat seizures like carbamazepine and phenytoin -digoxin -fentanyl -lithium -NSAIDS, medicines for pain and inflammation, like ibuprofen or naproxen -other medicines for depression, anxiety, or psychotic disturbances -St. John's Wort -tramadol -tryptophan This list may not describe all possible interactions. Give your health care provider a list of all the medicines, herbs, non-prescription drugs, or dietary supplements you use. Also tell them if you smoke, drink alcohol, or use illegal drugs. Some items may interact with your medicine. What should I watch for while using this medicine? Tell your doctor if your symptoms do not get better or if they get worse. Visit your doctor or health care professional for regular checks on your progress. Because it may take several weeks to see the full effects of this medicine, it is important to continue your treatment as prescribed by your doctor. Patients and their families should watch out for new or worsening thoughts of suicide or depression. Also watch out for sudden changes in feelings such as feeling anxious, agitated, panicky, irritable, hostile, aggressive, impulsive, severely restless, overly excited and hyperactive, or not being able to sleep. If this happens, especially at the beginning of treatment or after a change in dose, call your health care professional. You may get  drowsy or dizzy. Do   not drive, use machinery, or do anything that needs mental alertness until you know how this medicine affects you. Do not stand or sit up quickly, especially if you are an older patient. This reduces the risk of dizzy or fainting spells. Alcohol may interfere with the effect of this medicine. Avoid alcoholic drinks. Your mouth may get dry. Chewing sugarless gum or sucking hard candy, and drinking plenty of water may help. Contact your doctor if the problem does not go away or is severe. What side effects may I notice from receiving this medicine? Side effects that you should report to your doctor or health care professional as soon as possible: -allergic reactions like skin rash, itching or hives, swelling of the face, lips, or tongue -anxious -black, tarry stools -changes in vision -confusion -elevated mood, decreased need for sleep, racing thoughts, impulsive behavior -eye pain -fast, irregular heartbeat -feeling faint or lightheaded, falls -feeling agitated, angry, or irritable -hallucination, loss of contact with reality -loss of balance or coordination -loss of memory -restlessness, pacing, inability to keep still -seizures -stiff muscles -suicidal thoughts or other mood changes -trouble sleeping -unusual bleeding or bruising -unusually weak or tired -vomiting Side effects that usually do not require medical attention (report to your doctor or health care professional if they continue or are bothersome): -change in appetite or weight -change in sex drive or performance -diarrhea -drowsiness -dry mouth -increased sweating -nausea -tremors This list may not describe all possible side effects. Call your doctor for medical advice about side effects. You may report side effects to FDA at 1-800-FDA-1088. Where should I keep my medicine? Keep out of the reach of children. Store at room temperature between 15 and 30 degrees C (59 to 86 degrees F). Throw away any  unused medicine after the expiration date. NOTE: This sheet is a summary. It may not cover all possible information. If you have questions about this medicine, talk to your doctor, pharmacist, or health care provider.  2018 Elsevier/Gold Standard (2016-08-08 12:50:48)  

## 2017-05-08 NOTE — Progress Notes (Signed)
   Subjective:    Patient ID: Kayla Hood, female    DOB: 07-18-1966, 51 y.o.   MRN: [id number]  HPI  Pt is a 51 yo female who presents to the clinic to discuss depression. She has been on lexapro 10mg  for 2 years after zoloft stopped working for her. It worked well for a while but she has noticed in last few months she is getting more and more down, not motivated, crying all the, unsatisfied, not settled. She never has thoughts of suicide but she does wish "it would all be over" and "that she does not want to live like this anymore". The only stressor is moving to [place] when she was originally from [place]. She did like it better up there. Her husband is very supportive.     Review of Systems  All other systems reviewed and are negative.      Objective:   Physical Exam  Constitutional: She is oriented to person, place, and time. She appears well-developed and well-nourished.  HENT:  Head: Normocephalic and atraumatic.  Cardiovascular: Normal rate and normal heart sounds.   Pulmonary/Chest: Effort normal and breath sounds normal.  Neurological: She is alert and oriented to person, place, and time.  Psychiatric: Her behavior is normal.  Flat affect and tearful.           Assessment & Plan:  Marland KitchenMarland KitchenDiagnoses and all orders for this visit:  Major depression, recurrent, chronic (Luis Lopez)  Other orders -     Vilazodone HCl 20 MG TABS; Take 1 tablet (20 mg total) by mouth daily.   Taper off lexapro and start viibryd. Coupon card given. Discussed potential side effects but discussed viibryd is weight netural. Follow up in 6 weeks or sooner if needed. Pt declined counseling as she has tried before but since there is nothing wrong in her life does not get much benefit from it. Discussed seeing a psychiatrist and she would like to hold off for now.   Spent 30 minutes with patient and greater than 50 percent of visit spent counseling pt regarding depression and treatment.

## 2017-05-09 ENCOUNTER — Encounter: Payer: Self-pay | Admitting: Physician Assistant

## 2017-05-09 DIAGNOSIS — F339 Major depressive disorder, recurrent, unspecified: Secondary | ICD-10-CM | POA: Insufficient documentation

## 2017-05-09 HISTORY — DX: Major depressive disorder, recurrent, unspecified: F33.9

## 2017-05-10 NOTE — Telephone Encounter (Signed)
Can we look into PA for viibryd for this patient? thanks

## 2017-05-14 ENCOUNTER — Telehealth: Payer: Self-pay | Admitting: Physician Assistant

## 2017-05-14 NOTE — Telephone Encounter (Signed)
Received PA for Viibryd sent through cover my meds and received approval. I will contact pharmacy and patient.  Case ID: [id number] 04/14/17 - 05/14/18

## 2017-06-05 ENCOUNTER — Encounter: Payer: Self-pay | Admitting: Physician Assistant

## 2017-06-05 DIAGNOSIS — F418 Other specified anxiety disorders: Secondary | ICD-10-CM

## 2017-06-05 DIAGNOSIS — F339 Major depressive disorder, recurrent, unspecified: Secondary | ICD-10-CM

## 2017-06-13 ENCOUNTER — Telehealth: Payer: Self-pay | Admitting: Physician Assistant

## 2017-06-13 ENCOUNTER — Other Ambulatory Visit: Payer: Self-pay | Admitting: Physician Assistant

## 2017-06-13 MED ORDER — ZOLPIDEM TARTRATE 10 MG PO TABS: 10.0000 mg | ORAL_TABLET | Freq: Every day | ORAL | 0 refills | Status: DC

## 2017-06-13 NOTE — Telephone Encounter (Signed)
I called the company and began the Provider registration process. They have a rep that is in our area, [employer]. He will contact clinic to complete Provider registration then we can order kit for Patient.

## 2017-06-13 NOTE — Telephone Encounter (Signed)
Pt advised of status update 

## 2017-06-13 NOTE — Telephone Encounter (Signed)
Can you look into how we would order the Genesight tool to find the optimal fit for mood medication? Pt is interested but I do not know anything about it. I do think this is a good option as we have tried a lot of antidepressant without benefit or having side effect.

## 2017-06-13 NOTE — Telephone Encounter (Signed)
Awesome! Can you contact patient and keep her in the loop! Your the best!

## 2017-06-23 ENCOUNTER — Emergency Department (INDEPENDENT_AMBULATORY_CARE_PROVIDER_SITE_OTHER)
Admission: EM | Admit: 2017-06-23 | Discharge: 2017-06-23 | Disposition: A | Discharge status: Home / Self Care | Payer: BLUE CROSS/BLUE SHIELD | Source: Home / Self Care | Attending: Family Medicine | Admitting: Family Medicine

## 2017-06-23 ENCOUNTER — Encounter: Payer: Self-pay | Admitting: Emergency Medicine

## 2017-06-23 DIAGNOSIS — H60332 Swimmer's ear, left ear: Secondary | ICD-10-CM

## 2017-06-23 MED ORDER — NEOMYCIN-POLYMYXIN-HC 3.5-10000-1 OT SUSP: 4.0000 [drp] | Freq: Three times a day (TID) | OTIC | 0 refills | Status: DC

## 2017-06-23 NOTE — ED Provider Notes (Signed)
CSN: [id number]     Arrival date & time 06/23/17  1332 History   First MD Initiated Contact with Patient 06/23/17 1353     Chief Complaint  Patient presents with  . Otalgia   (Consider location/radiation/quality/duration/timing/severity/associated sxs/prior Treatment) HPI  Kayla Hood is a 51 y.o. female presenting to UC with c/o bilateral ear fullness and pain developing in Left ear that started about 2 weeks ago. 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 She does not wear ear plugs. She has gotten swimmer's ear in the past.  Denies other symptoms including no cough, congestion, or sore throat. No fever or chills.    Past Medical History:  Diagnosis Date  . Allergy   . Chicken pox   . Depression   . Hypertension   . Incontinence of urine   . UTI (lower urinary tract infection)    Past Surgical History:  Procedure Laterality Date  . ABLATION    . CERVICAL FUSION  2012  . pelvic laproscopic surgeries     4 prior to 2008   Family History  Problem Relation Age of Onset  . Hyperlipidemia Mother   . Ulcerative colitis Mother   . Heart disease Father 55       CAD  . Hypertension Father   . Diverticulitis Father   . Hypertension Brother   . Cancer Maternal Grandfather 58       Stomach Cancer  . Cancer Paternal Grandmother 64       Uterine Cancer  . Stroke Paternal Grandfather 59       CVA  . Cancer Maternal Grandmother    Social History  Substance Use Topics  . Smoking status: Former Smoker    Packs/day: 0.25    Years: 5.00    Types: Cigarettes    Quit date: 03/21/2000  . Smokeless tobacco: Never Used  . Alcohol use No     Comment: Occasionally    OB History    Gravida Para Term Preterm AB Living   3 1     2 1    SAB TAB Ectopic Multiple Live Births   2             Review of Systems  Constitutional: Negative for chills and fever.  HENT: Positive for ear pain. Negative for congestion, postnasal drip, rhinorrhea and sore throat.   Respiratory:  Negative for cough and wheezing.   Neurological: Negative for dizziness, light-headedness and headaches.    Allergies  Promethazine and Phenergan [promethazine hcl]  Home Medications   Prior to Admission medications   Medication Sig Start Date End Date Taking? Authorizing Provider  fluticasone (FLONASE) 50 MCG/ACT nasal spray Place 2 sprays into both nostrils daily. 03/12/17   Breeback, Jade L, PA-C  ibuprofen (ADVIL,MOTRIN) 200 MG tablet Take 600 mg by mouth every 6 (six) hours as needed for pain.    [provider]  LORazepam (ATIVAN) 0.5 MG tablet Take 1 tablet (0.5 mg total) by mouth 2 (two) times daily as needed for anxiety. 02/14/17   Breeback, Royetta Car, PA-C  neomycin-polymyxin-hydrocortisone (CORTISPORIN) 3.5-10000-1 OTIC suspension Place 4 drops into the left ear 3 (three) times daily. For 7 days. May use in Right ear if symptoms develop. 06/23/17   Noe Gens, PA-C  valACYclovir (VALTREX) 1000 MG tablet Take 2 tablets twice a day for 1 day. 06/28/16   Breeback, Royetta Car, PA-C  Vilazodone HCl 20 MG TABS Take 1 tablet (20 mg total) by  mouth daily. 05/08/17   Breeback, Jade L, PA-C  zolpidem (AMBIEN) 10 MG tablet Take 1 tablet (10 mg total) by mouth at bedtime. 06/13/17   Donella Stade, PA-C   Meds Ordered and Administered this Visit  Medications - No data to display  BP (!) 142/89 (BP Location: Left Arm)   Pulse 62   Temp 98.7 F (37.1 C) (Oral)   Resp 16   Ht 5\' 8"  (1.727 m)   Wt 180 lb (81.6 kg)   SpO2 98%   BMI 27.37 kg/m  No data found.   Physical Exam  Constitutional: She is oriented to person, place, and time. She appears well-developed and well-nourished. No distress.  HENT:  Head: Normocephalic and atraumatic.  Right Ear: Tympanic membrane normal.  Left Ear: Tympanic membrane normal. There is tenderness. Tympanic membrane is not erythematous.  No middle ear effusion.  Nose: Nose normal.  Mouth/Throat: Uvula is midline, oropharynx is clear and moist and  mucous membranes are normal.  Left ear: mild edema at opening of ear canal with erythema and dried skin.   Eyes: EOM are normal.  Neck: Normal range of motion. Neck supple.  Cardiovascular: Normal rate and regular rhythm.   Pulmonary/Chest: Effort normal and breath sounds normal. No stridor. No respiratory distress. She has no wheezes. She has no rales.  Musculoskeletal: Normal range of motion.  Lymphadenopathy:    She has no cervical adenopathy.  Neurological: She is alert and oriented to person, place, and time.  Skin: Skin is warm and dry. She is not diaphoretic.  Psychiatric: She has a normal mood and affect. Her behavior is normal.  Nursing note and vitals reviewed.   Urgent Care Course     Procedures (including critical care time)  Labs Review Labs Reviewed - No data to display  Imaging Review No results found.    MDM   1. Acute swimmer's ear of left side    Hx and exam c/w acute otitis externa of Left ear  Rx: cortisporin drops  Home instructions provided F/u with PCP as needed.     Noe Gens, PA-C 06/23/17 1402    Noe Gens, PA-C 06/23/17 [phone]

## 2017-06-23 NOTE — ED Triage Notes (Signed)
Patient states swimming in pool and now C/O left ear pain and a full sensation in bilateral ears times two weeks

## 2017-07-11 ENCOUNTER — Other Ambulatory Visit: Payer: Self-pay | Admitting: Physician Assistant

## 2017-07-11 ENCOUNTER — Other Ambulatory Visit: Payer: Self-pay

## 2017-07-11 MED ORDER — ZOLPIDEM TARTRATE 10 MG PO TABS: 10.0000 mg | ORAL_TABLET | Freq: Every day | ORAL | 1 refills | Status: DC

## 2017-07-11 MED ORDER — ZOLPIDEM TARTRATE 10 MG PO TABS: 10.0000 mg | ORAL_TABLET | Freq: Every day | ORAL | 0 refills | Status: DC

## 2017-07-23 ENCOUNTER — Ambulatory Visit (INDEPENDENT_AMBULATORY_CARE_PROVIDER_SITE_OTHER): Payer: BLUE CROSS/BLUE SHIELD | Admitting: Psychiatry

## 2017-07-23 ENCOUNTER — Encounter: Payer: Self-pay | Admitting: Sports Medicine

## 2017-07-23 ENCOUNTER — Encounter (HOSPITAL_COMMUNITY): Payer: Self-pay | Admitting: Psychiatry

## 2017-07-23 ENCOUNTER — Ambulatory Visit (INDEPENDENT_AMBULATORY_CARE_PROVIDER_SITE_OTHER): Payer: BLUE CROSS/BLUE SHIELD | Admitting: Sports Medicine

## 2017-07-23 VITALS — BP 140/92 | HR 86 | Resp 18 | Ht 67.0 in | Wt 188.0 lb

## 2017-07-23 DIAGNOSIS — M7712 Lateral epicondylitis, left elbow: Secondary | ICD-10-CM

## 2017-07-23 DIAGNOSIS — Z8739 Personal history of other diseases of the musculoskeletal system and connective tissue: Secondary | ICD-10-CM

## 2017-07-23 DIAGNOSIS — F419 Anxiety disorder, unspecified: Secondary | ICD-10-CM

## 2017-07-23 DIAGNOSIS — F5102 Adjustment insomnia: Secondary | ICD-10-CM

## 2017-07-23 DIAGNOSIS — Z818 Family history of other mental and behavioral disorders: Secondary | ICD-10-CM | POA: Diagnosis not present

## 2017-07-23 DIAGNOSIS — F339 Major depressive disorder, recurrent, unspecified: Secondary | ICD-10-CM | POA: Diagnosis not present

## 2017-07-23 DIAGNOSIS — F411 Generalized anxiety disorder: Secondary | ICD-10-CM

## 2017-07-23 DIAGNOSIS — Z87891 Personal history of nicotine dependence: Secondary | ICD-10-CM

## 2017-07-23 HISTORY — DX: Lateral epicondylitis, left elbow: M77.12

## 2017-07-23 HISTORY — DX: Personal history of other diseases of the musculoskeletal system and connective tissue: Z87.39

## 2017-07-23 MED ORDER — MELOXICAM 15 MG PO TABS: ORAL_TABLET | ORAL | 3 refills | Status: DC

## 2017-07-23 NOTE — Progress Notes (Signed)
   Subjective:    I'm seeing this patient as a consultation for: Iran Planas, PA  CC: elbow pain  HPI: Patient reports pain in left elbow for 3 months. Patient describes the pain as sharp that comes and goes. At worst it is an 8/10 in severity. Patient reports occasional swelling at lateral elbow that is worse at night. Tramadol does not relieve the pain. Ibuprofen and icing helps somewhat. Patient works at [employer] and has taken the last 2 weeks off, but feels pain has continued to progress. Pain radiates to the forearm. Patient also reports some tingling in pinky finger.    Past medical history, Surgical history, Family history not pertinant except as noted below, Social history, Allergies, and medications have been entered into the medical record, reviewed, and no changes needed.   Review of Systems: No headache, visual changes, nausea, vomiting, diarrhea, constipation, dizziness, abdominal pain, skin rash, fevers, chills, night sweats, weight loss, swollen lymph nodes, body aches, joint swelling, muscle aches, chest pain, shortness of breath, mood changes, visual or auditory hallucinations.   Objective:   General: Well Developed, well nourished, and in no acute distress.  Neuro:  Extra-ocular muscles intact, able to move all 4 extremities, sensation grossly intact.  Deep tendon reflexes tested were normal. Psych: Alert and oriented, mood congruent with affect. ENT:  Ears and nose appear unremarkable.  Hearing grossly normal. Neck: Unremarkable overall appearance, trachea midline.  No visible thyroid enlargement. Eyes: Conjunctivae and lids appear unremarkable.  Pupils equal and round. Skin: Warm and dry, no rashes noted.  Cardiovascular: Pulses palpable, no extremity edema. MSK: Left elbow: No gross deformity or swelling on inspection Tender to palpation at lateral epicondyle Range of motion is full, 0-120 degrees with flexion and extension, lateral extension against  resistance reproduces pain at lateral epicondyle, wrist extension against resistance reproduces pain radiating to lateral epicondyle Strength is 5/5 with flexion and extension   Impression and Recommendations:   This case required medical decision making of moderate complexity.  51 year-old female with elbow pain. Given the physical exam and patient's characterization of the pain, this is most likely lateral epicondylitis. Patient has purchased a tennis elbow wrap and should continue to use this. Patient also advised to continue icing area and treat with NSAIDs. Patient will attend physical therapy and continue to monitor for improvement in her pain within the next few weeks.

## 2017-07-23 NOTE — Progress Notes (Signed)
Psychiatric Initial Adult Assessment   Patient Identification: Kayla Hood MRN:  [id number] Date of Evaluation:  07/23/2017 Referral Source: 51 years old currently married Caucasian female referred by primary care office for management of depression and anxiety  Chief Complaint:   Chief Complaint    Establish Care     Visit Diagnosis:    ICD-10-CM   1. Major depression, recurrent, chronic (HCC) F33.9   2. Anxiety F41.9   3. GAD (generalized anxiety disorder) F41.1   4. Adjustment insomnia F51.02     History of Present Illness:  Patient presents with history of depression she is currently on Lexapro. She endorses feeling down and depressed hopelessness and despair 2 years ago there was a significant bout of depression that she was about to get that in the hospital at that time her son left for college she left her job to move into a country home she did not like all of this and it led to her depressive. Since then she has recovered some but she still feels down at times she stranded give more time to gym workouts for arts activities that she does feel blah at times or dysphoric she does worry, excessive at times worries keep her awake she takes Ambien and now has been taking Ambien for the last 15 years she does not drink excessive coffee or use nicotine  First bout of depression as 1997 at that time she was given Zoloft at that time they were going through infertility. Zoloft did help but it is a small dose. Her husband is also in the [employer] at that time.  Aggravating factors; relationship, living in country. Son went to college Modifying factors; her paint work or activities. Severity of depression; 7 out of 10. 10 being no depression No history of physical or sexual abuse but had a distant relationship with her dad was emotionally distant No history of mania, except periods of involvment with cleaning. No risky activities Associated Signs/Symptoms: Depression Symptoms:   insomnia, difficulty concentrating, anxiety, (Hypo) Manic Symptoms:  Distractibility, Anxiety Symptoms:  Excessive Worry, Psychotic Symptoms:  denies PTSD Symptoms: NA  Past Psychiatric History: depression, anxiety   Previous Psychotropic Medications: Yes  VIIbryd made her agitated.  zoloft didn't help later Substance Abuse History in the last 12 months:  No.  Consequences of Substance Abuse: NA  Past Medical History:  Past Medical History:  Diagnosis Date  . Allergy   . Chicken pox   . Depression   . Hypertension   . Incontinence of urine   . UTI (lower urinary tract infection)     Past Surgical History:  Procedure Laterality Date  . ABLATION    . CERVICAL FUSION  2012  . pelvic laproscopic surgeries     4 prior to 2008    Family Psychiatric History: Dad; alcohol use in past. Depression. MOm side has depression and some bipolar in family  Family History:  Family History  Problem Relation Age of Onset  . Hyperlipidemia Mother   . Ulcerative colitis Mother   . Heart disease Father 68       CAD  . Hypertension Father   . Diverticulitis Father   . Hypertension Brother   . Cancer Maternal Grandfather 50       Stomach Cancer  . Cancer Paternal Grandmother 35       Uterine Cancer  . Stroke Paternal Grandfather 81       CVA  . Cancer Maternal Grandmother     Social  History:   Social History   Social History  . Marital status: Married    Spouse name: Pebbles Zeiders  . Number of children: 1  . Years of education: 71   Occupational History  . Preschool Teacher for [employer]   Social History Main Topics  . Smoking status: Former Smoker    Packs/day: 0.25    Years: 5.00    Types: Cigarettes    Quit date: 03/21/2000  . Smokeless tobacco: Never Used  . Alcohol use No     Comment: Occasionally   . Drug use: No  . Sexual activity: Yes    Birth control/ protection: None   Other Topics Concern  . None   Social History  Narrative  . None    Additional Social History: grew up with her parents but dad was emotionally distant. She did get close attached to mom she had 1 sibling. She is currently doing in [place] also she is arts work, gardening and housework, arts work  Allergies:   Allergies  Allergen Reactions  . Promethazine Anaphylaxis and Other (See Comments)    Other Reaction: OTHER REACTION  . Phenergan [Promethazine Hcl] Rash    Metabolic Disorder Labs: No results found for: HGBA1C, MPG No results found for: PROLACTIN Lab Results  Component Value Date   CHOL 253 (H) 12/22/2015   TRIG 186 (H) 12/22/2015   HDL 55 12/22/2015   CHOLHDL 4.6 12/22/2015   VLDL 37 (H) 12/22/2015   LDLCALC 161 (H) 12/22/2015   LDLCALC 129 (H) 07/08/2015     Current Medications: Current Outpatient Prescriptions  Medication Sig Dispense Refill  . escitalopram (LEXAPRO) 10 MG tablet Take 10 mg by mouth daily.    Marland Kitchen ibuprofen (ADVIL,MOTRIN) 200 MG tablet Take 600 mg by mouth every 6 (six) hours as needed for pain.    Marland Kitchen LORazepam (ATIVAN) 0.5 MG tablet Take 1 tablet (0.5 mg total) by mouth 2 (two) times daily as needed for anxiety. 15 tablet 0  . neomycin-polymyxin-hydrocortisone (CORTISPORIN) 3.5-10000-1 OTIC suspension Place 4 drops into the left ear 3 (three) times daily. For 7 days. May use in Right ear if symptoms develop. 10 mL 0  . zolpidem (AMBIEN) 10 MG tablet Take 1 tablet (10 mg total) by mouth at bedtime. Due for follow up visit 30 tablet 0  . valACYclovir (VALTREX) 1000 MG tablet Take 2 tablets twice a day for 1 day. (Patient not taking: Reported on 07/23/2017) 20 tablet 0   No current facility-administered medications for this visit.     Neurologic: Headache: No Seizure: No Paresthesias:No  Musculoskeletal: Strength & Muscle Tone: within normal limits Gait & Station: normal Patient leans: no lean  Psychiatric Specialty Exam: Review of Systems  Cardiovascular: Negative for chest  pain.  Skin: Negative for rash.  Neurological: Negative for tremors.  Psychiatric/Behavioral: Positive for depression.    Blood pressure (!) 140/92, pulse 86, resp. rate 18, height 5\' 7"  (1.702 m), weight 188 lb (85.3 kg), SpO2 97 %.Body mass index is 29.44 kg/m.  General Appearance: Neat  Eye Contact:  Fair  Speech:  Normal Rate  Volume:  Normal  Mood:  Dysphoric somewhat  Affect:  Congruent  Thought Process:  Goal Directed  Orientation:  Full (Time, Place, and Person)  Thought Content:  Rumination  Suicidal Thoughts:  No  Homicidal Thoughts:  No  Memory:  Immediate;   Fair Recent;   Fair  Judgement:  Fair  Insight:  Fair  Psychomotor Activity:  Normal  Concentration:  Concentration: Fair and Attention Span: Fair  Recall:  [employer] of Knowledge:Fair  Language: Fair  Akathisia:  Negative  Handed:  Right  AIMS (if indicated):    Assets:  Desire for Improvement  ADL's:  Intact  Cognition: WNL  Sleep:  Fair with meds    Treatment Plan Summary: Medication management and Plan as follows  1. Major depression, recurrent , moderate: lexapro does help but feels still dysphoric at times . Increase to 30mg . Has meds, can call for refill 2. GAD: worries excessively at times. Increase lexapro 3. Insomnia: reviewed sleep hygiene, on ambien for 15 years not ready to cut down. Will work on it once depression improves more  Can augment with wellbutrin during the  Day for depression if needed Has tried to cut down lexapro and depression got worse Not interested in therapy  More than 50% of time spent in counseling and coordination of care including medication education and review of side effects. Patient will try to have more ME time and increase acitivities that gives her motivation.  FUI4-6 weeks or earlier if needed   Merian Capron, MD 7/30/20189:36 AM

## 2017-07-23 NOTE — Assessment & Plan Note (Signed)
Formal physical therapy, meloxicam, she does have a tennis elbow brace. If insufficient relief after a few days of meloxicam I'm happy to call in prednisone per her request. If after a couple of weeks of physical therapy, meloxicam she's not having any sufficient relief then I'm happy to do an injection. Otherwise return to see me in one month.

## 2017-07-24 ENCOUNTER — Telehealth: Payer: Self-pay | Admitting: Physician Assistant

## 2017-07-24 NOTE — Telephone Encounter (Addendum)
Kayla Hood Pt called. She saw Dr T yesterday and wants notes for that visit faxed over to [facility] and their fax # is [phone].  Thank you.

## 2017-08-20 ENCOUNTER — Encounter: Payer: Self-pay | Admitting: Physician Assistant

## 2017-08-21 ENCOUNTER — Encounter (HOSPITAL_COMMUNITY): Payer: Self-pay | Admitting: *Deleted

## 2017-08-21 ENCOUNTER — Telehealth (HOSPITAL_COMMUNITY): Payer: Self-pay | Admitting: Psychiatry

## 2017-08-21 MED ORDER — ESCITALOPRAM OXALATE 10 MG PO TABS: 15.0000 mg | ORAL_TABLET | Freq: Every day | ORAL | 0 refills | Status: DC

## 2017-08-21 NOTE — Telephone Encounter (Signed)
Medication refill- pt return call to office. Pt states she is currently taking Lexapro 15mg .  Per Dr. De Nurse, please send new rx for Lexapro 10mg , #45 to [facility]). Called and informed pt of refill status. Pt's next apt is schedule on 09/03/17. Pt verbalizes understanding.

## 2017-08-21 NOTE — Telephone Encounter (Signed)
Lvm for pt to contact office.  Per Dr. De Nurse, pt will need to verify correct dose of Lexapro.  Refill will need to be sent to [facility]).

## 2017-08-21 NOTE — Telephone Encounter (Signed)
Can send 

## 2017-08-21 NOTE — Telephone Encounter (Signed)
Needs refill on lexapro 15mg   [facility] [place]

## 2017-08-30 ENCOUNTER — Telehealth (HOSPITAL_COMMUNITY): Payer: Self-pay | Admitting: *Deleted

## 2017-08-30 NOTE — Telephone Encounter (Signed)
Prior auth received for Lexapro. Submitted prior auth online through [facility]. PA # [id number]. No prior Josem Kaufmann is need. Notified pharmacy.

## 2017-09-03 ENCOUNTER — Ambulatory Visit (INDEPENDENT_AMBULATORY_CARE_PROVIDER_SITE_OTHER): Payer: BLUE CROSS/BLUE SHIELD

## 2017-09-03 ENCOUNTER — Ambulatory Visit (INDEPENDENT_AMBULATORY_CARE_PROVIDER_SITE_OTHER): Payer: BLUE CROSS/BLUE SHIELD | Admitting: Psychiatry

## 2017-09-03 ENCOUNTER — Encounter (HOSPITAL_COMMUNITY): Payer: Self-pay | Admitting: Psychiatry

## 2017-09-03 ENCOUNTER — Encounter: Payer: Self-pay | Admitting: Sports Medicine

## 2017-09-03 ENCOUNTER — Ambulatory Visit (INDEPENDENT_AMBULATORY_CARE_PROVIDER_SITE_OTHER): Payer: BLUE CROSS/BLUE SHIELD | Admitting: Sports Medicine

## 2017-09-03 VITALS — BP 140/84 | HR 66 | Resp 16 | Ht 67.0 in | Wt 190.0 lb

## 2017-09-03 VITALS — BP 148/96 | HR 80 | Wt 190.0 lb

## 2017-09-03 DIAGNOSIS — R200 Anesthesia of skin: Secondary | ICD-10-CM | POA: Diagnosis not present

## 2017-09-03 DIAGNOSIS — M5013 Cervical disc disorder with radiculopathy, cervicothoracic region: Secondary | ICD-10-CM | POA: Diagnosis not present

## 2017-09-03 DIAGNOSIS — Z87891 Personal history of nicotine dependence: Secondary | ICD-10-CM | POA: Diagnosis not present

## 2017-09-03 DIAGNOSIS — F411 Generalized anxiety disorder: Secondary | ICD-10-CM | POA: Diagnosis not present

## 2017-09-03 DIAGNOSIS — Z811 Family history of alcohol abuse and dependence: Secondary | ICD-10-CM

## 2017-09-03 DIAGNOSIS — F419 Anxiety disorder, unspecified: Secondary | ICD-10-CM

## 2017-09-03 DIAGNOSIS — Z600 Problems of adjustment to life-cycle transitions: Secondary | ICD-10-CM | POA: Diagnosis not present

## 2017-09-03 DIAGNOSIS — F5102 Adjustment insomnia: Secondary | ICD-10-CM | POA: Diagnosis not present

## 2017-09-03 DIAGNOSIS — F339 Major depressive disorder, recurrent, unspecified: Secondary | ICD-10-CM

## 2017-09-03 DIAGNOSIS — Z818 Family history of other mental and behavioral disorders: Secondary | ICD-10-CM | POA: Diagnosis not present

## 2017-09-03 DIAGNOSIS — Z8739 Personal history of other diseases of the musculoskeletal system and connective tissue: Secondary | ICD-10-CM

## 2017-09-03 DIAGNOSIS — Z79899 Other long term (current) drug therapy: Secondary | ICD-10-CM

## 2017-09-03 HISTORY — DX: Anesthesia of skin: R20.0

## 2017-09-03 MED ORDER — ESCITALOPRAM OXALATE 20 MG PO TABS: 20.0000 mg | ORAL_TABLET | Freq: Every day | ORAL | 1 refills | Status: DC

## 2017-09-03 NOTE — Progress Notes (Signed)
   Subjective:    I'm seeing this patient as a consultation for:  Iran Planas, PA-C  CC: Hand numbness  HPI: This is a pleasant 51 year old female, for some months now she's had increasing numbness and tingling in her left fourth and fifth fingers, starting at the elbow. She does have a history of a multilevel cervical fusion.  In addition she is here to follow-up her left elbow, pain was laterally over the common extensor tendon, she has unfortunately failed medications, bracing, rehabilitation.  Past medical history, Surgical history, Family history not pertinant except as noted below, Social history, Allergies, and medications have been entered into the medical record, reviewed, and no changes needed.   Review of Systems: No headache, visual changes, nausea, vomiting, diarrhea, constipation, dizziness, abdominal pain, skin rash, fevers, chills, night sweats, weight loss, swollen lymph nodes, body aches, joint swelling, muscle aches, chest pain, shortness of breath, mood changes, visual or auditory hallucinations.   Objective:   General: Well Developed, well nourished, and in no acute distress.  Neuro:  Extra-ocular muscles intact, able to move all 4 extremities, sensation grossly intact.  Deep tendon reflexes tested were normal. Psych: Alert and oriented, mood congruent with affect. ENT:  Ears and nose appear unremarkable.  Hearing grossly normal. Neck: Unremarkable overall appearance, trachea midline.  No visible thyroid enlargement. Eyes: Conjunctivae and lids appear unremarkable.  Pupils equal and round. Skin: Warm and dry, no rashes noted.  Cardiovascular: Pulses palpable, no extremity edema. Left Elbow: Unremarkable to inspection. Range of motion full pronation, supination, flexion, extension. Strength is full to all of the above directions Stable to varus, valgus stress. Negative moving valgus stress test. Tender to palpation at the common extensor tendon origin Ulnar nerve  does not sublux. Negative cubital tunnel Tinel's.  Procedure: Real-time Ultrasound Guided Injection of left common extensor tendon origin Device: GE Logiq E  Verbal informed consent obtained.  Time-out conducted.  Noted no overlying erythema, induration, or other signs of local infection.  Skin prepped in a sterile fashion.  Local anesthesia: Topical Ethyl chloride.  With sterile technique and under real time ultrasound guidance:  Noted proximal tendon calcifications, I guided the 25-gauge needle and injected medication both superficial to and deep to the extensor tendon for a total of 1 mL Kenalog 40, 1 mL lidocaine, 1 mL bupivacaine. Completed without difficulty  Pain immediately resolved suggesting accurate placement of the medication.  Advised to call if fevers/chills, erythema, induration, drainage, or persistent bleeding.  Images permanently stored and available for review in the ultrasound unit.  Impression: Technically successful ultrasound guided injection.  Impression and Recommendations:   This case required medical decision making of moderate complexity.  History of left tennis elbow Failure of conservative measures, ultrasound guided injection as above.  Numbness of left hand C8 distribution versus ulnar nerve. She does have a history of a multilevel cervical fusion. Symptoms have been present for a long time now, it sounds as her neurosurgeon was planning a repeat MRI. I'm going to order a nerve conduction study, repeat x-rays, depending on what we see I may order an MRI with and without contrast.  ___________________________________________ Gwen Her. [name], M.D., ABFM., CAQSM. Primary Care and [place] Instructor of [place] [facility]

## 2017-09-03 NOTE — Assessment & Plan Note (Signed)
C8 distribution versus ulnar nerve. She does have a history of a multilevel cervical fusion. Symptoms have been present for a long time now, it sounds as her neurosurgeon was planning a repeat MRI. I'm going to order a nerve conduction study, repeat x-rays, depending on what we see I may order an MRI with and without contrast.

## 2017-09-03 NOTE — Assessment & Plan Note (Signed)
Failure of conservative measures, ultrasound guided injection as above.

## 2017-09-03 NOTE — Progress Notes (Signed)
[facility] Outpatient Follow up visit   Patient Identification: Kayla Hood MRN:  [id number] Date of Evaluation:  09/03/2017 Referral Source: 51 years old currently married Caucasian female initially referred by primary care office for management of depression and anxiety  Chief Complaint:   Chief Complaint    Follow-up     Visit Diagnosis:    ICD-10-CM   1. Major depression, recurrent, chronic (HCC) F33.9   2. GAD (generalized anxiety disorder) F41.1   3. Adjustment insomnia F51.02   4. Anxiety F41.9     History of Present Illness:   Patient is presented with anxiety and depression related to being more lonely since her son went to college last visit we increased the Lexapro to 15 mg that has helped. First bout of depression as 1997 at that time she was given Zoloft at that time they were going through infertility. Zoloft did help but it is a small dose. Her husband is also in the [employer] at that time.  Aggravating factors;[place] country. Relationship. Son in college Modifying factors; paint and gym Severity of depression; improved Sleep difficult without ambien   Past Psychiatric History: depression, anxiety   Previous Psychotropic Medications: Yes  VIIbryd made her agitated.  zoloft didn't help later Substance Abuse History in the last 12 months:  No.  Consequences of Substance Abuse: NA  Past Medical History:  Past Medical History:  Diagnosis Date  . Allergy   . Chicken pox   . Depression   . Hypertension   . Incontinence of urine   . UTI (lower urinary tract infection)     Past Surgical History:  Procedure Laterality Date  . ABLATION    . CERVICAL FUSION  2012  . pelvic laproscopic surgeries     4 prior to 2008    Family Psychiatric History: Dad; alcohol use in past. Depression. MOm side has depression and some bipolar in family  Family History:  Family History  Problem Relation Age of Onset  . Hyperlipidemia Mother   . Ulcerative colitis Mother   .  Heart disease Father 73       CAD  . Hypertension Father   . Diverticulitis Father   . Hypertension Brother   . Cancer Maternal Grandfather 78       Stomach Cancer  . Cancer Paternal Grandmother 43       Uterine Cancer  . Stroke Paternal Grandfather 47       CVA  . Cancer Maternal Grandmother     Social History:   Social History   Social History  . Marital status: Married    Spouse name: Noriah Osgood  . Number of children: 1  . Years of education: 63   Occupational History  . Preschool Teacher for [employer]   Social History Main Topics  . Smoking status: Former Smoker    Packs/day: 0.25    Years: 5.00    Types: Cigarettes    Quit date: 03/21/2000  . Smokeless tobacco: Never Used  . Alcohol use No     Comment: Occasionally   . Drug use: No  . Sexual activity: Yes    Birth control/ protection: None   Other Topics Concern  . None   Social History Narrative  . None     Allergies:   Allergies  Allergen Reactions  . Promethazine Anaphylaxis and Other (See Comments)    Other Reaction: OTHER REACTION  . Phenergan [Promethazine Hcl] Rash    Metabolic  Disorder Labs: No results found for: HGBA1C, MPG No results found for: PROLACTIN Lab Results  Component Value Date   CHOL 253 (H) 12/22/2015   TRIG 186 (H) 12/22/2015   HDL 55 12/22/2015   CHOLHDL 4.6 12/22/2015   VLDL 37 (H) 12/22/2015   LDLCALC 161 (H) 12/22/2015   LDLCALC 129 (H) 07/08/2015     Current Medications: Current Outpatient Prescriptions  Medication Sig Dispense Refill  . escitalopram (LEXAPRO) 20 MG tablet Take 1 tablet (20 mg total) by mouth daily. 30 tablet 1  . ibuprofen (ADVIL,MOTRIN) 200 MG tablet Take 600 mg by mouth every 6 (six) hours as needed for pain.    . valACYclovir (VALTREX) 1000 MG tablet Take 2 tablets twice a day for 1 day. 20 tablet 0  . zolpidem (AMBIEN) 10 MG tablet Take 1 tablet (10 mg total) by mouth at bedtime. Due for follow up visit  30 tablet 0   No current facility-administered medications for this visit.       Psychiatric Specialty Exam: Review of Systems  Cardiovascular: Negative for palpitations.  Skin: Negative for rash.  Neurological: Negative for tremors.  Psychiatric/Behavioral: Positive for depression.    Blood pressure 140/84, pulse 66, resp. rate 16, height 5\' 7"  (1.702 m), weight 190 lb (86.2 kg), SpO2 97 %.Body mass index is 29.76 kg/m.  General Appearance: Neat  Eye Contact:  Fair  Speech:  Normal Rate  Volume:  Normal  Mood:  Less dysphoric  Affect:  More reactive  Thought Process:  Goal Directed  Orientation:  Full (Time, Place, and Person)  Thought Content:  Rumination  Suicidal Thoughts:  No  Homicidal Thoughts:  No  Memory:  Immediate;   Fair Recent;   Fair  Judgement:  Fair  Insight:  Fair  Psychomotor Activity:  Normal  Concentration:  Concentration: Fair and Attention Span: Fair  Recall:  [employer] of Knowledge:Fair  Language: Fair  Akathisia:  Negative  Handed:  Right  AIMS (if indicated):    Assets:  Desire for Improvement  ADL's:  Intact  Cognition: WNL  Sleep:  Fair with meds    Treatment Plan Summary: Medication management and Plan as follows  1. Major depression, recurrent , moderate: somewhat improved. Increase lexapro to 20mg  2. GAD: some improved. Increase lexapro to 20mg   3. Insomnia: reviewed sleep hygiene,  Continue small dose of ambien   She is somewhat concerned about her depression will augment with Wellbutrin if needed during winter,   follow-up in 6 weeks as of now will continue Lexapro and increase that  Provided  supportive therapy discussed. Side effects Brenen Beigel, MD 9/10/201810:00 AM

## 2017-09-04 ENCOUNTER — Ambulatory Visit (INDEPENDENT_AMBULATORY_CARE_PROVIDER_SITE_OTHER): Payer: BLUE CROSS/BLUE SHIELD | Admitting: Physician Assistant

## 2017-09-04 ENCOUNTER — Encounter: Payer: Self-pay | Admitting: Physician Assistant

## 2017-09-04 VITALS — BP 145/90 | HR 66 | Wt 189.0 lb

## 2017-09-04 DIAGNOSIS — F5101 Primary insomnia: Secondary | ICD-10-CM

## 2017-09-04 DIAGNOSIS — R5383 Other fatigue: Secondary | ICD-10-CM

## 2017-09-04 MED ORDER — DICLOFENAC SODIUM 1 % TD GEL: 4.0000 g | Freq: Four times a day (QID) | TRANSDERMAL | 3 refills | Status: DC

## 2017-09-04 MED ORDER — ZOLPIDEM TARTRATE 10 MG PO TABS: 10.0000 mg | ORAL_TABLET | Freq: Every day | ORAL | 5 refills | Status: DC

## 2017-09-04 NOTE — Progress Notes (Signed)
Subjective:    Patient ID: Kayla Hood, female    DOB: December 07, 1966, 51 y.o.   MRN: [id number]  HPI Pt is a 51 yo female who presents to the clinic to discuss insomnia.   Insomnia- pt needs refills. She has been on 15 years. She takes 5mg  daily. She is doing well and controlled. If she does not take nightly she struggles to fall asleep.   Pt is having some fatigue as well. No family or personal hx of thyroid disease. She is not actively bleeding. She is being seen for some left hand numbness that could be coming from her neck and past surgeries. She plans on starting PT soon. She sleeps well at night. She also feels like her depression is controlled. She just wonders if anything else could be off. She does not snore.   .. Active Ambulatory Problems    Diagnosis Date Noted  . Abnormal chest x-ray 03/03/2013  . Irregular heart beat 03/03/2013  . Sleep disturbance 03/03/2013  . General medical exam 03/03/2013  . Palpitations 03/21/2013  . Left hip pain 06/28/2013  . Tendonitis of shoulder 09/01/2013  . Hives 01/26/2014  . RLQ abdominal pain 01/26/2014  . Facial edema 02/04/2014  . Viral URI 03/09/2015  . Anxiety state 06/07/2015  . Depression with anxiety 07/08/2015  . Endometriosis 07/08/2015  . Fibroid 07/08/2015  . Exposure to blood or body fluid 11/24/2013  . 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 Hidradenitis suppurativa 12/22/2015  . Hot flashes due to surgical menopause 12/22/2015  . Hyperlipidemia 12/22/2015  . Essential hypertension, benign 12/22/2015  . Vaginal dryness, menopausal 12/22/2015  . Anxiety 12/22/2015  . Screening for diabetes mellitus 12/22/2015  . Nodule of finger of both hands 08/18/2016  . Abnormal weight gain 11/18/2016  . Diverticulitis of large intestine without perforation or abscess 01/23/2017  . Ear itching 03/12/2017  . Major depression, recurrent, chronic ([place]) 05/09/2017  . History of left tennis elbow 07/23/2017  . Numbness of left hand  09/03/2017   Resolved Ambulatory Problems    Diagnosis Date Noted  . No Resolved Ambulatory Problems   Past Medical History:  Diagnosis Date  . Allergy   . Chicken pox   . Depression   . Hypertension   . Incontinence of urine   . UTI (lower urinary tract infection)       Review of Systems See HPI.     Objective:   Physical Exam  Constitutional: She is oriented to person, place, and time. She appears well-developed and well-nourished.  HENT:  Head: Normocephalic and atraumatic.  Neck: Normal range of motion. Neck supple. No thyromegaly present.  Cardiovascular: Normal rate, regular rhythm and normal heart sounds.   Pulmonary/Chest: Effort normal and breath sounds normal.  Neurological: She is alert and oriented to person, place, and time.  Psychiatric: She has a normal mood and affect. Her behavior is normal.          Assessment & Plan:  Marland KitchenMarland KitchenJustin was seen today for f/u ambien.  Diagnoses and all orders for this visit:  Primary insomnia -     zolpidem (AMBIEN) 10 MG tablet; Take 1 tablet (10 mg total) by mouth at bedtime.  No energy -     CBC -     Comprehensive metabolic panel -     C-reactive protein -     Ferritin -     Sedimentation rate -     TSH -     Vitamin B12 -  Vit D  25 hydroxy (rtn osteoporosis monitoring)  Other orders -     diclofenac sodium (VOLTAREN) 1 % GEL; Apply 4 g topically 4 (four) times daily.   Refilled ambein. Written for 10mg  but she only takes 5mg .   Will get labs to evaluate fatigue.  .. Depression screen [facility] 2/9 09/04/2017 09/03/2017 03/12/2017 08/16/2016 06/28/2016  Decreased Interest 0 0 0 0 0  Down, Depressed, Hopeless 0 0 0 0 0  PHQ - 2 Score 0 0 0 0 0  Altered sleeping - - - 0 -  Tired, decreased energy - - - 0 -  Change in appetite - - - 0 -  Feeling bad or failure about yourself  - - - 0 -  Trouble concentrating - - - 0 -  Moving slowly or fidgety/restless - - - 0 -  Suicidal thoughts - - - 0 -  PHQ-9 Score - - -  0 -  Some encounter information is confidential and restricted. Go to Review Flowsheets activity to see all data.   Depression is well controlled.  It could be coming from the chronic pain right now with left arm and neck. Pt is being managed by Dr. Darene Lamer for this.

## 2017-09-05 LAB — COMPREHENSIVE METABOLIC PANEL
AG RATIO: 1.7 (calc) (ref 1.0–2.5)
ALBUMIN MSPROF: 4.7 g/dL (ref 3.6–5.1)
ALT: 15 U/L (ref 6–29)
AST: 15 U/L (ref 10–35)
Alkaline phosphatase (APISO): 94 U/L (ref 33–115)
BUN: 10 mg/dL (ref 7–25)
CO2: 25 mmol/L (ref 20–32)
Calcium: 10 mg/dL (ref 8.6–10.2)
Chloride: 103 mmol/L (ref 98–110)
Creat: 0.62 mg/dL (ref 0.50–1.10)
GLOBULIN: 2.7 g/dL (ref 1.9–3.7)
GLUCOSE: 102 mg/dL — AB (ref 65–99)
POTASSIUM: 4.1 mmol/L (ref 3.5–5.3)
SODIUM: 137 mmol/L (ref 135–146)
TOTAL PROTEIN: 7.4 g/dL (ref 6.1–8.1)
Total Bilirubin: 0.7 mg/dL (ref 0.2–1.2)

## 2017-09-05 LAB — CBC
HCT: 40.7 % (ref 35.0–45.0)
Hemoglobin: 14.1 g/dL (ref 11.7–15.5)
MCH: 30.8 pg (ref 27.0–33.0)
MCHC: 34.6 g/dL (ref 32.0–36.0)
MCV: 88.9 fL (ref 80.0–100.0)
MPV: 9.7 fL (ref 7.5–12.5)
Platelets: 365 10*3/uL (ref 140–400)
RBC: 4.58 10*6/uL (ref 3.80–5.10)
RDW: 13.4 % (ref 11.0–15.0)
WBC: 8.6 10*3/uL (ref 3.8–10.8)

## 2017-09-05 LAB — TSH: TSH: 0.53 mIU/L

## 2017-09-05 LAB — VITAMIN B12: VITAMIN B 12: 680 pg/mL (ref 200–1100)

## 2017-09-05 LAB — C-REACTIVE PROTEIN: CRP: 1.4 mg/L (ref <8.0)

## 2017-09-05 LAB — FERRITIN: Ferritin: 67 ng/mL (ref 10–232)

## 2017-09-05 LAB — SEDIMENTATION RATE: Sed Rate: 19 mm/h (ref 0–20)

## 2017-09-05 LAB — VITAMIN D 25 HYDROXY (VIT D DEFICIENCY, FRACTURES): Vit D, 25-Hydroxy: 38 ng/mL (ref 30–100)

## 2017-09-05 NOTE — Progress Notes (Signed)
Call pt: iron stores good. Vitamin D in normal range but down from 3 years ago. Keep supplementing 2000units daily.  B12 good.  Thyroid good.  Labs look really good.

## 2017-09-20 ENCOUNTER — Telehealth: Payer: Self-pay

## 2017-09-20 NOTE — Telephone Encounter (Signed)
Called pt and left voice message for the pt to call the office regarding med prior auth.

## 2017-09-20 NOTE — Telephone Encounter (Signed)
Pre Authorization was sent to Cover My Meds. Key: [id number] - PA Case ID: [id number] - Rx #: [id number]

## 2017-10-15 ENCOUNTER — Encounter (HOSPITAL_COMMUNITY): Payer: Self-pay | Admitting: Psychiatry

## 2017-10-15 ENCOUNTER — Ambulatory Visit (HOSPITAL_COMMUNITY): Payer: Self-pay | Admitting: Psychiatry

## 2017-10-15 ENCOUNTER — Ambulatory Visit (INDEPENDENT_AMBULATORY_CARE_PROVIDER_SITE_OTHER): Payer: BLUE CROSS/BLUE SHIELD | Admitting: Psychiatry

## 2017-10-15 VITALS — BP 118/72 | HR 76 | Resp 16 | Ht 67.0 in | Wt 189.0 lb

## 2017-10-15 DIAGNOSIS — Z79899 Other long term (current) drug therapy: Secondary | ICD-10-CM | POA: Diagnosis not present

## 2017-10-15 DIAGNOSIS — F411 Generalized anxiety disorder: Secondary | ICD-10-CM | POA: Diagnosis not present

## 2017-10-15 DIAGNOSIS — Z818 Family history of other mental and behavioral disorders: Secondary | ICD-10-CM

## 2017-10-15 DIAGNOSIS — Z811 Family history of alcohol abuse and dependence: Secondary | ICD-10-CM

## 2017-10-15 DIAGNOSIS — F419 Anxiety disorder, unspecified: Secondary | ICD-10-CM

## 2017-10-15 DIAGNOSIS — Z87891 Personal history of nicotine dependence: Secondary | ICD-10-CM | POA: Diagnosis not present

## 2017-10-15 DIAGNOSIS — F331 Major depressive disorder, recurrent, moderate: Secondary | ICD-10-CM | POA: Diagnosis not present

## 2017-10-15 DIAGNOSIS — Z600 Problems of adjustment to life-cycle transitions: Secondary | ICD-10-CM | POA: Diagnosis not present

## 2017-10-15 DIAGNOSIS — F5102 Adjustment insomnia: Secondary | ICD-10-CM

## 2017-10-15 DIAGNOSIS — F339 Major depressive disorder, recurrent, unspecified: Secondary | ICD-10-CM

## 2017-10-15 MED ORDER — ESCITALOPRAM OXALATE 20 MG PO TABS: 20.0000 mg | ORAL_TABLET | Freq: Every day | ORAL | 1 refills | Status: DC

## 2017-10-15 NOTE — Progress Notes (Signed)
[facility] Outpatient Follow up visit   Patient Identification: Kayla Hood MRN:  [id number] Date of Evaluation:  10/15/2017 Referral Source: 51 years old currently married Caucasian female initially referred by primary care office for management of depression and anxiety  Chief Complaint:   Chief Complaint    Follow-up     Visit Diagnosis:    ICD-10-CM   1. Major depression, recurrent, chronic (HCC) F33.9   2. GAD (generalized anxiety disorder) F41.1   3. Adjustment insomnia F51.02   4. Anxiety F41.9     History of Present Illness:   Patient is presented with anxiety and depression related to being more lonely since her son went to college last visit we increased the Lexapro to 15 mg that has helped. First bout of depression as 1997 at that time she was given Zoloft at that time they were going through infertility. Zoloft did help but it is a small dose. Her husband is also in the [employer] at that time.  Increase lexapro has helped depression. conern is vivid dreams.   [place]ravating [place]. Son in college Modifying factors; paint and gym Severity of depression; improved Sleep difficult without ambien   Past Psychiatric History: depression, anxiety   Previous Psychotropic Medications: Yes  VIIbryd made her agitated.  zoloft didn't help later Substance Abuse History in the last 12 months:  No.  Consequences of Substance Abuse: NA  Past Medical History:  Past Medical History:  Diagnosis Date  . Allergy   . Chicken pox   . Depression   . Hypertension   . Incontinence of urine   . UTI (lower urinary tract infection)     Past Surgical History:  Procedure Laterality Date  . ABLATION    . CERVICAL FUSION  2012  . pelvic laproscopic surgeries     4 prior to 2008    Family Psychiatric History: Dad; alcohol use in past. Depression. MOm side has depression and some bipolar in family  Family History:  Family History  Problem Relation Age of  Onset  . Hyperlipidemia Mother   . Ulcerative colitis Mother   . Heart disease Father 32       CAD  . Hypertension Father   . Diverticulitis Father   . Hypertension Brother   . Cancer Maternal Grandfather 47       Stomach Cancer  . Cancer Paternal Grandmother 75       Uterine Cancer  . Stroke Paternal Grandfather 68       CVA  . Cancer Maternal Grandmother     Social History:   Social History   Social History  . Marital status: Married    Spouse name: Dulcemaria Bula  . Number of children: 1  . Years of education: 11   Occupational History  . Preschool Teacher for [employer]   Social History Main Topics  . Smoking status: Former Smoker    Packs/day: 0.25    Years: 5.00    Types: Cigarettes    Quit date: 03/21/2000  . Smokeless tobacco: Never Used  . Alcohol use No     Comment: Occasionally   . Drug use: No  . Sexual activity: Yes    Birth control/ protection: None   Other Topics Concern  . None   Social History Narrative  . None     Allergies:   Allergies  Allergen Reactions  . Promethazine Anaphylaxis and Other (See Comments)    Other Reaction: OTHER  REACTION  . Phenergan [Promethazine Hcl] Rash    Metabolic Disorder Labs: No results found for: HGBA1C, MPG No results found for: PROLACTIN Lab Results  Component Value Date   CHOL 253 (H) 12/22/2015   TRIG 186 (H) 12/22/2015   HDL 55 12/22/2015   CHOLHDL 4.6 12/22/2015   VLDL 37 (H) 12/22/2015   LDLCALC 161 (H) 12/22/2015   LDLCALC 129 (H) 07/08/2015     Current Medications: Current Outpatient Prescriptions  Medication Sig Dispense Refill  . escitalopram (LEXAPRO) 20 MG tablet Take 1 tablet (20 mg total) by mouth daily. Can take half twice a day 30 tablet 1  . zolpidem (AMBIEN) 10 MG tablet Take 1 tablet (10 mg total) by mouth at bedtime. 30 tablet 5  . diclofenac sodium (VOLTAREN) 1 % GEL Apply 4 g topically 4 (four) times daily. (Patient not taking: Reported on  10/15/2017) 1 Tube 3  . ibuprofen (ADVIL,MOTRIN) 200 MG tablet Take 600 mg by mouth every 6 (six) hours as needed for pain.    . valACYclovir (VALTREX) 1000 MG tablet Take 2 tablets twice a day for 1 day. (Patient not taking: Reported on 10/15/2017) 20 tablet 0   No current facility-administered medications for this visit.       Psychiatric Specialty Exam: Review of Systems  Cardiovascular: Negative for chest pain.  Skin: Negative for rash.  Neurological: Negative for tremors.    Blood pressure 118/72, pulse 76, resp. rate 16, height 5\' 7"  (1.702 m), weight 189 lb (85.7 kg), SpO2 96 %.Body mass index is 29.6 kg/m.  General Appearance: Neat  Eye Contact:  Fair  Speech:  Normal Rate  Volume:  Normal  Mood:  better  Affect:  reactive  Thought Process:  Goal Directed  Orientation:  Full (Time, Place, and Person)  Thought Content:  Rumination  Suicidal Thoughts:  No  Homicidal Thoughts:  No  Memory:  Immediate;   Fair Recent;   Fair  Judgement:  Fair  Insight:  Fair  Psychomotor Activity:  Normal  Concentration:  Concentration: Fair and Attention Span: Fair  Recall:  [employer] of Knowledge:Fair  Language: Fair  Akathisia:  Negative  Handed:  Right  AIMS (if indicated):    Assets:  Desire for Improvement  ADL's:  Intact  Cognition: WNL  Sleep:  Fair with meds    Treatment Plan Summary: Medication management and Plan as follows  1. Major depression, recurrent , moderate: improved. cotinue lexparo take half bid and evaluate if dreams are still vivid. May need to cut down if so. 2. GAD: not worse. Continue lexapro  3. Insomnia: baseline but vivid dreams. May adjsust timing of lexapro and divide dose if needed. On ambien as well   Questions addressed follow-up in 2 months or call in early in case there is some concern about vivid dreams ongoing De Nurse, Verdella Laidlaw, MD 10/22/20181:47 PM

## 2017-12-03 ENCOUNTER — Ambulatory Visit (HOSPITAL_COMMUNITY): Payer: Self-pay | Admitting: Psychiatry

## 2017-12-08 ENCOUNTER — Other Ambulatory Visit (HOSPITAL_COMMUNITY): Payer: Self-pay | Admitting: Psychiatry

## 2017-12-10 ENCOUNTER — Ambulatory Visit (HOSPITAL_COMMUNITY): Payer: Self-pay | Admitting: Psychiatry

## 2017-12-12 NOTE — Telephone Encounter (Signed)
Pharmacy sent electronic request asking for a refill on Lexapro 20mg . Per Dr. De Nurse a one month supply was sent to the pharmacy. Patients' next office visit is on 01/21/18. Nothing further is needed at this time.

## 2017-12-14 DIAGNOSIS — F32A Depression, unspecified: Secondary | ICD-10-CM | POA: Insufficient documentation

## 2017-12-14 DIAGNOSIS — F429 Obsessive-compulsive disorder, unspecified: Secondary | ICD-10-CM

## 2017-12-14 HISTORY — DX: Obsessive-compulsive disorder, unspecified: F42.9

## 2017-12-19 ENCOUNTER — Other Ambulatory Visit: Payer: Self-pay | Admitting: Physician Assistant

## 2017-12-19 DIAGNOSIS — M25519 Pain in unspecified shoulder: Secondary | ICD-10-CM

## 2017-12-19 DIAGNOSIS — R1013 Epigastric pain: Secondary | ICD-10-CM

## 2017-12-19 DIAGNOSIS — M7500 Adhesive capsulitis of unspecified shoulder: Secondary | ICD-10-CM | POA: Insufficient documentation

## 2017-12-19 DIAGNOSIS — M5412 Radiculopathy, cervical region: Secondary | ICD-10-CM | POA: Insufficient documentation

## 2017-12-19 HISTORY — DX: Adhesive capsulitis of unspecified shoulder: M75.00

## 2017-12-19 HISTORY — DX: Pain in unspecified shoulder: M25.519

## 2017-12-19 HISTORY — DX: Radiculopathy, cervical region: M54.12

## 2017-12-20 ENCOUNTER — Ambulatory Visit (INDEPENDENT_AMBULATORY_CARE_PROVIDER_SITE_OTHER): Payer: BLUE CROSS/BLUE SHIELD

## 2017-12-20 DIAGNOSIS — R1013 Epigastric pain: Secondary | ICD-10-CM

## 2017-12-20 DIAGNOSIS — R110 Nausea: Secondary | ICD-10-CM

## 2018-01-21 ENCOUNTER — Other Ambulatory Visit: Payer: Self-pay

## 2018-01-21 ENCOUNTER — Encounter (HOSPITAL_COMMUNITY): Payer: Self-pay | Admitting: Psychiatry

## 2018-01-21 ENCOUNTER — Ambulatory Visit (INDEPENDENT_AMBULATORY_CARE_PROVIDER_SITE_OTHER): Payer: BLUE CROSS/BLUE SHIELD | Admitting: Psychiatry

## 2018-01-21 VITALS — BP 140/88 | HR 78 | Ht 67.0 in | Wt 190.0 lb

## 2018-01-21 DIAGNOSIS — Z87891 Personal history of nicotine dependence: Secondary | ICD-10-CM

## 2018-01-21 DIAGNOSIS — F339 Major depressive disorder, recurrent, unspecified: Secondary | ICD-10-CM | POA: Diagnosis not present

## 2018-01-21 DIAGNOSIS — F419 Anxiety disorder, unspecified: Secondary | ICD-10-CM | POA: Diagnosis not present

## 2018-01-21 DIAGNOSIS — F411 Generalized anxiety disorder: Secondary | ICD-10-CM | POA: Diagnosis not present

## 2018-01-21 DIAGNOSIS — Z818 Family history of other mental and behavioral disorders: Secondary | ICD-10-CM

## 2018-01-21 DIAGNOSIS — F5102 Adjustment insomnia: Secondary | ICD-10-CM | POA: Diagnosis not present

## 2018-01-21 DIAGNOSIS — Z811 Family history of alcohol abuse and dependence: Secondary | ICD-10-CM | POA: Diagnosis not present

## 2018-01-21 MED ORDER — ESCITALOPRAM OXALATE 20 MG PO TABS: ORAL_TABLET | ORAL | 2 refills | Status: DC

## 2018-01-21 NOTE — Progress Notes (Signed)
[facility] Outpatient Follow up visit   Patient Identification: Kayla Hood MRN:  [id number] Date of Evaluation:  01/21/2018 Referral Source: 52 years old currently married Caucasian female initially referred by primary care office for management of depression and anxiety  Chief Complaint:   Chief Complaint    Follow-up; Other     Visit Diagnosis:    ICD-10-CM   1. Major depression, recurrent, chronic (HCC) F33.9   2. GAD (generalized anxiety disorder) F41.1   3. Adjustment insomnia F51.02   4. Anxiety F41.9     History of Present Illness:   Patient is presented with anxiety and depression related to being more lonely since her son went to college last visit we increased the Lexapro to 15 mg that has helped.  Doing fair. lexapro at 20mg . Anxiety less. Adjusting to relocation and empty nest  husband supportive   [place]. Son in college Modifying factors; gym Severity of depression; better  Sleep difficult without ambien  Duration 4 years  Past Psychiatric History: depression, anxiety   Previous Psychotropic Medications: Yes  VIIbryd made her agitated.  zoloft didn't help later Substance Abuse History in the last 12 months:  No.  Consequences of Substance Abuse: NA  Past Medical History:  Past Medical History:  Diagnosis Date  . Allergy   . Chicken pox   . Depression   . Hypertension   . Incontinence of urine   . UTI (lower urinary tract infection)     Past Surgical History:  Procedure Laterality Date  . ABLATION    . CERVICAL FUSION  2012  . pelvic laproscopic surgeries     4 prior to 2008    Family Psychiatric History: Dad; alcohol use in past. Depression. MOm side has depression and some bipolar in family  Family History:  Family History  Problem Relation Age of Onset  . Hyperlipidemia Mother   . Ulcerative colitis Mother   . Heart disease Father 75       CAD  . Hypertension Father   . Diverticulitis  Father   . Hypertension Brother   . Cancer Maternal Grandfather 72       Stomach Cancer  . Cancer Paternal Grandmother 39       Uterine Cancer  . Stroke Paternal Grandfather 8       CVA  . Cancer Maternal Grandmother     Social History:   Social History   Socioeconomic History  . Marital status: Married    Spouse name: Cyla Haluska  . Number of children: 1  . Years of education: 69  . Highest education level: None  Social Needs  . Financial resource strain: None  . Food insecurity - worry: None  . Food insecurity - inability: None  . Transportation needs - medical: None  . Transportation needs - non-medical: None  Occupational History  . 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 Smoking status: Former Smoker    Packs/day: 0.25    Years: 5.00    Pack years: 1.25    Types: Cigarettes    Last attempt to quit: 03/21/2000    Years since quitting: 17.8  . Smokeless tobacco: Never Used  Substance and Sexual Activity  . Alcohol use: No    Alcohol/week: 1.2 oz    Types: 2 Cans of beer per week    Comment: Occasionally   . Drug use: No  . Sexual activity:  Yes    Birth control/protection: None  Other Topics Concern  . None  Social History Narrative  . None     Allergies:   Allergies  Allergen Reactions  . Promethazine Anaphylaxis and Other (See Comments)    Other Reaction: OTHER REACTION  . Phenergan [Promethazine Hcl] Rash    Metabolic Disorder Labs: No results found for: HGBA1C, MPG No results found for: PROLACTIN Lab Results  Component Value Date   CHOL 253 (H) 12/22/2015   TRIG 186 (H) 12/22/2015   HDL 55 12/22/2015   CHOLHDL 4.6 12/22/2015   VLDL 37 (H) 12/22/2015   LDLCALC 161 (H) 12/22/2015   LDLCALC 129 (H) 07/08/2015     Current Medications: Current Outpatient Medications  Medication Sig Dispense Refill  . escitalopram (LEXAPRO) 20 MG tablet Take 1 tablet (20 mg total) by mouth  daily. Can take half twice a day 30 tablet 1  . escitalopram (LEXAPRO) 20 MG tablet TAKE 1 TABLET(20 MG) BY MOUTH DAILY 30 tablet 2  . ibuprofen (ADVIL,MOTRIN) 200 MG tablet Take 600 mg by mouth every 6 (six) hours as needed for pain.    Marland Kitchen zolpidem (AMBIEN) 10 MG tablet Take 1 tablet (10 mg total) by mouth at bedtime. 30 tablet 5  . diclofenac sodium (VOLTAREN) 1 % GEL Apply 4 g topically 4 (four) times daily. (Patient not taking: Reported on 10/15/2017) 1 Tube 3  . valACYclovir (VALTREX) 1000 MG tablet Take 2 tablets twice a day for 1 day. (Patient not taking: Reported on 10/15/2017) 20 tablet 0   No current facility-administered medications for this visit.       Psychiatric Specialty Exam: Review of Systems  Cardiovascular: Negative for chest pain.  Skin: Negative for rash.  Neurological: Negative for tremors.  Psychiatric/Behavioral: Negative for depression.    Blood pressure 140/88, pulse 78, height 5\' 7"  (1.702 m), weight 190 lb (86.2 kg).Body mass index is 29.76 kg/m.  General Appearance: Neat  Eye Contact:  Fair  Speech:  Normal Rate  Volume:  Normal  Mood:  improved  Affect:  reactive  Thought Process:  Goal Directed  Orientation:  Full (Time, Place, and Person)  Thought Content:  Rumination  Suicidal Thoughts:  No  Homicidal Thoughts:  No  Memory:  Immediate;   Fair Recent;   Fair  Judgement:  Fair  Insight:  Fair  Psychomotor Activity:  Normal  Concentration:  Concentration: Fair and Attention Span: Fair  Recall:  [employer] of Knowledge:Fair  Language: Fair  Akathisia:  Negative  Handed:  Right  AIMS (if indicated):    Assets:  Desire for Improvement  ADL's:  Intact  Cognition: WNL  Sleep:  Fair with meds    Treatment Plan Summary: Medication management and Plan as follows  1. Major depression, recurrent , moderate: stable. Continue lexapro 2. GAD: not worse. Continue lexapr at this dose  3. Insomnia: not worse. Continue treatment  fu 3 months. Call  back for concerns   Merian Capron, MD 1/28/201910:19 AM

## 2018-02-24 DIAGNOSIS — R0789 Other chest pain: Secondary | ICD-10-CM | POA: Insufficient documentation

## 2018-02-24 HISTORY — DX: Other chest pain: R07.89

## 2018-02-25 ENCOUNTER — Ambulatory Visit: Payer: Self-pay | Admitting: Physician Assistant

## 2018-03-01 ENCOUNTER — Encounter: Payer: Self-pay | Admitting: Physician Assistant

## 2018-03-01 ENCOUNTER — Ambulatory Visit (INDEPENDENT_AMBULATORY_CARE_PROVIDER_SITE_OTHER): Payer: BLUE CROSS/BLUE SHIELD | Admitting: Physician Assistant

## 2018-03-01 VITALS — BP 162/89 | HR 91 | Ht 67.0 in | Wt 193.0 lb

## 2018-03-01 DIAGNOSIS — I10 Essential (primary) hypertension: Secondary | ICD-10-CM | POA: Diagnosis not present

## 2018-03-01 DIAGNOSIS — Z6830 Body mass index (BMI) 30.0-30.9, adult: Secondary | ICD-10-CM

## 2018-03-01 DIAGNOSIS — E782 Mixed hyperlipidemia: Secondary | ICD-10-CM

## 2018-03-01 DIAGNOSIS — E6609 Other obesity due to excess calories: Secondary | ICD-10-CM

## 2018-03-01 DIAGNOSIS — Z8249 Family history of ischemic heart disease and other diseases of the circulatory system: Secondary | ICD-10-CM | POA: Diagnosis not present

## 2018-03-01 NOTE — Progress Notes (Signed)
Subjective:    Patient ID: Kayla Hood, female    DOB: [birth year], 51 y.o.   MRN: [id number]  HPI  Pt is a 52 yo female with hx of HTN, hyperlipidemia. MDD, palpitations who presents to the clinic to follow up after ED visit for CP.   She was seen in ED on 02/24/18 for CP off and on and elevated BP. She had been on norvasc in the past but taken off. She had normal cardiac enzymes, lexiscan stress test was normal, normal CXR and EKG.  Cholesterol was T259, TG 242, LDL 162, HDL 49. She had normal CMP, TSH, CBC.  She is always more concerned because of her Dad's tripple bypass hx.   Pt's CP has resolved. Denies any acid reflux symptoms.   .. Active Ambulatory Problems    Diagnosis Date Noted  . Abnormal chest x-ray 03/03/2013  . Irregular heart beat 03/03/2013  . Sleep disturbance 03/03/2013  . General medical exam 03/03/2013  . Palpitations 03/21/2013  . Left hip pain 06/28/2013  . Tendonitis of shoulder 09/01/2013  . Hives 01/26/2014  . RLQ abdominal pain 01/26/2014  . Facial edema 02/04/2014  . Viral URI 03/09/2015  . Anxiety state 06/07/2015  . Depression with anxiety 07/08/2015  . Endometriosis 07/08/2015  . Fibroid 07/08/2015  . Exposure to blood or body fluid 11/24/2013  . 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 Hidradenitis suppurativa 12/22/2015  . Hot flashes due to surgical menopause 12/22/2015  . Hyperlipidemia 12/22/2015  . Essential hypertension, benign 12/22/2015  . Vaginal dryness, menopausal 12/22/2015  . Anxiety 12/22/2015  . Screening for diabetes mellitus 12/22/2015  . Nodule of finger of both hands 08/18/2016  . Abnormal weight gain 11/18/2016  . Diverticulitis of large intestine without perforation or abscess 01/23/2017  . Ear itching 03/12/2017  . Major depression, recurrent, chronic ([place]) 05/09/2017  . History of left tennis elbow 07/23/2017  . Numbness of left hand 09/03/2017  . Family history of early CAD 03/04/2018   Resolved Ambulatory Problems     Diagnosis Date Noted  . No Resolved Ambulatory Problems   Past Medical History:  Diagnosis Date  . Allergy   . Chicken pox   . Depression   . Hypertension   . Incontinence of urine   . UTI (lower urinary tract infection)       Review of Systems  All other systems reviewed and are negative.      Objective:   Physical Exam  Constitutional: She is oriented to person, place, and time. She appears well-developed and well-nourished.  HENT:  Head: Normocephalic and atraumatic.  Eyes: Conjunctivae are normal.  Neck: Normal range of motion. Neck supple.  Cardiovascular: Normal rate, regular rhythm and normal heart sounds.  Pulmonary/Chest: Effort normal and breath sounds normal. She has no wheezes.  Lymphadenopathy:    She has no cervical adenopathy.  Neurological: She is alert and oriented to person, place, and time.  Skin: Skin is dry.  Psychiatric: She has a normal mood and affect. Her behavior is normal.          Assessment & Plan:  Marland KitchenMarland KitchenKaelie was seen today for hospitalization follow-up and hypertension.  Diagnoses and all orders for this visit:  Essential hypertension, benign  Mixed hyperlipidemia  Family history of early CAD  Class 1 obesity due to excess calories without serious comorbidity with body mass index (BMI) of 30.0 to 30.9 in adult   Reassurance given about her cardiac health. Discussed cholesterol. Certainly with family hx and  numbers elevated I suggest going on a statin. Cholesterol calculation AHA was under 7.5 percent at 5.5 even with elevated BP. Pt was hesitant about going on a statin. Will will follow up in 4 weeks to talk more about this. I did encourage her to stat ASA 81mg  daily.   BP still not to goal. Increase norvasc to 10mg  and follow up in 4 weeks.   I discussed how weight loss can also help with this. Discussed medication options. She is going to get started on her own and then we can see from there.  Marland Kitchen.Discussed low carb diet with  1500 calories and 80g of protein.  Exercising at least 150 minutes a week.  [employer]itness Pal could be a Microbiologist.   Marland Kitchen.Spent 30 minutes with patient and greater than 50 percent of visit spent counseling patient regarding treatment plan.

## 2018-03-01 NOTE — Patient Instructions (Addendum)
Red yeast rice 1200mg  twice a day and fish oil 4000mg  daily.   Increase norvasc to 10mg  daily.     Managing Your Hypertension Hypertension is commonly called high blood pressure. This is when the force of your blood pressing against the walls of your arteries is too strong. Arteries are blood vessels that carry blood from your heart throughout your body. Hypertension forces the heart to work harder to pump blood, and may cause the arteries to become narrow or stiff. Having untreated or uncontrolled hypertension can cause heart attack, stroke, kidney disease, and other problems. What are blood pressure readings? A blood pressure reading consists of a higher number over a lower number. Ideally, your blood pressure should be below 120/80. The first ("top") number is called the systolic pressure. It is a measure of the pressure in your arteries as your heart beats. The second ("bottom") number is called the diastolic pressure. It is a measure of the pressure in your arteries as the heart relaxes. What does my blood pressure reading mean? Blood pressure is classified into four stages. Based on your blood pressure reading, your health care provider may use the following stages to determine what type of treatment you need, if any. Systolic pressure and diastolic pressure are measured in a unit called mm Hg. Normal  Systolic pressure: below 222.  Diastolic pressure: below 80. Elevated  Systolic pressure: 979-892.  Diastolic pressure: below 80. Hypertension stage 1  Systolic pressure: 119-417.  Diastolic pressure: 40-81. Hypertension stage 2  Systolic pressure: 448 or above.  Diastolic pressure: 90 or above. What health risks are associated with hypertension? Managing your hypertension is an important responsibility. Uncontrolled hypertension can lead to:  A heart attack.  A stroke.  A weakened blood vessel (aneurysm).  Heart failure.  Kidney damage.  Eye damage.  Metabolic  syndrome.  Memory and concentration problems.  What changes can I make to manage my hypertension? Hypertension can be managed by making lifestyle changes and possibly by taking medicines. Your health care provider will help you make a plan to bring your blood pressure within a normal range. Eating and drinking  Eat a diet that is high in fiber and potassium, and low in salt (sodium), added sugar, and fat. An example eating plan is called the DASH (Dietary Approaches to Stop Hypertension) diet. To eat this way: ? Eat plenty of fresh fruits and vegetables. Try to fill half of your plate at each meal with fruits and vegetables. ? Eat whole grains, such as whole wheat pasta, brown rice, or whole grain bread. Fill about one quarter of your plate with whole grains. ? Eat low-fat diary products. ? Avoid fatty cuts of meat, processed or cured meats, and poultry with skin. Fill about one quarter of your plate with lean proteins such as fish, chicken without skin, beans, eggs, and tofu. ? Avoid premade and processed foods. These tend to be higher in sodium, added sugar, and fat.  Reduce your daily sodium intake. Most people with hypertension should eat less than 1,500 mg of sodium a day.  Limit alcohol intake to no more than 1 drink a day for nonpregnant women and 2 drinks a day for men. One drink equals 12 oz of beer, 5 oz of wine, or 1 oz of hard liquor. Lifestyle  Work with your health care provider to maintain a healthy body weight, or to lose weight. Ask what an ideal weight is for you.  Get at least 30 minutes of exercise that  causes your heart to beat faster (aerobic exercise) most days of the week. Activities may include walking, swimming, or biking.  Include exercise to strengthen your muscles (resistance exercise), such as weight lifting, as part of your weekly exercise routine. Try to do these types of exercises for 30 minutes at least 3 days a week.  Do not use any products that contain  nicotine or tobacco, such as cigarettes and e-cigarettes. If you need help quitting, ask your health care provider.  Control any long-term (chronic) conditions you have, such as high cholesterol or diabetes. Monitoring  Monitor your blood pressure at home as told by your health care provider. Your personal target blood pressure may vary depending on your medical conditions, your age, and other factors.  Have your blood pressure checked regularly, as often as told by your health care provider. Working with your health care provider  Review all the medicines you take with your health care provider because there may be side effects or interactions.  Talk with your health care provider about your diet, exercise habits, and other lifestyle factors that may be contributing to hypertension.  Visit your health care provider regularly. Your health care provider can help you create and adjust your plan for managing hypertension. Will I need medicine to control my blood pressure? Your health care provider may prescribe medicine if lifestyle changes are not enough to get your blood pressure under control, and if:  Your systolic blood pressure is 130 or higher.  Your diastolic blood pressure is 80 or higher.  Take medicines only as told by your health care provider. Follow the directions carefully. Blood pressure medicines must be taken as prescribed. The medicine does not work as well when you skip doses. Skipping doses also puts you at risk for problems. Contact a health care provider if:  You think you are having a reaction to medicines you have taken.  You have repeated (recurrent) headaches.  You feel dizzy.  You have swelling in your ankles.  You have trouble with your vision. Get help right away if:  You develop a severe headache or confusion.  You have unusual weakness or numbness, or you feel faint.  You have severe pain in your chest or abdomen.  You vomit repeatedly.  You  have trouble breathing. Summary  Hypertension is when the force of blood pumping through your arteries is too strong. If this condition is not controlled, it may put you at risk for serious complications.  Your personal target blood pressure may vary depending on your medical conditions, your age, and other factors. For most people, a normal blood pressure is less than 120/80.  Hypertension is managed by lifestyle changes, medicines, or both. Lifestyle changes include weight loss, eating a healthy, low-sodium diet, exercising more, and limiting alcohol. This information is not intended to replace advice given to you by your health care provider. Make sure you discuss any questions you have with your health care provider. Document Released: 09/04/2012 Document Revised: 11/08/2016 Document Reviewed: 11/08/2016 Elsevier Interactive Patient Education  [employer].

## 2018-03-04 ENCOUNTER — Encounter: Payer: Self-pay | Admitting: Physician Assistant

## 2018-03-04 DIAGNOSIS — Z8249 Family history of ischemic heart disease and other diseases of the circulatory system: Secondary | ICD-10-CM

## 2018-03-04 HISTORY — DX: Family history of ischemic heart disease and other diseases of the circulatory system: Z82.49

## 2018-03-27 ENCOUNTER — Ambulatory Visit (INDEPENDENT_AMBULATORY_CARE_PROVIDER_SITE_OTHER): Payer: BLUE CROSS/BLUE SHIELD | Admitting: Physician Assistant

## 2018-03-27 ENCOUNTER — Encounter: Payer: Self-pay | Admitting: Physician Assistant

## 2018-03-27 VITALS — BP 141/82 | HR 90 | Ht 67.0 in | Wt 183.0 lb

## 2018-03-27 DIAGNOSIS — Z1231 Encounter for screening mammogram for malignant neoplasm of breast: Secondary | ICD-10-CM

## 2018-03-27 DIAGNOSIS — E663 Overweight: Secondary | ICD-10-CM | POA: Diagnosis not present

## 2018-03-27 DIAGNOSIS — I10 Essential (primary) hypertension: Secondary | ICD-10-CM | POA: Diagnosis not present

## 2018-03-27 DIAGNOSIS — F5101 Primary insomnia: Secondary | ICD-10-CM

## 2018-03-27 DIAGNOSIS — E782 Mixed hyperlipidemia: Secondary | ICD-10-CM | POA: Diagnosis not present

## 2018-03-27 HISTORY — DX: Overweight: E66.3

## 2018-03-27 HISTORY — DX: Primary insomnia: F51.01

## 2018-03-27 MED ORDER — AMLODIPINE BESYLATE 5 MG PO TABS: 5.0000 mg | ORAL_TABLET | Freq: Every day | ORAL | 1 refills | Status: DC

## 2018-03-27 MED ORDER — ZOLPIDEM TARTRATE 10 MG PO TABS: 10.0000 mg | ORAL_TABLET | Freq: Every day | ORAL | 5 refills | Status: DC

## 2018-03-27 NOTE — Progress Notes (Signed)
Subjective:    Patient ID: Kayla Hood, female    DOB: 1966-09-30, 52 y.o.   MRN: [id number]  HPI  Pt is a 52 yo female who presents to the clinic to follow up on HTN. She was started on norvase 10mg  about 4 weeks ago. She did not like how groggy she felt on 10mg  so she went back down to 5mg  and started working on weight loss. She has lost 10lbs. She is feeling much better. She denies any CP, palpitations, headaches, or vision changes. BP at home is 130's over 80's.   Needs refill on ambien. Splits in half. No problems with sleep.   On red yeast rice for cholesterol.   .. Active Ambulatory Problems    Diagnosis Date Noted  . Abnormal chest x-ray 03/03/2013  . Irregular heart beat 03/03/2013  . Sleep disturbance 03/03/2013  . General medical exam 03/03/2013  . Palpitations 03/21/2013  . Left hip pain 06/28/2013  . Tendonitis of shoulder 09/01/2013  . Hives 01/26/2014  . RLQ abdominal pain 01/26/2014  . Facial edema 02/04/2014  . Anxiety state 06/07/2015  . Depression with anxiety 07/08/2015  . Endometriosis 07/08/2015  . Fibroid 07/08/2015  . Exposure to blood or body fluid 11/24/2013  . 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 Hidradenitis suppurativa 12/22/2015  . Hot flashes due to surgical menopause 12/22/2015  . Hyperlipidemia 12/22/2015  . Essential hypertension, benign 12/22/2015  . Vaginal dryness, menopausal 12/22/2015  . Anxiety 12/22/2015  . Screening for diabetes mellitus 12/22/2015  . Nodule of finger of both hands 08/18/2016  . Abnormal weight gain 11/18/2016  . Diverticulitis of large intestine without perforation or abscess 01/23/2017  . Ear itching 03/12/2017  . Major depression, recurrent, chronic ([place]) 05/09/2017  . History of left tennis elbow 07/23/2017  . Numbness of left hand 09/03/2017  . Family history of early CAD 03/04/2018  . Primary insomnia 03/27/2018  . Overweight (BMI 25.0-29.9) 03/27/2018   Resolved Ambulatory Problems    Diagnosis Date  Noted  . Viral URI 03/09/2015   Past Medical History:  Diagnosis Date  . Allergy   . Chicken pox   . Depression   . Hypertension   . Incontinence of urine   . UTI (lower urinary tract infection)        Review of Systems  All other systems reviewed and are negative.      Objective:   Physical Exam  Constitutional: She is oriented to person, place, and time. She appears well-developed and well-nourished.  HENT:  Head: Normocephalic and atraumatic.  Cardiovascular: Normal rate, regular rhythm and normal heart sounds.  Pulmonary/Chest: Effort normal. She has no wheezes.  Neurological: She is alert and oriented to person, place, and time.  Skin: Skin is dry.  Psychiatric: She has a normal mood and affect. Her behavior is normal.          Assessment & Plan:  Marland KitchenMarland KitchenReighn was seen today for hypertension.  Diagnoses and all orders for this visit:  Essential hypertension, benign -     amLODipine (NORVASC) 5 MG tablet; Take 1 tablet (5 mg total) by mouth daily.  Primary insomnia -     zolpidem (AMBIEN) 10 MG tablet; Take 1 tablet (10 mg total) by mouth at bedtime.  Visit for screening mammogram -     MM SCREENING BREAST TOMO BILATERAL  Mixed hyperlipidemia  Overweight (BMI 25.0-29.9)   Too soon to recheck lipid level. Follow up in 3 months.  BP looks good today. Continue  on 5mg  daily of norvasc.  Continue to work on diet and weight loss.  ambien refilled.

## 2018-04-11 ENCOUNTER — Ambulatory Visit: Payer: BLUE CROSS/BLUE SHIELD | Admitting: Sports Medicine

## 2018-04-15 ENCOUNTER — Ambulatory Visit (HOSPITAL_COMMUNITY): Payer: Self-pay | Admitting: Psychiatry

## 2018-04-23 ENCOUNTER — Ambulatory Visit: Payer: BLUE CROSS/BLUE SHIELD | Admitting: Sports Medicine

## 2018-04-23 ENCOUNTER — Ambulatory Visit (INDEPENDENT_AMBULATORY_CARE_PROVIDER_SITE_OTHER): Payer: BLUE CROSS/BLUE SHIELD | Admitting: Psychiatry

## 2018-04-23 ENCOUNTER — Encounter (HOSPITAL_COMMUNITY): Payer: Self-pay | Admitting: Psychiatry

## 2018-04-23 ENCOUNTER — Other Ambulatory Visit: Payer: Self-pay

## 2018-04-23 VITALS — BP 140/90 | HR 69 | Ht 67.0 in | Wt 182.0 lb

## 2018-04-23 DIAGNOSIS — F339 Major depressive disorder, recurrent, unspecified: Secondary | ICD-10-CM

## 2018-04-23 DIAGNOSIS — Z818 Family history of other mental and behavioral disorders: Secondary | ICD-10-CM

## 2018-04-23 DIAGNOSIS — Z87891 Personal history of nicotine dependence: Secondary | ICD-10-CM

## 2018-04-23 DIAGNOSIS — Z811 Family history of alcohol abuse and dependence: Secondary | ICD-10-CM

## 2018-04-23 DIAGNOSIS — F411 Generalized anxiety disorder: Secondary | ICD-10-CM

## 2018-04-23 DIAGNOSIS — F5102 Adjustment insomnia: Secondary | ICD-10-CM

## 2018-04-23 MED ORDER — ESCITALOPRAM OXALATE 20 MG PO TABS: ORAL_TABLET | ORAL | 2 refills | Status: DC

## 2018-04-23 NOTE — Progress Notes (Signed)
[facility] Outpatient Follow up visit   Patient Identification: Kayla Hood MRN:  [id number] Date of Evaluation:  04/23/2018 Referral Source: 52 years old currently married Caucasian female initially referred by primary care office for management of depression and anxiety  Chief Complaint:   Chief Complaint    Follow-up; Other     Visit Diagnosis:    ICD-10-CM   1. Major depression, recurrent, chronic (HCC) F33.9   2. GAD (generalized anxiety disorder) F41.1   3. 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  Doing fair on lexapro 20mg .  Adjusting to relocation and empty nest  husband supportive  Concern of her husband diagnosed with prostate cancer. Worries are related to that  Aggravating factors;country living. Relationship. Son in college Modifying factors; gym Severity of depression; not worse Sleep difficult without ambien  Duration 4 years  Past Psychiatric History: depression, anxiety   Previous Psychotropic Medications: Yes  VIIbryd made her agitated.  zoloft didn't help later Substance Abuse History in the last 12 months:  No.  Consequences of Substance Abuse: NA  Past Medical History:  Past Medical History:  Diagnosis Date  . Allergy   . Chicken pox   . Depression   . Hypertension   . Incontinence of urine   . UTI (lower urinary tract infection)     Past Surgical History:  Procedure Laterality Date  . ABLATION    . CERVICAL FUSION  2012  . pelvic laproscopic surgeries     4 prior to 2008    Family Psychiatric History: Dad; alcohol use in past. Depression. MOm side has depression and some bipolar in family  Family History:  Family History  Problem Relation Age of Onset  . Hyperlipidemia Mother   . Ulcerative colitis Mother   . Heart disease Father 24       CAD  . Hypertension Father   . Diverticulitis Father   .  Hypertension Brother   . Cancer Maternal Grandfather 10       Stomach Cancer  . Cancer Paternal Grandmother 47       Uterine Cancer  . Stroke Paternal Grandfather 30       CVA  . Cancer Maternal Grandmother     Social History:   Social History   Socioeconomic History  . Marital status: Married    Spouse name: Cheyla Duchemin  . Number of children: 1  . Years of education: 89  . Highest education level: Not on file  Occupational History  . 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 Financial resource strain: Not on file  . Food insecurity:    Worry: Not on file    Inability: Not on file  . Transportation needs:    Medical: Not on file    Non-medical: Not on file  Tobacco Use  . Smoking status: Former Smoker    Packs/day: 0.25    Years: 5.00    Pack years: 1.25    Types: Cigarettes    Last attempt to quit: 03/21/2000    Years since quitting: 18.1  . Smokeless tobacco: Never Used  Substance and Sexual Activity  . Alcohol use: No    Alcohol/week: 1.2 oz    Types: 2 Cans of beer per week    Comment: Occasionally   . Drug  use: No  . Sexual activity: Yes    Birth control/protection: None  Lifestyle  . Physical activity:    Days per week: Not on file    Minutes per session: Not on file  . Stress: Not on file  Relationships  . Social connections:    Talks on phone: Not on file    Gets together: Not on file    Attends religious service: Not on file    Active member of club or organization: Not on file    Attends meetings of clubs or organizations: Not on file    Relationship status: Not on file  Other Topics Concern  . Not on file  Social History Narrative  . Not on file     Allergies:   Allergies  Allergen Reactions  . Promethazine Anaphylaxis and Other (See Comments)    Other Reaction: OTHER REACTION  . Phenergan [Promethazine Hcl] Rash    Metabolic Disorder Labs: No results found for:  HGBA1C, MPG No results found for: PROLACTIN Lab Results  Component Value Date   CHOL 253 (H) 12/22/2015   TRIG 186 (H) 12/22/2015   HDL 55 12/22/2015   CHOLHDL 4.6 12/22/2015   VLDL 37 (H) 12/22/2015   LDLCALC 161 (H) 12/22/2015   LDLCALC 129 (H) 07/08/2015     Current Medications: Current Outpatient Medications  Medication Sig Dispense Refill  . amLODipine (NORVASC) 5 MG tablet Take 1 tablet (5 mg total) by mouth daily. 90 tablet 1  . escitalopram (LEXAPRO) 20 MG tablet TAKE 1 TABLET(20 MG) BY MOUTH DAILY 30 tablet 2  . ibuprofen (ADVIL,MOTRIN) 200 MG tablet Take 600 mg by mouth every 6 (six) hours as needed for pain.    . Red Yeast Rice 600 MG CAPS Take by mouth.    . zolpidem (AMBIEN) 10 MG tablet Take 1 tablet (10 mg total) by mouth at bedtime. 30 tablet 5   No current facility-administered medications for this visit.       Psychiatric Specialty Exam: Review of Systems  Cardiovascular: Negative for chest pain and palpitations.  Skin: Negative for rash.  Neurological: Negative for tremors.  Psychiatric/Behavioral: Negative for depression.    Blood pressure 140/90, pulse 69, height 5\' 7"  (1.702 m), weight 182 lb (82.6 kg).Body mass index is 28.51 kg/m.  General Appearance: Neat  Eye Contact:  Fair  Speech:  Normal Rate  Volume:  Normal  Mood: fair  Affect:  reactive  Thought Process:  Goal Directed  Orientation:  Full (Time, Place, and Person)  Thought Content:  Rumination  Suicidal Thoughts:  No  Homicidal Thoughts:  No  Memory:  Immediate;   Fair Recent;   Fair  Judgement:  Fair  Insight:  Fair  Psychomotor Activity:  Normal  Concentration:  Concentration: Fair and Attention Span: Fair  Recall:  [employer] of Knowledge:Fair  Language: Fair  Akathisia:  Negative  Handed:  Right  AIMS (if indicated):    Assets:  Desire for Improvement  ADL's:  Intact  Cognition: WNL  Sleep:  Fair with meds    Treatment Plan Summary: Medication management and Plan  as follows  1. Major depression, recurrent , moderate: remains fair. Continue lexapro 2. GAD: fair on lexapro Husband diagnosis upset her. Provided supportive therapy  3. Insomnia: not worse. Continue treatment  fu 3 months. Call back for concerns   Merian Capron, MD 4/30/20199:37 AM

## 2018-05-01 ENCOUNTER — Ambulatory Visit (INDEPENDENT_AMBULATORY_CARE_PROVIDER_SITE_OTHER): Payer: BLUE CROSS/BLUE SHIELD

## 2018-05-01 DIAGNOSIS — Z1231 Encounter for screening mammogram for malignant neoplasm of breast: Secondary | ICD-10-CM | POA: Diagnosis not present

## 2018-05-01 NOTE — Progress Notes (Signed)
Call pt: normal mammogram f/u in one year.

## 2018-05-06 ENCOUNTER — Encounter: Payer: Self-pay | Admitting: Physician Assistant

## 2018-05-08 ENCOUNTER — Other Ambulatory Visit (HOSPITAL_COMMUNITY): Payer: Self-pay

## 2018-05-08 ENCOUNTER — Other Ambulatory Visit (HOSPITAL_COMMUNITY): Payer: Self-pay | Admitting: Psychiatry

## 2018-05-08 MED ORDER — ESCITALOPRAM OXALATE 20 MG PO TABS: ORAL_TABLET | ORAL | 0 refills | Status: DC

## 2018-05-16 ENCOUNTER — Ambulatory Visit (INDEPENDENT_AMBULATORY_CARE_PROVIDER_SITE_OTHER): Payer: BLUE CROSS/BLUE SHIELD

## 2018-05-16 ENCOUNTER — Ambulatory Visit (INDEPENDENT_AMBULATORY_CARE_PROVIDER_SITE_OTHER): Payer: BLUE CROSS/BLUE SHIELD | Admitting: Sports Medicine

## 2018-05-16 ENCOUNTER — Encounter: Payer: Self-pay | Admitting: Sports Medicine

## 2018-05-16 DIAGNOSIS — M5136 Other intervertebral disc degeneration, lumbar region: Secondary | ICD-10-CM | POA: Insufficient documentation

## 2018-05-16 DIAGNOSIS — M51369 Other intervertebral disc degeneration, lumbar region without mention of lumbar back pain or lower extremity pain: Secondary | ICD-10-CM | POA: Insufficient documentation

## 2018-05-16 DIAGNOSIS — M5416 Radiculopathy, lumbar region: Secondary | ICD-10-CM

## 2018-05-16 HISTORY — DX: Other intervertebral disc degeneration, lumbar region without mention of lumbar back pain or lower extremity pain: M51.369

## 2018-05-16 HISTORY — DX: Other intervertebral disc degeneration, lumbar region: M51.36

## 2018-05-16 MED ORDER — KETOROLAC TROMETHAMINE 30 MG/ML IJ SOLN
30.0000 mg | Freq: Once | INTRAMUSCULAR | Status: AC
  Administered 2018-05-16: 30 mg via INTRAMUSCULAR

## 2018-05-16 MED ORDER — TRIAMCINOLONE ACETONIDE 40 MG/ML IJ SUSP
40.0000 mg | Freq: Once | INTRAMUSCULAR | Status: AC
  Administered 2018-05-16: 40 mg via INTRAMUSCULAR

## 2018-05-16 MED ORDER — PREDNISONE 50 MG PO TABS: ORAL_TABLET | ORAL | 0 refills | Status: DC

## 2018-05-16 NOTE — Addendum Note (Signed)
Addended by: Elizabeth Sauer on: 05/16/2018 03:06 PM   Modules accepted: Orders

## 2018-05-16 NOTE — Progress Notes (Signed)
Subjective:    I'm seeing this patient as a consultation for: Kayla Planas, PA-C  CC: Low back pain  HPI: On and off for many months this pleasant 52 year old female has had pain that radiates from her back, axial, worse with sitting, flexion, Valsalva with radiation down the back and side of the right leg, to the outer leg, but not to the foot.  No bowel or bladder dysfunction, saddle numbness, no constitutional symptoms.  Symptoms are moderate, persistent.  I reviewed the past medical history, family history, social history, surgical history, and allergies today and no changes were needed.  Please see the problem list section below in epic for further details.  Past Medical History: Past Medical History:  Diagnosis Date  . Allergy   . Chicken pox   . Depression   . Hypertension   . Incontinence of urine   . UTI (lower urinary tract infection)    Past Surgical History: Past Surgical History:  Procedure Laterality Date  . ABLATION    . CERVICAL FUSION  2012  . pelvic laproscopic surgeries     4 prior to 2008   Social History: Social History   Socioeconomic History  . Marital status: Married    Spouse name: Severa Jeremiah  . Number of children: 1  . Years of education: 42  . Highest education level: Not on file  Occupational History  . 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 Financial resource strain: Not on file  . Food insecurity:    Worry: Not on file    Inability: Not on file  . Transportation needs:    Medical: Not on file    Non-medical: Not on file  Tobacco Use  . Smoking status: Former Smoker    Packs/day: 0.25    Years: 5.00    Pack years: 1.25    Types: Cigarettes    Last attempt to quit: 03/21/2000    Years since quitting: 18.1  . Smokeless tobacco: Never Used  Substance and Sexual Activity  . Alcohol use: No    Alcohol/week: 1.2 oz    Types: 2 Cans of beer per week    Comment:  Occasionally   . Drug use: No  . Sexual activity: Yes    Birth control/protection: None  Lifestyle  . Physical activity:    Days per week: Not on file    Minutes per session: Not on file  . Stress: Not on file  Relationships  . Social connections:    Talks on phone: Not on file    Gets together: Not on file    Attends religious service: Not on file    Active member of club or organization: Not on file    Attends meetings of clubs or organizations: Not on file    Relationship status: Not on file  Other Topics Concern  . Not on file  Social History Narrative  . Not on file   Family History: Family History  Problem Relation Age of Onset  . Hyperlipidemia Mother   . Ulcerative colitis Mother   . Heart disease Father 7       CAD  . Hypertension Father   . Diverticulitis Father   . Hypertension Brother   . Cancer Maternal Grandfather 18       Stomach Cancer  . Cancer Paternal Grandmother 32       Uterine Cancer  . Stroke Paternal Grandfather 24  CVA  . Cancer Maternal Grandmother    Allergies: Allergies  Allergen Reactions  . Promethazine Anaphylaxis and Other (See Comments)    Other Reaction: OTHER REACTION  . Phenergan [Promethazine Hcl] Rash   Medications: See med rec.  Review of Systems: No headache, visual changes, nausea, vomiting, diarrhea, constipation, dizziness, abdominal pain, skin rash, fevers, chills, night sweats, weight loss, swollen lymph nodes, body aches, joint swelling, muscle aches, chest pain, shortness of breath, mood changes, visual or auditory hallucinations.   Objective:   General: Well Developed, well nourished, and in no acute distress.  Neuro:  Extra-ocular muscles intact, able to move all 4 extremities, sensation grossly intact.  Deep tendon reflexes tested were normal. Psych: Alert and oriented, mood congruent with affect. ENT:  Ears and nose appear unremarkable.  Hearing grossly normal. Neck: Unremarkable overall appearance,  trachea midline.  No visible thyroid enlargement. Eyes: Conjunctivae and lids appear unremarkable.  Pupils equal and round. Skin: Warm and dry, no rashes noted.  Cardiovascular: Pulses palpable, no extremity edema. Back Exam:  Inspection: Unremarkable  Motion: Flexion 45 deg, Extension 45 deg, Side Bending to 45 deg bilaterally,  Rotation to 45 deg bilaterally  SLR laying: Negative  XSLR laying: Negative  Palpable tenderness: None. FABER: negative. Sensory change: Gross sensation intact to all lumbar and sacral dermatomes.  Reflexes: 2+ at both patellar tendons, 2+ at achilles tendons, Babinski's downgoing.  Strength at foot  Plantar-flexion: 5/5 Dorsi-flexion: 5/5 Eversion: 5/5 Inversion: 5/5  Leg strength  Quad: 5/5 Hamstring: 5/5 Hip flexor: 5/5 Hip abductors: 5/5  Gait unremarkable.  CT from 2018 personally reviewed, degenerative disc disease with bulging disks at L4-S1.  Impression and Recommendations:   This case required medical decision making of moderate complexity.  Right lumbar radiculitis With axial discogenic back pain, right-sided radicular symptoms. No signs of cauda equina syndrome. MRI from 2013 did show L4-S1 protruding discs, CT scan from 2018 showed the same. We are going to start conservative, kenalog 40, Toradol intramuscular. 5 days of prednisone, ibuprofen 800 mg 3 times per day when she finishes the prednisone. Formal physical therapy, return to see me in 4 weeks, MRI for interventional planning if no better. ___________________________________________ Gwen Her. [name], M.D., ABFM., CAQSM. Primary Care and [place] Instructor of [place] [employer] [facility] [employer]

## 2018-05-16 NOTE — Assessment & Plan Note (Signed)
With axial discogenic back pain, right-sided radicular symptoms. No signs of cauda equina syndrome. MRI from 2013 did show L4-S1 protruding discs, CT scan from 2018 showed the same. We are going to start conservative, kenalog 40, Toradol intramuscular. 5 days of prednisone, ibuprofen 800 mg 3 times per day when she finishes the prednisone. Formal physical therapy, return to see me in 4 weeks, MRI for interventional planning if no better.

## 2018-05-16 NOTE — Patient Instructions (Signed)
Lumbosacral Radiculopathy Lumbosacral radiculopathy is a condition that involves the spinal nerves and nerve roots in the low back and bottom of the spine. The condition develops when these nerves and nerve roots move out of place or become inflamed and cause symptoms. What are the causes? This condition may be caused by:  Pressure from a disk that bulges out of place (herniated disk). A disk is a plate of cartilage that separates bones in the spine.  Disk degeneration.  A narrowing of the bones of the lower back (spinal stenosis).  A tumor.  An infection.  An injury that places sudden pressure on the disks that cushion the bones of your lower spine.  What increases the risk? This condition is more likely to develop in:  Males aged 30-50 years.  Females aged 50-60 years.  People who lift improperly.  People who are overweight or live a sedentary lifestyle.  People who smoke.  People who perform repetitive activities that strain the spine.  What are the signs or symptoms? Symptoms of this condition include:  Pain that goes down from the back into the legs (sciatica). This is the most common symptom. The pain may be worse with sitting, coughing, or sneezing.  Pain and numbness in the arms and legs.  Muscle weakness.  Tingling.  Loss of bladder control or bowel control.  How is this diagnosed? This condition is diagnosed with a physical exam and medical history. If the pain is lasting, you may have tests, such as:  MRI scan.  X-ray.  CT scan.  Myelogram.  Nerve conduction study.  How is this treated? This condition is often treated with:  Hot packs and ice applied to affected areas.  Stretches to improve flexibility.  Exercises to strengthen back muscles.  Physical therapy.  Pain medicine.  A steroid injection in the spine.  In some cases, no treatment is needed. If the condition is long-lasting (chronic), or if symptoms are severe, treatment may  involve surgery or lifestyle changes, such as following a weight loss plan. Follow these instructions at home: Medicines  Take medicines only as directed by your health care provider.  Do not drive or operate heavy machinery while taking pain medicine. Injury care  Apply a heat pack to the injured area as directed by your health care provider.  Apply ice to the affected area: ? Put ice in a plastic bag. ? Place a towel between your skin and the bag. ? Leave the ice on for 20-30 minutes, every 2 hours while you are awake or as needed. Or, leave the ice on for as long as directed by your health care provider. Other Instructions  If you were shown how to do any exercises or stretches, do them as directed by your health care provider.  If your health care provider prescribed a diet or exercise program, follow it as directed.  Keep all follow-up visits as directed by your health care provider. This is important. Contact a health care provider if:  Your pain does not improve over time even when taking pain medicines. Get help right away if:  Your develop severe pain.  Your pain suddenly gets worse.  You develop increasing weakness in your legs.  You lose the ability to control your bladder or bowel.  You have difficulty walking or balancing.  You have a fever. This information is not intended to replace advice given to you by your health care provider. Make sure you discuss any questions you have with your   health care provider. Document Released: 12/11/2005 Document Revised: 05/18/2016 Document Reviewed: 12/07/2014 Elsevier Interactive Patient Education  2018 Elsevier Inc.  

## 2018-06-17 ENCOUNTER — Ambulatory Visit (INDEPENDENT_AMBULATORY_CARE_PROVIDER_SITE_OTHER): Payer: BLUE CROSS/BLUE SHIELD

## 2018-06-17 ENCOUNTER — Encounter: Payer: Self-pay | Admitting: Sports Medicine

## 2018-06-17 ENCOUNTER — Ambulatory Visit (INDEPENDENT_AMBULATORY_CARE_PROVIDER_SITE_OTHER): Payer: BLUE CROSS/BLUE SHIELD | Admitting: Sports Medicine

## 2018-06-17 DIAGNOSIS — I10 Essential (primary) hypertension: Secondary | ICD-10-CM

## 2018-06-17 DIAGNOSIS — M5136 Other intervertebral disc degeneration, lumbar region: Secondary | ICD-10-CM | POA: Diagnosis not present

## 2018-06-17 DIAGNOSIS — M5126 Other intervertebral disc displacement, lumbar region: Secondary | ICD-10-CM

## 2018-06-17 DIAGNOSIS — M5416 Radiculopathy, lumbar region: Secondary | ICD-10-CM | POA: Diagnosis not present

## 2018-06-17 MED ORDER — AMLODIPINE BESYLATE 10 MG PO TABS: 10.0000 mg | ORAL_TABLET | Freq: Every day | ORAL | 3 refills | Status: DC

## 2018-06-17 MED ORDER — CYCLOBENZAPRINE HCL 10 MG PO TABS: ORAL_TABLET | ORAL | 0 refills | Status: DC

## 2018-06-17 MED ORDER — DIAZEPAM 5 MG PO TABS: ORAL_TABLET | ORAL | 0 refills | Status: DC

## 2018-06-17 NOTE — Assessment & Plan Note (Signed)
Persistently elevated blood pressure, increasing amlodipine to 10 mg daily.

## 2018-06-17 NOTE — Assessment & Plan Note (Signed)
Axial discogenic back pain, right-sided radicular symptoms. X-rays were unrevealing. We are going to proceed with an epidural, she has failed 6 weeks of formal physical therapy. MRI at 330 today, return for interventional planning.

## 2018-06-17 NOTE — Progress Notes (Signed)
Subjective:    CC: Follow-up  HPI: Kayla Hood returns, unfortunately she continues to have pain in her back with radiation down the right leg.  She has done greater than 6 weeks of physician directed conservative measures, steroids, NSAIDs.  Blood pressure is elevated, no headaches, visual changes, chest pain.  I reviewed the past medical history, family history, social history, surgical history, and allergies today and no changes were needed.  Please see the problem list section below in epic for further details.  Past Medical History: Past Medical History:  Diagnosis Date  . Allergy   . Chicken pox   . Depression   . Hypertension   . Incontinence of urine   . UTI (lower urinary tract infection)    Past Surgical History: Past Surgical History:  Procedure Laterality Date  . ABLATION    . CERVICAL FUSION  2012  . pelvic laproscopic surgeries     4 prior to 2008   Social History: Social History   Socioeconomic History  . Marital status: Married    Spouse name: Lark Langenfeld  . Number of children: 1  . Years of education: 90  . Highest education level: Not on file  Occupational History  . 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 Financial resource strain: Not on file  . Food insecurity:    Worry: Not on file    Inability: Not on file  . Transportation needs:    Medical: Not on file    Non-medical: Not on file  Tobacco Use  . Smoking status: Former Smoker    Packs/day: 0.25    Years: 5.00    Pack years: 1.25    Types: Cigarettes    Last attempt to quit: 03/21/2000    Years since quitting: 18.2  . Smokeless tobacco: Never Used  Substance and Sexual Activity  . Alcohol use: No    Alcohol/week: 1.2 oz    Types: 2 Cans of beer per week    Comment: Occasionally   . Drug use: No  . Sexual activity: Yes    Birth control/protection: None  Lifestyle  . Physical activity:    Days per week: Not on file      Minutes per session: Not on file  . Stress: Not on file  Relationships  . Social connections:    Talks on phone: Not on file    Gets together: Not on file    Attends religious service: Not on file    Active member of club or organization: Not on file    Attends meetings of clubs or organizations: Not on file    Relationship status: Not on file  Other Topics Concern  . Not on file  Social History Narrative  . Not on file   Family History: Family History  Problem Relation Age of Onset  . Hyperlipidemia Mother   . Ulcerative colitis Mother   . Heart disease Father 65       CAD  . Hypertension Father   . Diverticulitis Father   . Hypertension Brother   . Cancer Maternal Grandfather 65       Stomach Cancer  . Cancer Paternal Grandmother 51       Uterine Cancer  . Stroke Paternal Grandfather 38       CVA  . Cancer Maternal Grandmother    Allergies: Allergies  Allergen Reactions  . Promethazine Anaphylaxis and Other (See Comments)  Other Reaction: OTHER REACTION  . Phenergan [Promethazine Hcl] Rash   Medications: See med rec.  Review of Systems: No fevers, chills, night sweats, weight loss, chest pain, or shortness of breath.   Objective:    General: Well Developed, well nourished, and in no acute distress.  Neuro: Alert and oriented x3, extra-ocular muscles intact, sensation grossly intact.  HEENT: Normocephalic, atraumatic, pupils equal round reactive to light, neck supple, no masses, no lymphadenopathy, thyroid nonpalpable.  Skin: Warm and dry, no rashes. Cardiac: Regular rate and rhythm, no murmurs rubs or gallops, no lower extremity edema.  Respiratory: Clear to auscultation bilaterally. Not using accessory muscles, speaking in full sentences.  Impression and Recommendations:    Right lumbar radiculitis Axial discogenic back pain, right-sided radicular symptoms. X-rays were unrevealing. We are going to proceed with an epidural, she has failed 6 weeks of  formal physical therapy. MRI at 330 today, return for interventional planning.  Essential hypertension, benign Persistently elevated blood pressure, increasing amlodipine to 10 mg daily.  I spent 25 minutes with this patient, greater than 50% was face-to-face time counseling regarding the above diagnoses ___________________________________________ Gwen Her. [name], M.D., ABFM., CAQSM. Primary Care and [place] Instructor of [place] [employer] [facility] of Medicine

## 2018-06-19 ENCOUNTER — Ambulatory Visit (INDEPENDENT_AMBULATORY_CARE_PROVIDER_SITE_OTHER): Payer: BLUE CROSS/BLUE SHIELD | Admitting: Sports Medicine

## 2018-06-19 DIAGNOSIS — M5416 Radiculopathy, lumbar region: Secondary | ICD-10-CM | POA: Diagnosis not present

## 2018-06-19 NOTE — Assessment & Plan Note (Signed)
With axial discogenic pain and right-sided radicular symptoms in L5 distribution. MRI shows impingement of the right extraforaminal L5 nerve root, proceeding with a right L5-S1 transforaminal epidural. Return to see me 1 month after injection to evaluate relief.

## 2018-06-19 NOTE — Progress Notes (Signed)
Subjective:    CC: MRI results  HPI: Kayla Hood has improved considerably with physical therapy, medications, she still has occasional right-sided L5 radicular pain, we are discussing MRI results today.  I reviewed the past medical history, family history, social history, surgical history, and allergies today and no changes were needed.  Please see the problem list section below in epic for further details.  Past Medical History: Past Medical History:  Diagnosis Date  . Allergy   . Chicken pox   . Depression   . Hypertension   . Incontinence of urine   . UTI (lower urinary tract infection)    Past Surgical History: Past Surgical History:  Procedure Laterality Date  . ABLATION    . CERVICAL FUSION  2012  . pelvic laproscopic surgeries     4 prior to 2008   Social History: Social History   Socioeconomic History  . Marital status: Married    Spouse name: Bedie Dominey  . Number of children: 1  . Years of education: 60  . Highest education level: Not on file  Occupational History  . 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 Financial resource strain: Not on file  . Food insecurity:    Worry: Not on file    Inability: Not on file  . Transportation needs:    Medical: Not on file    Non-medical: Not on file  Tobacco Use  . Smoking status: Former Smoker    Packs/day: 0.25    Years: 5.00    Pack years: 1.25    Types: Cigarettes    Last attempt to quit: 03/21/2000    Years since quitting: 18.2  . Smokeless tobacco: Never Used  Substance and Sexual Activity  . Alcohol use: No    Alcohol/week: 1.2 oz    Types: 2 Cans of beer per week    Comment: Occasionally   . Drug use: No  . Sexual activity: Yes    Birth control/protection: None  Lifestyle  . Physical activity:    Days per week: Not on file    Minutes per session: Not on file  . Stress: Not on file  Relationships  . Social connections:    Talks  on phone: Not on file    Gets together: Not on file    Attends religious service: Not on file    Active member of club or organization: Not on file    Attends meetings of clubs or organizations: Not on file    Relationship status: Not on file  Other Topics Concern  . Not on file  Social History Narrative  . Not on file   Family History: Family History  Problem Relation Age of Onset  . Hyperlipidemia Mother   . Ulcerative colitis Mother   . Heart disease Father 37       CAD  . Hypertension Father   . Diverticulitis Father   . Hypertension Brother   . Cancer Maternal Grandfather 51       Stomach Cancer  . Cancer Paternal Grandmother 59       Uterine Cancer  . Stroke Paternal Grandfather 51       CVA  . Cancer Maternal Grandmother    Allergies: Allergies  Allergen Reactions  . Promethazine Anaphylaxis and Other (See Comments)    Other Reaction: OTHER REACTION  . Phenergan [Promethazine Hcl] Rash   Medications: See med rec.  Review of Systems: No  fevers, chills, night sweats, weight loss, chest pain, or shortness of breath.   Objective:    General: Well Developed, well nourished, and in no acute distress.  Neuro: Alert and oriented x3, extra-ocular muscles intact, sensation grossly intact.  HEENT: Normocephalic, atraumatic, pupils equal round reactive to light, neck supple, no masses, no lymphadenopathy, thyroid nonpalpable.  Skin: Warm and dry, no rashes. Cardiac: Regular rate and rhythm, no murmurs rubs or gallops, no lower extremity edema.  Respiratory: Clear to auscultation bilaterally. Not using accessory muscles, speaking in full sentences.  Lumbar spine MRI personally reviewed, she has multilevel disc protrusions but worse at the L5-S1 level with an extraforaminal right L5-S1 disc extrusion, soft, with contact to the right extraforaminal L5 nerve root  Impression and Recommendations:    Right lumbar radiculitis With axial discogenic pain and right-sided  radicular symptoms in L5 distribution. MRI shows impingement of the right extraforaminal L5 nerve root, proceeding with a right L5-S1 transforaminal epidural. Return to see me 1 month after injection to evaluate relief.  I spent 25 minutes with this patient, greater than 50% was face-to-face time counseling regarding the above diagnoses ___________________________________________ Gwen Her. [name], M.D., ABFM., CAQSM. Primary Care and [place] Instructor of [place] [facility] of Medicine

## 2018-07-01 ENCOUNTER — Ambulatory Visit
Admission: RE | Admit: 2018-07-01 | Discharge: 2018-07-01 | Disposition: A | Discharge status: Home / Self Care | Source: Ambulatory Visit | Attending: Sports Medicine | Admitting: Sports Medicine

## 2018-07-01 MED ORDER — IOPAMIDOL (ISOVUE-M 200) INJECTION 41%
1.0000 mL | Freq: Once | INTRAMUSCULAR | Status: AC
  Administered 2018-07-01: 1 mL via EPIDURAL

## 2018-07-01 MED ORDER — METHYLPREDNISOLONE ACETATE 40 MG/ML INJ SUSP (RADIOLOG
120.0000 mg | Freq: Once | INTRAMUSCULAR | Status: AC
  Administered 2018-07-01: 120 mg via EPIDURAL

## 2018-07-01 NOTE — Discharge Instructions (Signed)

## 2018-07-22 ENCOUNTER — Other Ambulatory Visit: Payer: Self-pay

## 2018-07-22 ENCOUNTER — Encounter (HOSPITAL_COMMUNITY): Payer: Self-pay | Admitting: Psychiatry

## 2018-07-22 ENCOUNTER — Ambulatory Visit (INDEPENDENT_AMBULATORY_CARE_PROVIDER_SITE_OTHER): Admitting: Psychiatry

## 2018-07-22 VITALS — BP 122/90 | HR 75 | Ht 67.0 in | Wt 186.0 lb

## 2018-07-22 DIAGNOSIS — F5102 Adjustment insomnia: Secondary | ICD-10-CM

## 2018-07-22 DIAGNOSIS — F339 Major depressive disorder, recurrent, unspecified: Secondary | ICD-10-CM

## 2018-07-22 DIAGNOSIS — F411 Generalized anxiety disorder: Secondary | ICD-10-CM | POA: Diagnosis not present

## 2018-07-22 LAB — TSH+FREE T4: TSH W/REFLEX TO FT4: 0.99 mIU/L

## 2018-07-22 MED ORDER — ESCITALOPRAM OXALATE 20 MG PO TABS: 30.0000 mg | ORAL_TABLET | Freq: Every day | ORAL | 1 refills | Status: DC

## 2018-07-22 NOTE — Progress Notes (Signed)
[facility] Outpatient Follow up visit   Patient Identification: Kayla Hood MRN:  [id number] Date of Evaluation:  07/22/2018 Referral Source: 52 years old currently married Caucasian female initially referred by primary care office for management of depression and anxiety  Chief Complaint:   Chief Complaint    Follow-up; Other     Visit Diagnosis:    ICD-10-CM   1. Major depression, recurrent, chronic (HCC) F33.9 TSH + free T4  2. GAD (generalized anxiety disorder) F41.1   3. 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  Feeling subdued, blah , depressed. altought husband prostate is being treated  no other etiology. Son is here not feeling empty nest  not interested in things, not going to gym  Aggravating factors;country living. Relationship. Son in college Modifying factors; bym Severity of depression; worse Sleep difficult without ambien  Duration 4 years  Past Psychiatric History: depression, anxiety   Previous Psychotropic Medications: Yes  VIIbryd made her agitated.  zoloft didn't help later Substance Abuse History in the last 12 months:  No.  Consequences of Substance Abuse: NA  Past Medical History:  Past Medical History:  Diagnosis Date  . Allergy   . Chicken pox   . Depression   . Hypertension   . Incontinence of urine   . UTI (lower urinary tract infection)     Past Surgical History:  Procedure Laterality Date  . ABLATION    . CERVICAL FUSION  2012  . pelvic laproscopic surgeries     4 prior to 2008    Family Psychiatric History: Dad; alcohol use in past. Depression. MOm side has depression and some bipolar in family  Family History:  Family History  Problem Relation Age of Onset  . Hyperlipidemia Mother   . Ulcerative colitis Mother   . Heart disease Father 27       CAD  . Hypertension Father   . Diverticulitis  Father   . Hypertension Brother   . Cancer Maternal Grandfather 1       Stomach Cancer  . Cancer Paternal Grandmother 24       Uterine Cancer  . Stroke Paternal Grandfather 45       CVA  . Cancer Maternal Grandmother     Social History:   Social History   Socioeconomic History  . Marital status: Married    Spouse name: Kyna Blahnik  . Number of children: 1  . Years of education: 65  . Highest education level: Not on file  Occupational History  . 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 Financial resource strain: Not on file  . Food insecurity:    Worry: Not on file    Inability: Not on file  . Transportation needs:    Medical: Not on file    Non-medical: Not on file  Tobacco Use  . Smoking status: Former Smoker    Packs/day: 0.25    Years: 5.00    Pack years: 1.25    Types: Cigarettes    Last attempt to quit: 03/21/2000    Years since quitting: 18.3  . Smokeless tobacco: Never Used  Substance and Sexual Activity  . Alcohol use: No    Alcohol/week: 1.2 oz    Types: 2 Cans of beer per week    Comment: Occasionally   .  Drug use: No  . Sexual activity: Yes    Birth control/protection: None  Lifestyle  . Physical activity:    Days per week: Not on file    Minutes per session: Not on file  . Stress: Not on file  Relationships  . Social connections:    Talks on phone: Not on file    Gets together: Not on file    Attends religious service: Not on file    Active member of club or organization: Not on file    Attends meetings of clubs or organizations: Not on file    Relationship status: Not on file  Other Topics Concern  . Not on file  Social History Narrative  . Not on file     Allergies:   Allergies  Allergen Reactions  . Promethazine Anaphylaxis and Other (See Comments)    Other Reaction: OTHER REACTION  . Phenergan [Promethazine Hcl] Rash    Metabolic Disorder Labs: No results  found for: HGBA1C, MPG No results found for: PROLACTIN Lab Results  Component Value Date   CHOL 253 (H) 12/22/2015   TRIG 186 (H) 12/22/2015   HDL 55 12/22/2015   CHOLHDL 4.6 12/22/2015   VLDL 37 (H) 12/22/2015   LDLCALC 161 (H) 12/22/2015   LDLCALC 129 (H) 07/08/2015     Current Medications: Current Outpatient Medications  Medication Sig Dispense Refill  . amLODipine (NORVASC) 10 MG tablet Take 1 tablet (10 mg total) by mouth daily. 30 tablet 3  . escitalopram (LEXAPRO) 20 MG tablet TAKE 1 TABLET(20 MG) BY MOUTH DAILY 30 tablet 2  . ibuprofen (ADVIL,MOTRIN) 200 MG tablet Take 600 mg by mouth every 6 (six) hours as needed for pain.    Marland Kitchen zolpidem (AMBIEN) 10 MG tablet Take 1 tablet (10 mg total) by mouth at bedtime. 30 tablet 5  . cyclobenzaprine (FLEXERIL) 10 MG tablet One half tab PO qHS, then increase gradually to one tab TID. (Patient not taking: Reported on 07/22/2018) 30 tablet 0  . diazepam (VALIUM) 5 MG tablet Take 1 to 2 tablets p.o. 2 hours before procedure or imaging. (Patient not taking: Reported on 07/22/2018) 2 tablet 0  . escitalopram (LEXAPRO) 20 MG tablet Take 1.5 tablets (30 mg total) by mouth daily. 45 tablet 1  . Red Yeast Rice 600 MG CAPS Take by mouth.     No current facility-administered medications for this visit.       Psychiatric Specialty Exam: Review of Systems  Cardiovascular: Negative for chest pain and palpitations.  Skin: Negative for rash.  Neurological: Negative for tremors.  Psychiatric/Behavioral: Positive for depression.    Blood pressure 122/90, pulse 75, height 5\' 7"  (1.702 m), weight 186 lb (84.4 kg).Body mass index is 29.13 kg/m.  General Appearance: Neat  Eye Contact:  Fair  Speech:  Normal Rate  Volume:  Normal  Mood:depressed  Affect:  constricted  Thought Process:  Goal Directed  Orientation:  Full (Time, Place, and Person)  Thought Content:  Rumination  Suicidal Thoughts:  No  Homicidal Thoughts:  No  Memory:  Immediate;    Fair Recent;   Fair  Judgement:  Fair  Insight:  Fair  Psychomotor Activity:  Normal  Concentration:  Concentration: Fair and Attention Span: Fair  Recall:  [employer] of Knowledge:Fair  Language: Fair  Akathisia:  Negative  Handed:  Right  AIMS (if indicated):    Assets:  Desire for Improvement  ADL's:  Intact  Cognition: WNL  Sleep:  Fair with meds  Treatment Plan Summary: Medication management and Plan as follows  1. Major depression, recurrent , moderate: depression has returned. Will increase lexapro to 30mg .  Other options to consider If not gets better. Add wellbutrin or consider lamictal by next visit or earlier Will send for Tsh to rule out hypothryroidism  2. HDI:[id number]. Continue lexapro   3. Insomnia: baseline Fu 49m or earlier for concerns   Merian Capron, MD 7/29/201910:03 AM

## 2018-08-20 ENCOUNTER — Ambulatory Visit (INDEPENDENT_AMBULATORY_CARE_PROVIDER_SITE_OTHER): Admitting: Psychiatry

## 2018-08-20 ENCOUNTER — Other Ambulatory Visit: Payer: Self-pay

## 2018-08-20 ENCOUNTER — Encounter (HOSPITAL_COMMUNITY): Payer: Self-pay | Admitting: Psychiatry

## 2018-08-20 VITALS — BP 140/92 | HR 89 | Ht 67.0 in | Wt 190.0 lb

## 2018-08-20 DIAGNOSIS — F411 Generalized anxiety disorder: Secondary | ICD-10-CM

## 2018-08-20 DIAGNOSIS — F339 Major depressive disorder, recurrent, unspecified: Secondary | ICD-10-CM

## 2018-08-20 DIAGNOSIS — F5102 Adjustment insomnia: Secondary | ICD-10-CM | POA: Diagnosis not present

## 2018-08-20 DIAGNOSIS — Z87891 Personal history of nicotine dependence: Secondary | ICD-10-CM

## 2018-08-20 NOTE — Progress Notes (Signed)
[facility] Outpatient Follow up visit   Patient Identification: Kayla Hood MRN:  [id number] Date of Evaluation:  08/20/2018 Referral Source: 52 years old currently married Caucasian female initially referred by primary care office for management of depression and anxiety  Chief Complaint:   Chief Complaint    Follow-up; Other     Visit Diagnosis:    ICD-10-CM   1. Major depression, recurrent, chronic (HCC) F33.9   2. GAD (generalized anxiety disorder) F41.1   3. 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  Feeling subdued, last visit increase her Lexapro to 30 mg she did not start it says that she wanted to give him more time still feels down depressed or low but anxious husband is supportive does not feel empty nest syndrome tries to keep herself busy with art work  Wanted to consider wellbutrin or lexapro discuss this visit again.  Aggravating factors;country living. Relationship. Son in college Modifying factors; keeps busy Severity of depression; worse Sleep difficult without ambien but not taking ambien.   Duration 4 years  Past Psychiatric History: depression, anxiety   Previous Psychotropic Medications: Yes  VIIbryd made her agitated.  zoloft didn't help later Substance Abuse History in the last 12 months:  No.  Consequences of Substance Abuse: NA  Past Medical History:  Past Medical History:  Diagnosis Date  . Allergy   . Chicken pox   . Depression   . Hypertension   . Incontinence of urine   . UTI (lower urinary tract infection)     Past Surgical History:  Procedure Laterality Date  . ABLATION    . CERVICAL FUSION  2012  . pelvic laproscopic surgeries     4 prior to 2008    Family Psychiatric History: Dad; alcohol use in past. Depression. MOm side has depression and some bipolar in family  Family History:  Family History   Problem Relation Age of Onset  . Hyperlipidemia Mother   . Ulcerative colitis Mother   . Heart disease Father 33       CAD  . Hypertension Father   . Diverticulitis Father   . Hypertension Brother   . Cancer Maternal Grandfather 28       Stomach Cancer  . Cancer Paternal Grandmother 59       Uterine Cancer  . Stroke Paternal Grandfather 48       CVA  . Cancer Maternal Grandmother     Social History:   Social History   Socioeconomic History  . Marital status: Married    Spouse name: Magdalene Tardiff  . Number of children: 1  . Years of education: 39  . Highest education level: Not on file  Occupational History  . 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 Financial resource strain: Not on file  . Food insecurity:    Worry: Not on file    Inability: Not on file  . Transportation needs:    Medical: Not on file    Non-medical: Not on file  Tobacco Use  . Smoking status: Former Smoker    Packs/day: 0.25    Years: 5.00    Pack years: 1.25    Types: Cigarettes    Last attempt to quit: 03/21/2000    Years since quitting: 18.4  . Smokeless tobacco: Never  Used  Substance and Sexual Activity  . Alcohol use: No    Alcohol/week: 2.0 standard drinks    Types: 2 Cans of beer per week    Comment: Occasionally   . Drug use: No  . Sexual activity: Yes    Birth control/protection: None  Lifestyle  . Physical activity:    Days per week: Not on file    Minutes per session: Not on file  . Stress: Not on file  Relationships  . Social connections:    Talks on phone: Not on file    Gets together: Not on file    Attends religious service: Not on file    Active member of club or organization: Not on file    Attends meetings of clubs or organizations: Not on file    Relationship status: Not on file  Other Topics Concern  . Not on file  Social History Narrative  . Not on file     Allergies:   Allergies   Allergen Reactions  . Promethazine Anaphylaxis and Other (See Comments)    Other Reaction: OTHER REACTION  . Phenergan [Promethazine Hcl] Rash    Metabolic Disorder Labs: No results found for: HGBA1C, MPG No results found for: PROLACTIN Lab Results  Component Value Date   CHOL 253 (H) 12/22/2015   TRIG 186 (H) 12/22/2015   HDL 55 12/22/2015   CHOLHDL 4.6 12/22/2015   VLDL 37 (H) 12/22/2015   LDLCALC 161 (H) 12/22/2015   LDLCALC 129 (H) 07/08/2015     Current Medications: Current Outpatient Medications  Medication Sig Dispense Refill  . amLODipine (NORVASC) 10 MG tablet Take 1 tablet (10 mg total) by mouth daily. 30 tablet 3  . escitalopram (LEXAPRO) 20 MG tablet TAKE 1 TABLET(20 MG) BY MOUTH DAILY 30 tablet 2  . escitalopram (LEXAPRO) 20 MG tablet Take 1.5 tablets (30 mg total) by mouth daily. 45 tablet 1  . ibuprofen (ADVIL,MOTRIN) 200 MG tablet Take 600 mg by mouth every 6 (six) hours as needed for pain.    . Red Yeast Rice 600 MG CAPS Take by mouth.    . zolpidem (AMBIEN) 10 MG tablet Take 1 tablet (10 mg total) by mouth at bedtime. 30 tablet 5  . cyclobenzaprine (FLEXERIL) 10 MG tablet One half tab PO qHS, then increase gradually to one tab TID. (Patient not taking: Reported on 07/22/2018) 30 tablet 0  . diazepam (VALIUM) 5 MG tablet Take 1 to 2 tablets p.o. 2 hours before procedure or imaging. (Patient not taking: Reported on 07/22/2018) 2 tablet 0   No current facility-administered medications for this visit.       Psychiatric Specialty Exam: Review of Systems  Cardiovascular: Negative for chest pain and palpitations.  Skin: Negative for rash.  Neurological: Negative for tremors.  Psychiatric/Behavioral: Positive for depression. Negative for suicidal ideas.    Blood pressure (!) 140/92, pulse 89, height 5\' 7"  (1.702 m), weight 190 lb (86.2 kg).Body mass index is 29.76 kg/m.  General Appearance: Neat  Eye Contact:  Fair  Speech:  Normal Rate  Volume:  Normal   Mood:subdued  Affect:  constricted  Thought Process:  Goal Directed  Orientation:  Full (Time, Place, and Person)  Thought Content:  Rumination  Suicidal Thoughts:  No  Homicidal Thoughts:  No  Memory:  Immediate;   Fair Recent;   Fair  Judgement:  Fair  Insight:  Fair  Psychomotor Activity:  Normal  Concentration:  Concentration: Fair and Attention Span: Fair  Recall:  [place]  Language: Fair  Akathisia:  Negative  Handed:  Right  AIMS (if indicated):    Assets:  Desire for Improvement  ADL's:  Intact  Cognition: WNL  Sleep:  Fair with meds    Treatment Plan Summary: Medication management and Plan as follows  1. Major depression, recurrent , moderate: feeling down. Now willing to increase lexapro to 30mg . May consider wellbutrin or lamictal if no improvement TSh reviewed.   2. EQF:[id number]. Continue lexapro and increase as above   3. Insomnia: baseline.  Not taking Hilda Lias 35m or earlier for concerns   Merian Capron, MD 8/27/201910:59 AM

## 2018-10-01 IMAGING — DX DG CERVICAL SPINE COMPLETE 4+V
5 series · 5 of 5 positions shown · non-contrast
Comparison: Cervical MRI September 05, 2011 and lateral cervical
radiograph February 09, 2012

CLINICAL DATA: Left upper extremity radicular symptoms

EXAM:
CERVICAL SPINE - COMPLETE 4+ VIEW

[c-spine lat]
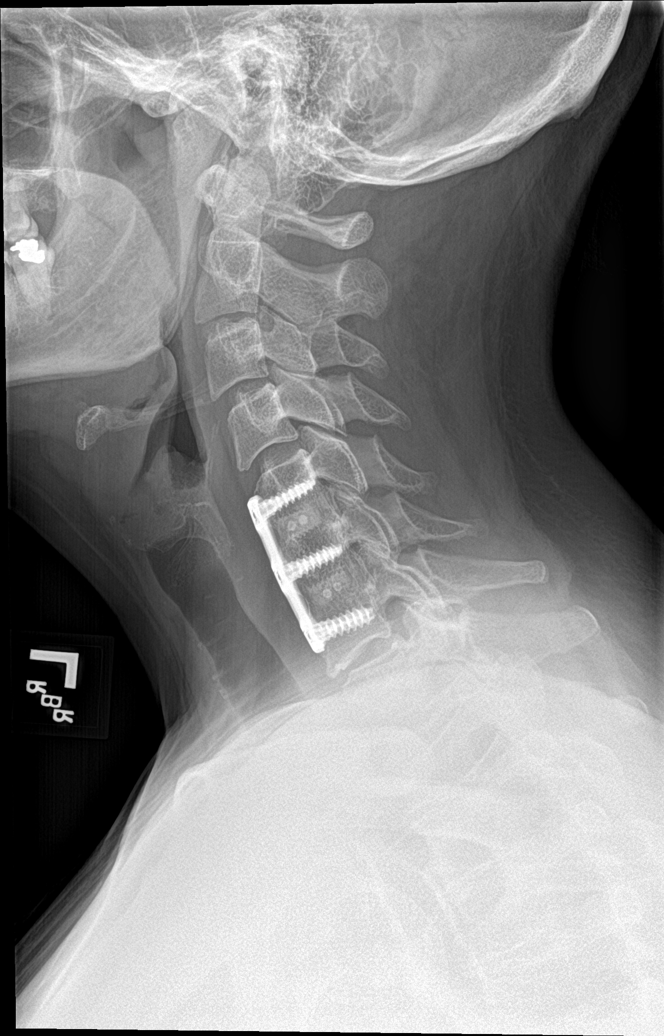

[c-spine obl (1 of 2)]
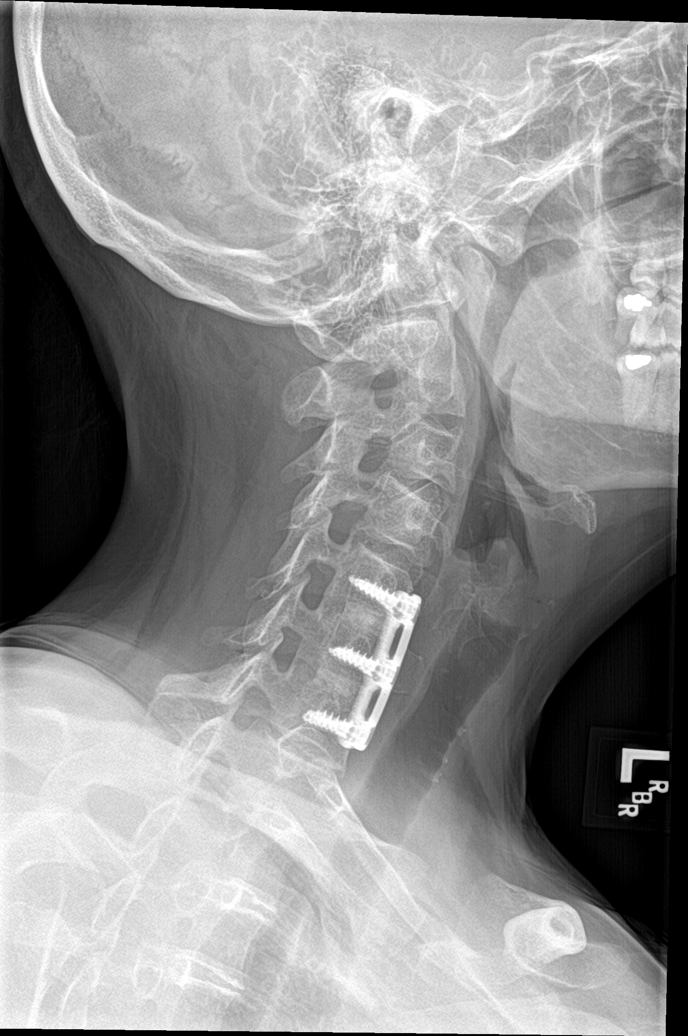

[c-spine obl (2 of 2)]
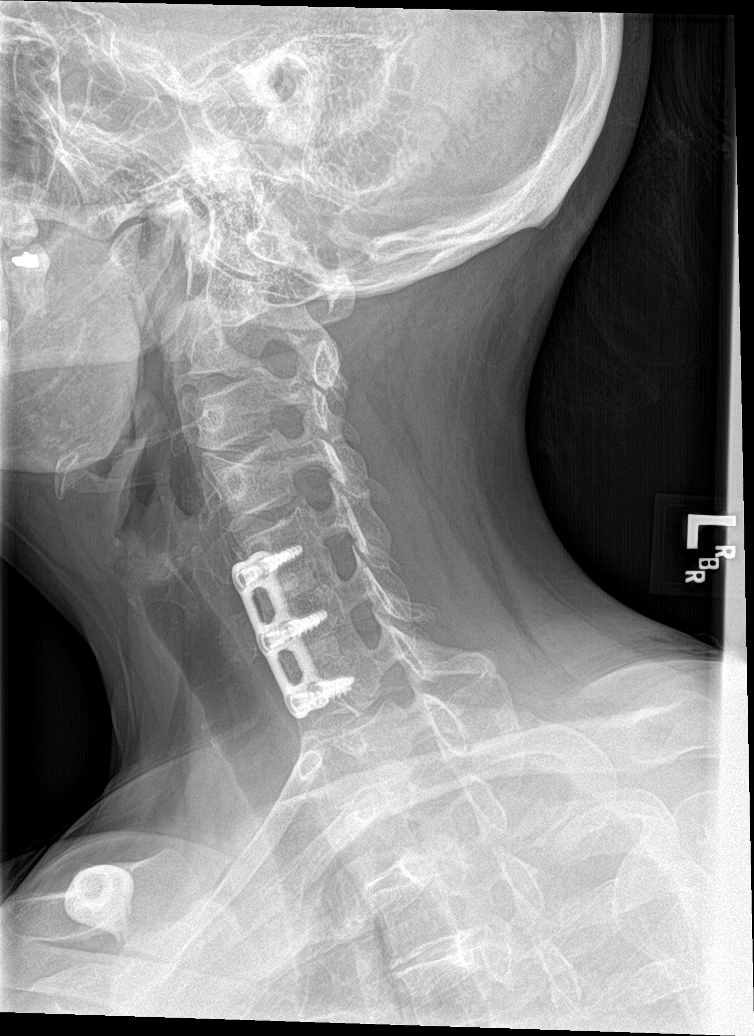

[c-spine ap]
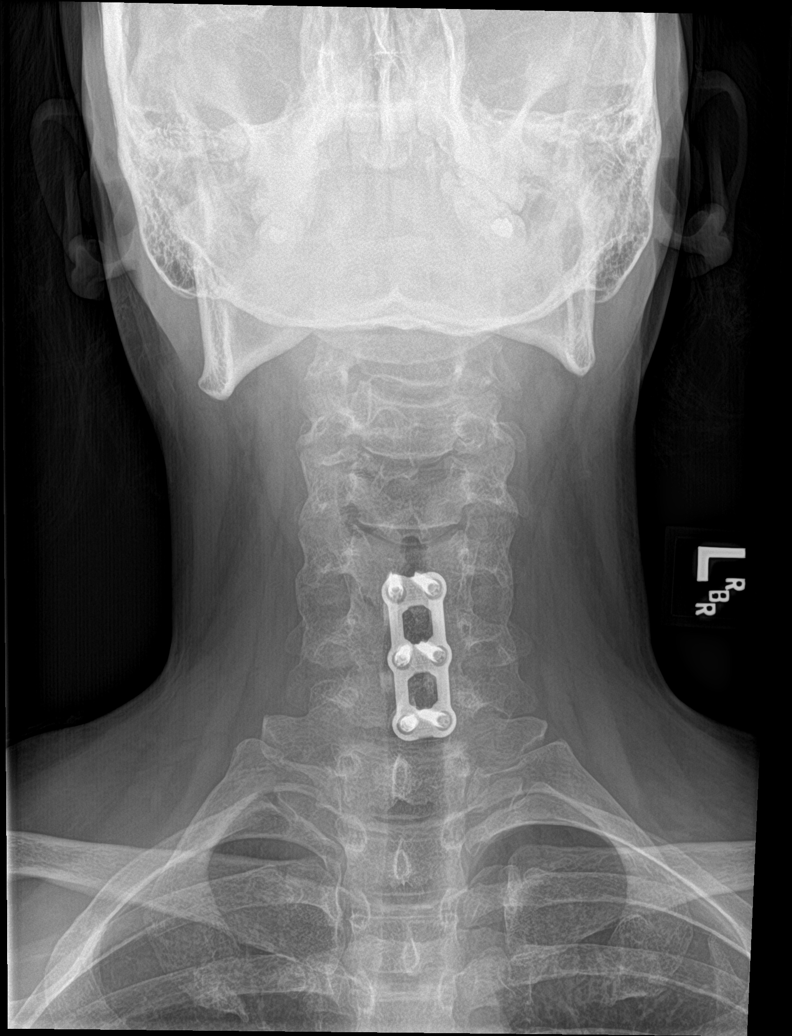

[c-spine open mouth]
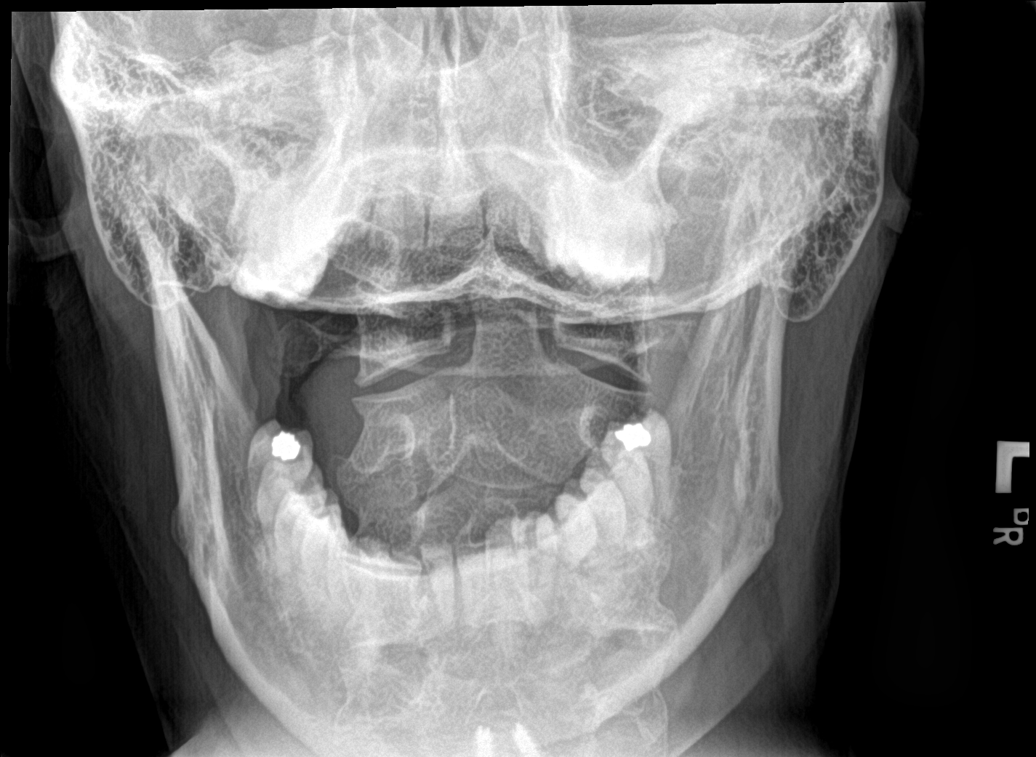

[5 of 5 positions shown; findings below may reference images not displayed]

FINDINGS: Frontal, lateral, open-mouth odontoid, and bilateral oblique views
were obtained. The patient is status post anterior screw and plate
fixation from C5-C7 with support hardware intact. There are disc
spacers at C5-6 and C6-7 5 day intact. There is no fracture or
spondylolisthesis. Prevertebral soft tissues and predental space
regions are normal. There is some mild to moderate disc space
narrowing at C7-T1. The disc spaces appear unremarkable. There is
mild facet hypertrophy at C5-6 bilaterally with slight exit
foraminal narrowing at this level. No other appreciable exit
foraminal narrowing. Lung apices are clear.
IMPRESSION: Postoperative change from C5-C7 with disc spacers and support
hardware intact. Areas of mild osteoarthritic change. No fracture or
spondylolisthesis.

## 2018-10-08 ENCOUNTER — Ambulatory Visit (INDEPENDENT_AMBULATORY_CARE_PROVIDER_SITE_OTHER): Admitting: Sports Medicine

## 2018-10-08 DIAGNOSIS — M7712 Lateral epicondylitis, left elbow: Secondary | ICD-10-CM | POA: Diagnosis not present

## 2018-10-08 NOTE — Progress Notes (Signed)
Subjective:    CC: Left elbow pain  HPI: For the past few weeks this pleasant 52 year old female has had severe pain that she localizes over her left lateral elbow, worse with gripping.  Moderate, persistent with radiation to the mid forearm dorsally.  I reviewed the past medical history, family history, social history, surgical history, and allergies today and no changes were needed.  Please see the problem list section below in epic for further details.  Past Medical History: Past Medical History:  Diagnosis Date  . Allergy   . Chicken pox   . Depression   . Hypertension   . Incontinence of urine   . UTI (lower urinary tract infection)    Past Surgical History: Past Surgical History:  Procedure Laterality Date  . ABLATION    . CERVICAL FUSION  2012  . pelvic laproscopic surgeries     4 prior to 2008   Social History: Social History   Socioeconomic History  . Marital status: Married    Spouse name: Adrain Butrick  . Number of children: 1  . Years of education: 16  . Highest education level: Not on file  Occupational History  . 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 Financial resource strain: Not on file  . Food insecurity:    Worry: Not on file    Inability: Not on file  . Transportation needs:    Medical: Not on file    Non-medical: Not on file  Tobacco Use  . Smoking status: Former Smoker    Packs/day: 0.25    Years: 5.00    Pack years: 1.25    Types: Cigarettes    Last attempt to quit: 03/21/2000    Years since quitting: 18.5  . Smokeless tobacco: Never Used  Substance and Sexual Activity  . Alcohol use: No    Alcohol/week: 2.0 standard drinks    Types: 2 Cans of beer per week    Comment: Occasionally   . Drug use: No  . Sexual activity: Yes    Birth control/protection: None  Lifestyle  . Physical activity:    Days per week: Not on file    Minutes per session: Not on file  .  Stress: Not on file  Relationships  . Social connections:    Talks on phone: Not on file    Gets together: Not on file    Attends religious service: Not on file    Active member of club or organization: Not on file    Attends meetings of clubs or organizations: Not on file    Relationship status: Not on file  Other Topics Concern  . Not on file  Social History Narrative  . Not on file   Family History: Family History  Problem Relation Age of Onset  . Hyperlipidemia Mother   . Ulcerative colitis Mother   . Heart disease Father 42       CAD  . Hypertension Father   . Diverticulitis Father   . Hypertension Brother   . Cancer Maternal Grandfather 30       Stomach Cancer  . Cancer Paternal Grandmother 43       Uterine Cancer  . Stroke Paternal Grandfather 80       CVA  . Cancer Maternal Grandmother    Allergies: Allergies  Allergen Reactions  . Promethazine Anaphylaxis and Other (See Comments)    Other Reaction: OTHER REACTION  . Phenergan [  Promethazine Hcl] Rash   Medications: See med rec.  Review of Systems: No fevers, chills, night sweats, weight loss, chest pain, or shortness of breath.   Objective:    General: Well Developed, well nourished, and in no acute distress.  Neuro: Alert and oriented x3, extra-ocular muscles intact, sensation grossly intact.  HEENT: Normocephalic, atraumatic, pupils equal round reactive to light, neck supple, no masses, no lymphadenopathy, thyroid nonpalpable.  Skin: Warm and dry, no rashes. Cardiac: Regular rate and rhythm, no murmurs rubs or gallops, no lower extremity edema.  Respiratory: Clear to auscultation bilaterally. Not using accessory muscles, speaking in full sentences. Left elbow: Unremarkable to inspection. Range of motion full pronation, supination, flexion, extension. Strength is full to all of the above directions Stable to varus, valgus stress. Negative moving valgus stress test. Severe tenderness over the common  extensor tendon origin. Ulnar nerve does not sublux. Negative cubital tunnel Tinel's.  Procedure: Real-time Ultrasound Guided Injection of left common extensor tendon origin Device: GE Logiq E  Verbal informed consent obtained.  Time-out conducted.  Noted no overlying erythema, induration, or other signs of local infection.  Skin prepped in a sterile fashion.  Local anesthesia: Topical Ethyl chloride.  With sterile technique and under real time ultrasound guidance: Noted mild hypoechoic change of the common extensor tendon origin, 25-gauge needle advanced and we injected medication both superficial to and deep to the common extensor tendon for a total of 1 cc Kenalog 40, 1 cc lidocaine, 1 cc bupivacaine. Completed without difficulty  Pain immediately resolved suggesting accurate placement of the medication.  Advised to call if fevers/chills, erythema, induration, drainage, or persistent bleeding.  Images permanently stored and available for review in the ultrasound unit.  Impression: Technically successful ultrasound guided injection.  Impression and Recommendations:    History of left tennis elbow Left lateral epicondylitis. We did an injection over a year ago. Tennis elbow brace, rehab exercises given. Return to see me in 1 month. ___________________________________________ Gwen Her. [name], M.D., ABFM., CAQSM. Primary Care and Sports Medicine [facility]  Adjunct Professor of [employer]

## 2018-10-08 NOTE — Assessment & Plan Note (Signed)
Left lateral epicondylitis. We did an injection over a year ago. Tennis elbow brace, rehab exercises given. Return to see me in 1 month.

## 2018-10-09 ENCOUNTER — Encounter: Payer: Self-pay | Admitting: Sports Medicine

## 2018-10-21 ENCOUNTER — Other Ambulatory Visit: Payer: Self-pay | Admitting: Physician Assistant

## 2018-10-21 DIAGNOSIS — F5101 Primary insomnia: Secondary | ICD-10-CM

## 2018-10-21 NOTE — Telephone Encounter (Signed)
Needs appt

## 2018-10-23 ENCOUNTER — Ambulatory Visit: Admitting: Sports Medicine

## 2018-10-23 ENCOUNTER — Ambulatory Visit (HOSPITAL_COMMUNITY): Admitting: Psychiatry

## 2018-10-24 NOTE — Telephone Encounter (Signed)
Patient has been notified and appointment is being set up.

## 2018-10-29 ENCOUNTER — Ambulatory Visit (INDEPENDENT_AMBULATORY_CARE_PROVIDER_SITE_OTHER): Admitting: Physician Assistant

## 2018-10-29 ENCOUNTER — Encounter: Payer: Self-pay | Admitting: Physician Assistant

## 2018-10-29 VITALS — BP 133/96 | HR 80 | Ht 67.0 in | Wt 194.0 lb

## 2018-10-29 DIAGNOSIS — Z6830 Body mass index (BMI) 30.0-30.9, adult: Secondary | ICD-10-CM

## 2018-10-29 DIAGNOSIS — I10 Essential (primary) hypertension: Secondary | ICD-10-CM

## 2018-10-29 DIAGNOSIS — F5101 Primary insomnia: Secondary | ICD-10-CM | POA: Diagnosis not present

## 2018-10-29 DIAGNOSIS — E6609 Other obesity due to excess calories: Secondary | ICD-10-CM

## 2018-10-29 DIAGNOSIS — Z131 Encounter for screening for diabetes mellitus: Secondary | ICD-10-CM

## 2018-10-29 DIAGNOSIS — Z13220 Encounter for screening for lipoid disorders: Secondary | ICD-10-CM | POA: Diagnosis not present

## 2018-10-29 MED ORDER — ZOLPIDEM TARTRATE 10 MG PO TABS: ORAL_TABLET | ORAL | 5 refills | Status: DC

## 2018-10-29 NOTE — Progress Notes (Signed)
Subjective:    Patient ID: Kayla Hood, female    DOB: [birth year], 52 y.o.   MRN: [id number]  HPI  Pt is a 52 yo female with HTN, depression, anxiety, insomnia who presents to the clinic for refill of ambien.   Pt is doing well with ambien. She doesn't take every night. After about a week she is forced to take one to get a good nights rest. She needs refill.   She has gained about 10lbs this year. Her lexapro has been increased. Her mood is much better but not liking the weight gain. She is eating well and watching her diet. She admits to not exercising.   .. Active Ambulatory Problems    Diagnosis Date Noted  . Abnormal chest x-ray 03/03/2013  . Irregular heart beat 03/03/2013  . Sleep disturbance 03/03/2013  . General medical exam 03/03/2013  . Palpitations 03/21/2013  . Left hip pain 06/28/2013  . Tendonitis of shoulder 09/01/2013  . Hives 01/26/2014  . RLQ abdominal pain 01/26/2014  . Facial edema 02/04/2014  . Anxiety state 06/07/2015  . Depression with anxiety 07/08/2015  . Endometriosis 07/08/2015  . Fibroid 07/08/2015  . Exposure to blood or body fluid 11/24/2013  . 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 Hidradenitis suppurativa 12/22/2015  . Hot flashes due to surgical menopause 12/22/2015  . Hyperlipidemia 12/22/2015  . Essential hypertension, benign 12/22/2015  . Vaginal dryness, menopausal 12/22/2015  . Anxiety 12/22/2015  . Screening for diabetes mellitus 12/22/2015  . Nodule of finger of both hands 08/18/2016  . Abnormal weight gain 11/18/2016  . Diverticulitis of large intestine without perforation or abscess 01/23/2017  . Ear itching 03/12/2017  . Major depression, recurrent, chronic ([place]) 05/09/2017  . Lateral epicondylitis, left elbow 07/23/2017  . Numbness of left hand 09/03/2017  . Family history of early CAD 03/04/2018  . Primary insomnia 03/27/2018  . Overweight (BMI 25.0-29.9) 03/27/2018  . Right lumbar radiculitis 05/16/2018  . Pre-diabetes  10/30/2018  . Class 1 obesity due to excess calories without serious comorbidity with body mass index (BMI) of 30.0 to 30.9 in adult 11/01/2018   Resolved Ambulatory Problems    Diagnosis Date Noted  . Viral URI 03/09/2015   Past Medical History:  Diagnosis Date  . Allergy   . Chicken pox   . Depression   . Hypertension   . Incontinence of urine   . UTI (lower urinary tract infection)      Review of Systems    see HPI>  Objective:   Physical Exam  Constitutional: She is oriented to person, place, and time. She appears well-developed and well-nourished.  HENT:  Head: Normocephalic and atraumatic.  Cardiovascular: Normal rate and regular rhythm.  Pulmonary/Chest: Effort normal and breath sounds normal.  Neurological: She is alert and oriented to person, place, and time.  Psychiatric: She has a normal mood and affect. Her behavior is normal.          Assessment & Plan:  Marland KitchenMarland KitchenDiagnoses and all orders for this visit:  Primary insomnia -     zolpidem (AMBIEN) 10 MG tablet; TAKE 1 TABLET(10 MG) BY MOUTH AT BEDTIME  Screening for diabetes mellitus -     COMPLETE METABOLIC PANEL WITH GFR -     Hemoglobin A1c  Screening for lipid disorders -     Lipid Panel w/reflex Direct LDL  Class 1 obesity due to excess calories without serious comorbidity with body mass index (BMI) of 30.0 to 30.9 in adult -  TSH  Essential hypertension, benign   Pt needs fasting labs. Ordered today.   Refilled ambien.   Marland Kitchen.Discussed low carb diet with 1500 calories and 80g of protein.  Exercising at least 150 minutes a week.  My Fitness Pal could be a Microbiologist.   BP a little elevated will continue to watch.   Follow up in 6 months.

## 2018-10-30 ENCOUNTER — Encounter: Payer: Self-pay | Admitting: Physician Assistant

## 2018-10-30 DIAGNOSIS — R7303 Prediabetes: Secondary | ICD-10-CM

## 2018-10-30 HISTORY — DX: Prediabetes: R73.03

## 2018-10-30 LAB — COMPLETE METABOLIC PANEL WITH GFR
AG RATIO: 1.7 (calc) (ref 1.0–2.5)
ALBUMIN MSPROF: 4.8 g/dL (ref 3.6–5.1)
ALKALINE PHOSPHATASE (APISO): 95 U/L (ref 33–115)
ALT: 15 U/L (ref 6–29)
AST: 16 U/L (ref 10–35)
BUN: 12 mg/dL (ref 7–25)
CO2: 29 mmol/L (ref 20–32)
CREATININE: 0.72 mg/dL (ref 0.50–1.10)
Calcium: 10.4 mg/dL — ABNORMAL HIGH (ref 8.6–10.2)
Chloride: 102 mmol/L (ref 98–110)
GFR, EST NON AFRICAN AMERICAN: 98 mL/min/{1.73_m2} (ref >=60)
GFR, Est African American: 114 mL/min/{1.73_m2} (ref >=60)
GLOBULIN: 2.8 g/dL (ref 1.9–3.7)
Glucose, Bld: 97 mg/dL (ref 65–99)
POTASSIUM: 4.5 mmol/L (ref 3.5–5.3)
SODIUM: 139 mmol/L (ref 135–146)
Total Bilirubin: 0.9 mg/dL (ref 0.2–1.2)
Total Protein: 7.6 g/dL (ref 6.1–8.1)

## 2018-10-30 LAB — LIPID PANEL W/REFLEX DIRECT LDL
CHOLESTEROL: 271 mg/dL — AB (ref <200)
HDL: 56 mg/dL (ref >50)
LDL Cholesterol (Calc): 173 mg/dL (calc) — ABNORMAL HIGH
Non-HDL Cholesterol (Calc): 215 mg/dL (calc) — ABNORMAL HIGH (ref <130)
Total CHOL/HDL Ratio: 4.8 (calc) (ref <5.0)
Triglycerides: 255 mg/dL — ABNORMAL HIGH (ref <150)

## 2018-10-30 LAB — HEMOGLOBIN A1C
HEMOGLOBIN A1C: 5.8 %{Hb} — AB (ref <5.7)
Mean Plasma Glucose: 120 (calc)
eAG (mmol/L): 6.6 (calc)

## 2018-10-30 LAB — TSH: TSH: 1.06 mIU/L

## 2018-10-30 NOTE — Progress Notes (Signed)
Call pt: cholesterol remains elevated TG worse than last year. I believe that you would benefit from a statin(cholesterol lowering drug) to prevent CV risk. What are your thoughts?   Calcium just a tad elevated. Make sure taking vitamin D to help calcium get into bones. D3 1000 units.   Thyroid is great.   A!C is 5.8 that is in pre-diabetes. Greater than 6.5 is diabetes. Certainly start watching sugars/carbs in diet.

## 2018-11-01 DIAGNOSIS — E6609 Other obesity due to excess calories: Secondary | ICD-10-CM

## 2018-11-01 DIAGNOSIS — E66811 Other obesity due to excess calories: Secondary | ICD-10-CM

## 2018-11-01 DIAGNOSIS — Z6830 Body mass index (BMI) 30.0-30.9, adult: Secondary | ICD-10-CM

## 2018-11-01 HISTORY — DX: Other obesity due to excess calories: E66.811

## 2018-11-01 HISTORY — DX: Other obesity due to excess calories: E66.09

## 2018-11-19 ENCOUNTER — Encounter (HOSPITAL_COMMUNITY): Payer: Self-pay | Admitting: Psychiatry

## 2018-11-19 ENCOUNTER — Ambulatory Visit (INDEPENDENT_AMBULATORY_CARE_PROVIDER_SITE_OTHER): Admitting: Physician Assistant

## 2018-11-19 ENCOUNTER — Encounter: Payer: Self-pay | Admitting: Physician Assistant

## 2018-11-19 ENCOUNTER — Other Ambulatory Visit: Payer: Self-pay

## 2018-11-19 ENCOUNTER — Ambulatory Visit (INDEPENDENT_AMBULATORY_CARE_PROVIDER_SITE_OTHER): Admitting: Psychiatry

## 2018-11-19 VITALS — BP 141/84 | HR 75 | Temp 97.4°F | Wt 196.0 lb

## 2018-11-19 VITALS — BP 112/80 | HR 87 | Ht 67.0 in | Wt 196.0 lb

## 2018-11-19 DIAGNOSIS — Z1211 Encounter for screening for malignant neoplasm of colon: Secondary | ICD-10-CM | POA: Diagnosis not present

## 2018-11-19 DIAGNOSIS — I10 Essential (primary) hypertension: Secondary | ICD-10-CM | POA: Diagnosis not present

## 2018-11-19 DIAGNOSIS — F5102 Adjustment insomnia: Secondary | ICD-10-CM

## 2018-11-19 DIAGNOSIS — E782 Mixed hyperlipidemia: Secondary | ICD-10-CM | POA: Diagnosis not present

## 2018-11-19 DIAGNOSIS — F339 Major depressive disorder, recurrent, unspecified: Secondary | ICD-10-CM | POA: Diagnosis not present

## 2018-11-19 DIAGNOSIS — R7303 Prediabetes: Secondary | ICD-10-CM | POA: Diagnosis not present

## 2018-11-19 DIAGNOSIS — F411 Generalized anxiety disorder: Secondary | ICD-10-CM | POA: Diagnosis not present

## 2018-11-19 MED ORDER — METFORMIN HCL 1000 MG PO TABS: 1000.0000 mg | ORAL_TABLET | Freq: Two times a day (BID) | ORAL | 2 refills | Status: DC

## 2018-11-19 MED ORDER — ATORVASTATIN CALCIUM 10 MG PO TABS: 10.0000 mg | ORAL_TABLET | Freq: Every day | ORAL | 1 refills | Status: DC

## 2018-11-19 MED ORDER — ESCITALOPRAM OXALATE 20 MG PO TABS: 30.0000 mg | ORAL_TABLET | Freq: Every day | ORAL | 2 refills | Status: DC

## 2018-11-19 MED ORDER — ESCITALOPRAM OXALATE 20 MG PO TABS: 20.0000 mg | ORAL_TABLET | Freq: Every day | ORAL | 2 refills | Status: DC

## 2018-11-19 NOTE — Patient Instructions (Signed)
Prediabetes Eating Plan Prediabetes-also called impaired glucose tolerance or impaired fasting glucose-is a condition that causes blood sugar (blood glucose) levels to be higher than normal. Following a healthy diet can help to keep prediabetes under control. It can also help to lower the risk of type 2 diabetes and heart disease, which are increased in people who have prediabetes. Along with regular exercise, a healthy diet:  Promotes weight loss.  Helps to control blood sugar levels.  Helps to improve the way that the body uses insulin.  What do I need to know about this eating plan?  Use the glycemic index (GI) to plan your meals. The index tells you how quickly a food will raise your blood sugar. Choose low-GI foods. These foods take a longer time to raise blood sugar.  Pay close attention to the amount of carbohydrates in the food that you eat. Carbohydrates increase blood sugar levels.  Keep track of how many calories you take in. Eating the right amount of calories will help you to achieve a healthy weight. Losing about 7 percent of your starting weight can help to prevent type 2 diabetes.  You may want to follow a Mediterranean diet. This diet includes a lot of vegetables, lean meats or fish, whole grains, fruits, and healthy oils and fats. What foods can I eat? Grains Whole grains, such as whole-wheat or whole-grain breads, crackers, cereals, and pasta. Unsweetened oatmeal. Bulgur. Barley. Quinoa. Brown rice. Corn or whole-wheat flour tortillas or taco shells. Vegetables Lettuce. Spinach. Peas. Beets. Cauliflower. Cabbage. Broccoli. Carrots. Tomatoes. Squash. Eggplant. Herbs. Peppers. Onions. Cucumbers. Brussels sprouts. Fruits Berries. Bananas. Apples. Oranges. Grapes. Papaya. Mango. Pomegranate. Kiwi. Grapefruit. Cherries. Meats and Other Protein Sources Seafood. Lean meats, such as chicken and turkey or lean cuts of pork and beef. Tofu. Eggs. Nuts. Beans. Dairy Low-fat or  fat-free dairy products, such as yogurt, cottage cheese, and cheese. Beverages Water. Tea. Coffee. Sugar-free or diet soda. Seltzer water. Milk. Milk alternatives, such as soy or almond milk. Condiments Mustard. Relish. Low-fat, low-sugar ketchup. Low-fat, low-sugar barbecue sauce. Low-fat or fat-free mayonnaise. Sweets and Desserts Sugar-free or low-fat pudding. Sugar-free or low-fat ice cream and other frozen treats. Fats and Oils Avocado. Walnuts. Olive oil. The items listed above may not be a complete list of recommended foods or beverages. Contact your dietitian for more options. What foods are not recommended? Grains Refined white flour and flour products, such as bread, pasta, snack foods, and cereals. Beverages Sweetened drinks, such as sweet iced tea and soda. Sweets and Desserts Baked goods, such as cake, cupcakes, pastries, cookies, and cheesecake. The items listed above may not be a complete list of foods and beverages to avoid. Contact your dietitian for more information. This information is not intended to replace advice given to you by your health care provider. Make sure you discuss any questions you have with your health care provider. Document Released: 04/27/2015 Document Revised: 05/18/2016 Document Reviewed: 01/06/2015 Elsevier Interactive Patient Education  2017 Elsevier Inc.  

## 2018-11-19 NOTE — Progress Notes (Signed)
Subjective:    Patient ID: Kayla Hood, female    DOB: 1966-01-27, 52 y.o.   MRN: [id number]  HPI Pt is a 52 yo female who presents to the clinic to go over labs drawn recently.   .. Active Ambulatory Problems    Diagnosis Date Noted  . Abnormal chest x-ray 03/03/2013  . Irregular heart beat 03/03/2013  . Sleep disturbance 03/03/2013  . General medical exam 03/03/2013  . Palpitations 03/21/2013  . Left hip pain 06/28/2013  . Tendonitis of shoulder 09/01/2013  . Hives 01/26/2014  . RLQ abdominal pain 01/26/2014  . Facial edema 02/04/2014  . Anxiety state 06/07/2015  . Depression with anxiety 07/08/2015  . Endometriosis 07/08/2015  . Fibroid 07/08/2015  . Exposure to blood or body fluid 11/24/2013  . 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 Hidradenitis suppurativa 12/22/2015  . Hot flashes due to surgical menopause 12/22/2015  . Hyperlipidemia 12/22/2015  . Essential hypertension, benign 12/22/2015  . Vaginal dryness, menopausal 12/22/2015  . Anxiety 12/22/2015  . Screening for diabetes mellitus 12/22/2015  . Nodule of finger of both hands 08/18/2016  . Abnormal weight gain 11/18/2016  . Diverticulitis of large intestine without perforation or abscess 01/23/2017  . Ear itching 03/12/2017  . Major depression, recurrent, chronic ([place]) 05/09/2017  . Lateral epicondylitis, left elbow 07/23/2017  . Numbness of left hand 09/03/2017  . Family history of early CAD 03/04/2018  . Primary insomnia 03/27/2018  . Overweight (BMI 25.0-29.9) 03/27/2018  . Right lumbar radiculitis 05/16/2018  . Pre-diabetes 10/30/2018  . Class 1 obesity due to excess calories without serious comorbidity with body mass index (BMI) of 30.0 to 30.9 in adult 11/01/2018   Resolved Ambulatory Problems    Diagnosis Date Noted  . Viral URI 03/09/2015   Past Medical History:  Diagnosis Date  . Allergy   . Chicken pox   . Depression   . Hypertension   . Incontinence of urine   . UTI (lower urinary tract  infection)       Review of Systems See HPI.     Objective:   Physical Exam  Constitutional: She is oriented to person, place, and time. She appears well-developed and well-nourished.  HENT:  Head: Normocephalic and atraumatic.  Cardiovascular: Normal rate and regular rhythm.  Pulmonary/Chest: Effort normal and breath sounds normal.  Neurological: She is alert and oriented to person, place, and time.  Psychiatric: She has a normal mood and affect. Her behavior is normal.          Assessment & Plan:  Marland KitchenMarland KitchenPrakriti was seen today for follow-up.  Diagnoses and all orders for this visit:  Pre-diabetes -     metFORMIN (GLUCOPHAGE) 1000 MG tablet; Take 1 tablet (1,000 mg total) by mouth 2 (two) times daily with a meal. -     Amb ref to Medical Nutrition Therapy-MNT  Essential hypertension, benign  Mixed hyperlipidemia -     atorvastatin (LIPITOR) 10 MG tablet; Take 1 tablet (10 mg total) by mouth daily.  went over labs in office.   Pt did not take her BP medication this morning. Continue norvasc.   Added metformin. Discussed side effects. Discussed diabetic diet and exercise. Follow up in 3 months. Will make nutrition referral.   Discussed CV risk. Pt agrees to start lipitor. Discussed side effects. Will check in 4-6 months to see improvement.   Marland Kitchen.Spent 30 minutes with patient and greater than 50 percent of visit spent counseling patient regarding treatment plan.

## 2018-11-19 NOTE — Progress Notes (Signed)
[facility] Outpatient Follow up visit   Patient Identification: Kayla Hood MRN:  [id number] Date of Evaluation:  11/19/2018 Referral Source: 52 years old currently married Caucasian female initially referred by primary care office for management of depression and anxiety  Chief Complaint:   Chief Complaint    Follow-up; Other     Visit Diagnosis:  No diagnosis found.  History of Present Illness:   Patient is presented with anxiety and depression related to being more lonely since her son went to college.  Less subdued. Now back to 20mg  lexapro. Says helping Winter can be depressing but managing it. Teaches art classes   Wanted to consider wellbutrin or lexapro discuss this visit again.  Aggravating factors;country living. Relationship. Son in college Modifying factors; keeps busy Severity of depression; worse Sleep problems without [name], now gets from [place] care  Duration 4 years  Past Psychiatric History: depression, anxiety   Previous Psychotropic Medications: Yes  VIIbryd made her agitated.  zoloft didn't help later Substance Abuse History in the last 12 months:  No.  Consequences of Substance Abuse: NA  Past Medical History:  Past Medical History:  Diagnosis Date  . Allergy   . Chicken pox   . Depression   . Hypertension   . Incontinence of urine   . UTI (lower urinary tract infection)     Past Surgical History:  Procedure Laterality Date  . ABLATION    . CERVICAL FUSION  2012  . pelvic laproscopic surgeries     4 prior to 2008    Family Psychiatric History: Dad; alcohol use in past. Depression. MOm side has depression and some bipolar in family  Family History:  Family History  Problem Relation Age of Onset  . Hyperlipidemia Mother   . Ulcerative colitis Mother   . Heart disease Father 3       CAD  . Hypertension Father   . Diverticulitis Father   . Hypertension Brother   . Cancer Maternal Grandfather 40       Stomach Cancer  . Cancer  Paternal Grandmother 5       Uterine Cancer  . Stroke Paternal Grandfather 88       CVA  . Cancer Maternal Grandmother     Social History:   Social History   Socioeconomic History  . Marital status: Married    Spouse name: Dajanee Voorheis  . Number of children: 1  . Years of education: 27  . Highest education level: Not on file  Occupational History  . 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 Financial resource strain: Not on file  . Food insecurity:    Worry: Not on file    Inability: Not on file  . Transportation needs:    Medical: Not on file    Non-medical: Not on file  Tobacco Use  . Smoking status: Former Smoker    Packs/day: 0.25    Years: 5.00    Pack years: 1.25    Types: Cigarettes    Last attempt to quit: 03/21/2000    Years since quitting: 18.6  . Smokeless tobacco: Never Used  Substance and Sexual Activity  . Alcohol use: No    Alcohol/week: 2.0 standard drinks    Types: 2 Cans of beer per week    Comment: Occasionally   . Drug use: No  . Sexual activity: Yes    Birth control/protection: None  Lifestyle  . Physical  activity:    Days per week: Not on file    Minutes per session: Not on file  . Stress: Not on file  Relationships  . Social connections:    Talks on phone: Not on file    Gets together: Not on file    Attends religious service: Not on file    Active member of club or organization: Not on file    Attends meetings of clubs or organizations: Not on file    Relationship status: Not on file  Other Topics Concern  . Not on file  Social History Narrative  . Not on file     Allergies:   Allergies  Allergen Reactions  . Promethazine Anaphylaxis and Other (See Comments)    Other Reaction: OTHER REACTION  . Phenergan [Promethazine Hcl] Rash    Metabolic Disorder Labs: Lab Results  Component Value Date   HGBA1C 5.8 (H) 10/29/2018   MPG 120 10/29/2018   No results  found for: PROLACTIN Lab Results  Component Value Date   CHOL 271 (H) 10/29/2018   TRIG 255 (H) 10/29/2018   HDL 56 10/29/2018   CHOLHDL 4.8 10/29/2018   VLDL 37 (H) 12/22/2015   LDLCALC 173 (H) 10/29/2018   LDLCALC 161 (H) 12/22/2015     Current Medications: Current Outpatient Medications  Medication Sig Dispense Refill  . amLODipine (NORVASC) 10 MG tablet Take 1 tablet (10 mg total) by mouth daily. 30 tablet 3  . escitalopram (LEXAPRO) 20 MG tablet Take 1 tablet (20 mg total) by mouth daily. Dose changed to 20mg . Delete prior refills 30 tablet 2  . ibuprofen (ADVIL,MOTRIN) 200 MG tablet Take 600 mg by mouth every 6 (six) hours as needed for pain.    Marland Kitchen zolpidem (AMBIEN) 10 MG tablet TAKE 1 TABLET(10 MG) BY MOUTH AT BEDTIME 30 tablet 5  . Red Yeast Rice 600 MG CAPS Take by mouth.     No current facility-administered medications for this visit.       Psychiatric Specialty Exam: Review of Systems  Cardiovascular: Negative for chest pain and palpitations.  Skin: Negative for itching.  Neurological: Negative for tremors.  Psychiatric/Behavioral: Negative for suicidal ideas.    Blood pressure 112/80, pulse 87, height 5\' 7"  (1.702 m), weight 196 lb (88.9 kg).Body mass index is 30.7 kg/m.  General Appearance: Neat  Eye Contact:  Fair  Speech:  Normal Rate  Volume:  Normal  Mood:better  Affect:  constricted  Thought Process:  Goal Directed  Orientation:  Full (Time, Place, and Person)  Thought Content:  Rumination  Suicidal Thoughts:  No  Homicidal Thoughts:  No  Memory:  Immediate;   Fair Recent;   Fair  Judgement:  Fair  Insight:  Fair  Psychomotor Activity:  Normal  Concentration:  Concentration: Fair and Attention Span: Fair  Recall:  [employer] of Knowledge:Fair  Language: Fair  Akathisia:  Negative  Handed:  Right  AIMS (if indicated):    Assets:  Desire for Improvement  ADL's:  Intact  Cognition: WNL  Sleep:  Fair with meds    Treatment Plan  Summary: Medication management and Plan as follows  1. Major depression, recurrent , moderate: better. Continue lexapro now at 20mg .  2. UTM:[id number]. Continue lexapro   3. Insomnia: now on ambien , it helps. Reviewed sleep hygiene Wants to come back in February when its cold weather Fur 66m.    Merian Capron, MD 11/26/20198:52 AM

## 2018-11-20 ENCOUNTER — Encounter: Payer: Self-pay | Admitting: Physician Assistant

## 2018-12-14 ENCOUNTER — Other Ambulatory Visit: Payer: Self-pay | Admitting: Physician Assistant

## 2018-12-14 DIAGNOSIS — F5101 Primary insomnia: Secondary | ICD-10-CM

## 2019-02-05 ENCOUNTER — Encounter: Payer: Self-pay | Admitting: Sports Medicine

## 2019-02-05 ENCOUNTER — Ambulatory Visit (INDEPENDENT_AMBULATORY_CARE_PROVIDER_SITE_OTHER): Admitting: Sports Medicine

## 2019-02-05 ENCOUNTER — Ambulatory Visit (INDEPENDENT_AMBULATORY_CARE_PROVIDER_SITE_OTHER)

## 2019-02-05 DIAGNOSIS — M62830 Muscle spasm of back: Secondary | ICD-10-CM | POA: Diagnosis not present

## 2019-02-05 DIAGNOSIS — M545 Low back pain: Secondary | ICD-10-CM

## 2019-02-05 DIAGNOSIS — M5136 Other intervertebral disc degeneration, lumbar region: Secondary | ICD-10-CM

## 2019-02-05 DIAGNOSIS — M51369 Other intervertebral disc degeneration, lumbar region without mention of lumbar back pain or lower extremity pain: Secondary | ICD-10-CM

## 2019-02-05 MED ORDER — PREDNISONE 50 MG PO TABS: ORAL_TABLET | ORAL | 0 refills | Status: DC

## 2019-02-05 MED ORDER — METHOCARBAMOL 500 MG PO TABS: 500.0000 mg | ORAL_TABLET | Freq: Three times a day (TID) | ORAL | 0 refills | Status: DC

## 2019-02-05 NOTE — Assessment & Plan Note (Signed)
Acute mid low back pain. Likely discogenic, started conservatively, prednisone, Robaxin, formal PT. Thoracic and lumbar spine x-rays as the pain is near the thoracolumbar junction. She does have significant pain out of proportion to degree of palpation suggesting a myofascial/fibromyalgia component as well. Return to see me in 4 to 6 weeks, thoracolumbar MRI if no better.

## 2019-02-05 NOTE — Progress Notes (Signed)
Subjective:    CC: Back pain  HPI: Kayla Hood is a pleasant 53 year old female, she has a history of cervical DDD post ACDF, she also has lumbar DDD, L5-S1 that resolved with conservative measures.  More recently she has had a worsening of pain in the upper lumbar spine, left-sided, worse with twisting, no bowel or bladder dysfunction, saddle numbness, constitutional symptoms.  Pain is axial without radicular component.  No dyspnea, no pleuritic component, no fevers, chills, dysuria, hematuria.  No abdominal pain, nausea, vomiting, diarrhea, constipation.  I reviewed the past medical history, family history, social history, surgical history, and allergies today and no changes were needed.  Please see the problem list section below in epic for further details.  Past Medical History: Past Medical History:  Diagnosis Date  . Allergy   . Chicken pox   . Depression   . Hypertension   . Incontinence of urine   . UTI (lower urinary tract infection)    Past Surgical History: Past Surgical History:  Procedure Laterality Date  . ABLATION    . CERVICAL FUSION  2012  . pelvic laproscopic surgeries     4 prior to 2008   Social History: Social History   Socioeconomic History  . Marital status: Married    Spouse name: Monika Chestang  . Number of children: 1  . Years of education: 17  . Highest education level: Not on file  Occupational History  . Occupation: Print production planner for [employer]: [employer]  Social Needs  . Financial resource strain: Not on file  . Food insecurity:    Worry: Not on file    Inability: Not on file  . Transportation needs:    Medical: Not on file    Non-medical: Not on file  Tobacco Use  . Smoking status: Never Smoker  . Smokeless tobacco: Never Used  Substance and Sexual Activity  . Alcohol use: No    Alcohol/week: 2.0 standard drinks    Types: 2 Cans of beer per week    Comment: Occasionally   . Drug use: No  . Sexual  activity: Yes    Birth control/protection: None  Lifestyle  . Physical activity:    Days per week: Not on file    Minutes per session: Not on file  . Stress: Not on file  Relationships  . Social connections:    Talks on phone: Not on file    Gets together: Not on file    Attends religious service: Not on file    Active member of club or organization: Not on file    Attends meetings of clubs or organizations: Not on file    Relationship status: Not on file  Other Topics Concern  . Not on file  Social History Narrative  . Not on file   Family History: Family History  Problem Relation Age of Onset  . Hyperlipidemia Mother   . Ulcerative colitis Mother   . Heart disease Father 28       CAD  . Hypertension Father   . Diverticulitis Father   . Hypertension Brother   . Cancer Maternal Grandfather 79       Stomach Cancer  . Cancer Paternal Grandmother 37       Uterine Cancer  . Stroke Paternal Grandfather 3       CVA  . Cancer Maternal Grandmother    Allergies: Allergies  Allergen Reactions  . Promethazine Anaphylaxis and Other (See Comments)  Other Reaction: OTHER REACTION  . Phenergan [Promethazine Hcl] Rash   Medications: See med rec.  Review of Systems: No fevers, chills, night sweats, weight loss, chest pain, or shortness of breath.   Objective:    General: Well Developed, well nourished, and in no acute distress.  Neuro: Alert and oriented x3, extra-ocular muscles intact, sensation grossly intact.  HEENT: Normocephalic, atraumatic, pupils equal round reactive to light, neck supple, no masses, no lymphadenopathy, thyroid nonpalpable.  Skin: Warm and dry, no rashes. Cardiac: Regular rate and rhythm, no murmurs rubs or gallops, no lower extremity edema.  Respiratory: Clear to auscultation bilaterally. Not using accessory muscles, speaking in full sentences. Back Exam:  Inspection: Unremarkable  Motion: Flexion 45 deg, Extension 45 deg, Side Bending to 45 deg  bilaterally,  Rotation to 45 deg bilaterally  SLR laying: Negative  XSLR laying: Negative  Palpable tenderness: Left thoracolumbar paraspinal muscles.  Pain slightly out of proportion to degree of palpation. FABER: negative. Sensory change: Gross sensation intact to all lumbar and sacral dermatomes.  Reflexes: 2+ at both patellar tendons, 2+ at achilles tendons, Babinski's downgoing.  Strength at foot  Plantar-flexion: 5/5 Dorsi-flexion: 5/5 Eversion: 5/5 Inversion: 5/5  Leg strength  Quad: 5/5 Hamstring: 5/5 Hip flexor: 5/5 Hip abductors: 5/5  Gait unremarkable.  Impression and Recommendations:    Degenerative disc disease, lumbar Acute mid low back pain. Likely discogenic, started conservatively, prednisone, Robaxin, formal PT. Thoracic and lumbar spine x-rays as the pain is near the thoracolumbar junction. She does have significant pain out of proportion to degree of palpation suggesting a myofascial/fibromyalgia component as well. Return to see me in 4 to 6 weeks, thoracolumbar MRI if no better. ___________________________________________ Gwen Her. [name], M.D., ABFM., CAQSM. Primary Care and Sports Medicine [facility]  Adjunct Professor of [employer] [facility] [employer]

## 2019-02-10 ENCOUNTER — Ambulatory Visit: Payer: Self-pay | Admitting: Physical Therapy

## 2019-02-17 ENCOUNTER — Encounter (HOSPITAL_COMMUNITY): Payer: Self-pay | Admitting: Psychiatry

## 2019-02-17 ENCOUNTER — Ambulatory Visit (INDEPENDENT_AMBULATORY_CARE_PROVIDER_SITE_OTHER): Admitting: Psychiatry

## 2019-02-17 ENCOUNTER — Other Ambulatory Visit: Payer: Self-pay

## 2019-02-17 VITALS — BP 130/82 | HR 81 | Ht 67.0 in | Wt 200.0 lb

## 2019-02-17 DIAGNOSIS — F5102 Adjustment insomnia: Secondary | ICD-10-CM | POA: Diagnosis not present

## 2019-02-17 DIAGNOSIS — F339 Major depressive disorder, recurrent, unspecified: Secondary | ICD-10-CM

## 2019-02-17 DIAGNOSIS — F411 Generalized anxiety disorder: Secondary | ICD-10-CM | POA: Diagnosis not present

## 2019-02-17 NOTE — Progress Notes (Signed)
[facility] Outpatient Follow up visit   Patient Identification: CORNEISHA ALVI MRN:  [id number] Date of Evaluation:  02/17/2019 Referral Source: 53  years old currently married Caucasian female initially referred by primary care office for management of depression and anxiety  Chief Complaint:   Chief Complaint    Follow-up; Other     Visit Diagnosis:    ICD-10-CM   1. Major depression, recurrent, chronic (HCC) F33.9   2. GAD (generalized anxiety disorder) F41.1   3. 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 Winter was fair. Wants to lower or dc lexapro as stressors are stable. Discussed that there is   Wanted to consider wellbutrin or lexapro discuss this visit again.  Aggravating factors;country living. Relationship. Son in college Modifying factors; keeps busy. Severity of depression; worse Sleep problems without [name], now gets from [place] care  Duration 4 years  Past Psychiatric History: depression, anxiety   Previous Psychotropic Medications: Yes  VIIbryd made her agitated.  zoloft didn't help later Substance Abuse History in the last 12 months:  No.  Consequences of Substance Abuse: NA  Past Medical History:  Past Medical History:  Diagnosis Date  . Allergy   . Chicken pox   . Depression   . Hypertension   . Incontinence of urine   . UTI (lower urinary tract infection)     Past Surgical History:  Procedure Laterality Date  . ABLATION    . CERVICAL FUSION  2012  . pelvic laproscopic surgeries     4 prior to 2008    Family Psychiatric History: Dad; alcohol use in past. Depression. MOm side has depression and some bipolar in family  Family History:  Family History  Problem Relation Age of Onset  . Hyperlipidemia Mother   . Ulcerative colitis Mother   . Heart disease Father 26       CAD  . Hypertension Father   . Diverticulitis Father   . Hypertension Brother   . Cancer Maternal Grandfather 68       Stomach Cancer  .  Cancer Paternal Grandmother 1       Uterine Cancer  . Stroke Paternal Grandfather 39       CVA  . Cancer Maternal Grandmother     Social History:   Social History   Socioeconomic History  . Marital status: Married    Spouse name: Korie Streat  . Number of children: 1  . Years of education: 32  . Highest education level: Not on file  Occupational History  . Occupation: Print production planner for [employer]: [employer]  Social Needs  . Financial resource strain: Not on file  . Food insecurity:    Worry: Not on file    Inability: Not on file  . Transportation needs:    Medical: Not on file    Non-medical: Not on file  Tobacco Use  . Smoking status: Never Smoker  . Smokeless tobacco: Never Used  Substance and Sexual Activity  . Alcohol use: No    Alcohol/week: 2.0 standard drinks    Types: 2 Cans of beer per week    Comment: Occasionally   . Drug use: No  . Sexual activity: Yes    Birth control/protection: None  Lifestyle  . Physical activity:    Days per week: Not on file    Minutes per session: Not on file  . Stress: Not on file  Relationships  .  Social connections:    Talks on phone: Not on file    Gets together: Not on file    Attends religious service: Not on file    Active member of club or organization: Not on file    Attends meetings of clubs or organizations: Not on file    Relationship status: Not on file  Other Topics Concern  . Not on file  Social History Narrative  . Not on file     Allergies:   Allergies  Allergen Reactions  . Promethazine Anaphylaxis and Other (See Comments)    Other Reaction: OTHER REACTION  . Phenergan [Promethazine Hcl] Rash    Metabolic Disorder Labs: Lab Results  Component Value Date   HGBA1C 5.8 (H) 10/29/2018   MPG 120 10/29/2018   No results found for: PROLACTIN Lab Results  Component Value Date   CHOL 271 (H) 10/29/2018   TRIG 255 (H) 10/29/2018   HDL 56 10/29/2018   CHOLHDL  4.8 10/29/2018   VLDL 37 (H) 12/22/2015   LDLCALC 173 (H) 10/29/2018   LDLCALC 161 (H) 12/22/2015     Current Medications: Current Outpatient Medications  Medication Sig Dispense Refill  . amLODipine (NORVASC) 10 MG tablet Take 1 tablet (10 mg total) by mouth daily. 30 tablet 3  . atorvastatin (LIPITOR) 10 MG tablet Take 1 tablet (10 mg total) by mouth daily. 90 tablet 1  . escitalopram (LEXAPRO) 20 MG tablet Take 1 tablet (20 mg total) by mouth daily. Dose changed to 20mg . Delete prior refills 30 tablet 2  . ibuprofen (ADVIL,MOTRIN) 200 MG tablet Take 600 mg by mouth every 6 (six) hours as needed for pain.    . methocarbamol (ROBAXIN) 500 MG tablet Take 1 tablet (500 mg total) by mouth 3 (three) times daily. 90 tablet 0  . zolpidem (AMBIEN) 10 MG tablet TAKE 1 TABLET(10 MG) BY MOUTH AT BEDTIME 30 tablet 5  . metFORMIN (GLUCOPHAGE) 1000 MG tablet Take 1 tablet (1,000 mg total) by mouth 2 (two) times daily with a meal. (Patient not taking: Reported on 02/17/2019) 60 tablet 2  . predniSONE (DELTASONE) 50 MG tablet One tab PO daily for 5 days. (Patient not taking: Reported on 02/17/2019) 5 tablet 0  . Red Yeast Rice 600 MG CAPS Take by mouth.     No current facility-administered medications for this visit.       Psychiatric Specialty Exam: Review of Systems  Cardiovascular: Negative for chest pain and palpitations.  Skin: Negative for itching.  Neurological: Negative for tremors.  Psychiatric/Behavioral: Negative for depression and suicidal ideas.    Blood pressure 130/82, pulse 81, height 5\' 7"  (1.702 m), weight 200 lb (90.7 kg).Body mass index is 31.32 kg/m.  General Appearance: Neat  Eye Contact:  Fair  Speech:  Normal Rate  Volume:  Normal  Mood:stable  Affect:  constricted  Thought Process:  Goal Directed  Orientation:  Full (Time, Place, and Person)  Thought Content:  Rumination  Suicidal Thoughts:  No  Homicidal Thoughts:  No  Memory:  Immediate;   Fair Recent;   Fair   Judgement:  Fair  Insight:  Fair  Psychomotor Activity:  Normal  Concentration:  Concentration: Fair and Attention Span: Fair  Recall:  [employer] of Knowledge:Fair  Language: Fair  Akathisia:  Negative  Handed:  Right  AIMS (if indicated):    Assets:  Desire for Improvement  ADL's:  Intact  Cognition: WNL  Sleep:  Fair with meds    Treatment Plan  Summary: Medication management and Plan as follows  1. Major depression, recurrent , moderate: doing fair. She wants to be off lexapro and see how it goes, understands the rirs, says will tell husband to keep under observation as well Discussed meds, can lower to 10mg  for next 10 days then 5mg  for the other 10 days prior stopping 2. FPB:[id number]. Not worse. , she wants to lower off lexapro as above  3. Insomnia: now on ambien , it helps. Reviewed sleep hygiene  Fur 78m or earlier if needed. Cautioned of recurrence and to be awars   Merian Capron, MD 2/24/20202:22 PM

## 2019-02-17 NOTE — Patient Instructions (Signed)
Fu 2-51m.

## 2019-03-19 ENCOUNTER — Ambulatory Visit: Payer: Self-pay | Admitting: Sports Medicine

## 2019-03-26 ENCOUNTER — Ambulatory Visit: Payer: Self-pay | Admitting: Sports Medicine

## 2019-04-14 ENCOUNTER — Ambulatory Visit (HOSPITAL_COMMUNITY): Admitting: Psychiatry

## 2019-04-15 ENCOUNTER — Other Ambulatory Visit: Payer: Self-pay

## 2019-04-15 ENCOUNTER — Ambulatory Visit (INDEPENDENT_AMBULATORY_CARE_PROVIDER_SITE_OTHER): Admitting: Psychiatry

## 2019-04-15 ENCOUNTER — Encounter (HOSPITAL_COMMUNITY): Payer: Self-pay | Admitting: Psychiatry

## 2019-04-15 DIAGNOSIS — F411 Generalized anxiety disorder: Secondary | ICD-10-CM | POA: Diagnosis not present

## 2019-04-15 DIAGNOSIS — F5102 Adjustment insomnia: Secondary | ICD-10-CM | POA: Diagnosis not present

## 2019-04-15 DIAGNOSIS — F339 Major depressive disorder, recurrent, unspecified: Secondary | ICD-10-CM | POA: Diagnosis not present

## 2019-04-15 DIAGNOSIS — F419 Anxiety disorder, unspecified: Secondary | ICD-10-CM | POA: Diagnosis not present

## 2019-04-15 MED ORDER — ESCITALOPRAM OXALATE 20 MG PO TABS: 20.0000 mg | ORAL_TABLET | Freq: Every day | ORAL | 0 refills | Status: DC

## 2019-04-15 NOTE — Progress Notes (Signed)
[facility] Outpatient Follow up visit  Tele psych visit Patient Identification: Kayla Hood MRN:  [id number] Date of Evaluation:  04/15/2019 Referral Source: 53  years old currently married Caucasian female initially referred by primary care office for management of depression and anxiety  Chief Complaint:    Visit Diagnosis:    ICD-10-CM   1. Major depression, recurrent, chronic (HCC) F33.9   2. GAD (generalized anxiety disorder) F41.1   3. Adjustment insomnia F51.02   4. Anxiety F41.9     History of Present Illness:    I connected with Kayla Hood on 04/15/19 at  8:45 AM EDT by telephone and verified that I am speaking with the correct person using two identifiers.   I discussed the limitations, risks, security and privacy concerns of performing an evaluation and management service by telephone and the availability of in person appointments. I also discussed with the patient that there may be a patient responsible charge related to this service. The patient expressed understanding and agreed to proceed.  Wanted to stop lexapro, tapered down after last visit, as her choice, started noticing return of anger and anxiety, hast started back , some better but feel its helping and wants to continue and not stop  Aggravating factors;country living. Relationship. Son in college Modifying factors; keeps busy. Severity of depression; has gone anxious Sleep problems without [name], now gets from [place] care  Duration 4 years  Past Psychiatric History: depression, anxiety   Previous Psychotropic Medications: Yes  VIIbryd made her agitated.  zoloft didn't help later Substance Abuse History in the last 12 months:  No.  Consequences of Substance Abuse: NA  Past Medical History:  Past Medical History:  Diagnosis Date  . Allergy   . Chicken pox   . Depression   . Hypertension   . Incontinence of urine   . UTI (lower urinary tract infection)     Past Surgical History:  Procedure  Laterality Date  . ABLATION    . CERVICAL FUSION  2012  . pelvic laproscopic surgeries     4 prior to 2008    Family Psychiatric History: Dad; alcohol use in past. Depression. MOm side has depression and some bipolar in family  Family History:  Family History  Problem Relation Age of Onset  . Hyperlipidemia Mother   . Ulcerative colitis Mother   . Heart disease Father 43       CAD  . Hypertension Father   . Diverticulitis Father   . Hypertension Brother   . Cancer Maternal Grandfather 43       Stomach Cancer  . Cancer Paternal Grandmother 30       Uterine Cancer  . Stroke Paternal Grandfather 38       CVA  . Cancer Maternal Grandmother     Social History:   Social History   Socioeconomic History  . Marital status: Married    Spouse name: Ivyonna Hoelzel  . Number of children: 1  . Years of education: 54  . Highest education level: Not on file  Occupational History  . Occupation: Print production planner for [employer]: [employer]  Social Needs  . Financial resource strain: Not on file  . Food insecurity:    Worry: Not on file    Inability: Not on file  . Transportation needs:    Medical: Not on file    Non-medical: Not on file  Tobacco Use  . Smoking status: Never Smoker  .  Smokeless tobacco: Never Used  Substance and Sexual Activity  . Alcohol use: No    Alcohol/week: 2.0 standard drinks    Types: 2 Cans of beer per week    Comment: Occasionally   . Drug use: No  . Sexual activity: Yes    Birth control/protection: None  Lifestyle  . Physical activity:    Days per week: Not on file    Minutes per session: Not on file  . Stress: Not on file  Relationships  . Social connections:    Talks on phone: Not on file    Gets together: Not on file    Attends religious service: Not on file    Active member of club or organization: Not on file    Attends meetings of clubs or organizations: Not on file    Relationship status: Not on file   Other Topics Concern  . Not on file  Social History Narrative  . Not on file     Allergies:   Allergies  Allergen Reactions  . Promethazine Anaphylaxis and Other (See Comments)    Other Reaction: OTHER REACTION  . Phenergan [Promethazine Hcl] Rash    Metabolic Disorder Labs: Lab Results  Component Value Date   HGBA1C 5.8 (H) 10/29/2018   MPG 120 10/29/2018   No results found for: PROLACTIN Lab Results  Component Value Date   CHOL 271 (H) 10/29/2018   TRIG 255 (H) 10/29/2018   HDL 56 10/29/2018   CHOLHDL 4.8 10/29/2018   VLDL 37 (H) 12/22/2015   LDLCALC 173 (H) 10/29/2018   LDLCALC 161 (H) 12/22/2015     Current Medications: Current Outpatient Medications  Medication Sig Dispense Refill  . amLODipine (NORVASC) 10 MG tablet Take 1 tablet (10 mg total) by mouth daily. 30 tablet 3  . atorvastatin (LIPITOR) 10 MG tablet Take 1 tablet (10 mg total) by mouth daily. 90 tablet 1  . escitalopram (LEXAPRO) 20 MG tablet Take 1 tablet (20 mg total) by mouth daily. Dose changed to 20mg . Delete prior refills 30 tablet 0  . ibuprofen (ADVIL,MOTRIN) 200 MG tablet Take 600 mg by mouth every 6 (six) hours as needed for pain.    . metFORMIN (GLUCOPHAGE) 1000 MG tablet Take 1 tablet (1,000 mg total) by mouth 2 (two) times daily with a meal. (Patient not taking: Reported on 02/17/2019) 60 tablet 2  . methocarbamol (ROBAXIN) 500 MG tablet Take 1 tablet (500 mg total) by mouth 3 (three) times daily. 90 tablet 0  . predniSONE (DELTASONE) 50 MG tablet One tab PO daily for 5 days. (Patient not taking: Reported on 02/17/2019) 5 tablet 0  . Red Yeast Rice 600 MG CAPS Take by mouth.    . zolpidem (AMBIEN) 10 MG tablet TAKE 1 TABLET(10 MG) BY MOUTH AT BEDTIME 30 tablet 5   No current facility-administered medications for this visit.       Psychiatric Specialty Exam: Review of Systems  Cardiovascular: Negative for chest pain and palpitations.  Skin: Negative for itching.  Neurological:  Negative for tremors.  Psychiatric/Behavioral: Negative for depression and suicidal ideas.    There were no vitals taken for this visit.There is no height or weight on file to calculate BMI.  General Appearance: Neat  Eye Contact:  Somewhat subdued  Speech:  Normal Rate  Volume:  Normal  Mood:stable  Affect:  constricted  Thought Process:  Goal Directed  Orientation:  Full (Time, Place, and Person)  Thought Content:  Rumination  Suicidal Thoughts:  No  Homicidal Thoughts:  No  Memory:  Immediate;   Fair Recent;   Fair  Judgement:  Fair  Insight:  Fair  Psychomotor Activity:  Normal  Concentration:  Concentration: Fair and Attention Span: Fair  Recall:  [employer] of Knowledge:Fair  Language: Fair  Akathisia:  Negative  Handed:  Right  AIMS (if indicated):    Assets:  Desire for Improvement  ADL's:  Intact  Cognition: WNL  Sleep:  Fair with meds    Treatment Plan Summary: Medication management and Plan as follows  1. Major depression, recurrent , moderate: didn't do well without lexparo, has started back and doing some better Will continue  2. RZN:[id number]. Now better back on lexapro  3. Insomnia: now on ambien , it helps. Reviewed sleep hygiene  Fur 28m or earlier if needed.  The patient was advised to call back or seek an in-person evaluation if the symptoms worsen or if the condition fails to improve as anticipated.  I provided 15 minutes of non-face-to-face time during this encounter. Merian Capron, MD 4/21/20208:55 AM

## 2019-04-17 ENCOUNTER — Encounter: Payer: Self-pay | Admitting: Physician Assistant

## 2019-04-17 MED ORDER — LEVOCETIRIZINE DIHYDROCHLORIDE 5 MG PO TABS: 5.0000 mg | ORAL_TABLET | Freq: Every evening | ORAL | 3 refills | Status: DC

## 2019-04-17 MED ORDER — FLUTICASONE PROPIONATE 50 MCG/ACT NA SUSP: 2.0000 | Freq: Every day | NASAL | 6 refills | Status: DC

## 2019-05-03 ENCOUNTER — Other Ambulatory Visit: Payer: Self-pay | Admitting: Physician Assistant

## 2019-05-03 DIAGNOSIS — F5101 Primary insomnia: Secondary | ICD-10-CM

## 2019-05-05 ENCOUNTER — Encounter: Payer: Self-pay | Admitting: Physician Assistant

## 2019-05-05 DIAGNOSIS — F5101 Primary insomnia: Secondary | ICD-10-CM

## 2019-05-05 MED ORDER — ZOLPIDEM TARTRATE 10 MG PO TABS: ORAL_TABLET | ORAL | 5 refills | Status: DC

## 2019-05-08 ENCOUNTER — Encounter: Payer: Self-pay | Admitting: Sports Medicine

## 2019-05-08 ENCOUNTER — Ambulatory Visit (INDEPENDENT_AMBULATORY_CARE_PROVIDER_SITE_OTHER)

## 2019-05-08 ENCOUNTER — Other Ambulatory Visit: Payer: Self-pay

## 2019-05-08 ENCOUNTER — Ambulatory Visit (INDEPENDENT_AMBULATORY_CARE_PROVIDER_SITE_OTHER): Admitting: Sports Medicine

## 2019-05-08 DIAGNOSIS — M1612 Unilateral primary osteoarthritis, left hip: Secondary | ICD-10-CM | POA: Diagnosis not present

## 2019-05-08 HISTORY — DX: Unilateral primary osteoarthritis, left hip: M16.12

## 2019-05-08 MED ORDER — TRAMADOL HCL 50 MG PO TABS: 50.0000 mg | ORAL_TABLET | Freq: Three times a day (TID) | ORAL | 0 refills | Status: DC | PRN

## 2019-05-08 NOTE — Progress Notes (Signed)
Subjective:    CC: Left hip pain  HPI: For the past several weeks this pleasant 53 year old female has had pain that she localizes over the anterolateral aspect of her left hip, severe, persistent, localized without radiation.  She gets significant gelling when getting up from a seated position, no trauma, no mechanical symptoms.  She does have a 17 mile downhill bike ride scheduled for tomorrow.  Denies any back pain, no radicular symptoms, no constitutional symptoms.  I reviewed the past medical history, family history, social history, surgical history, and allergies today and no changes were needed.  Please see the problem list section below in epic for further details.  Past Medical History: Past Medical History:  Diagnosis Date  . Allergy   . Chicken pox   . Depression   . Hypertension   . Incontinence of urine   . UTI (lower urinary tract infection)    Past Surgical History: Past Surgical History:  Procedure Laterality Date  . ABLATION    . CERVICAL FUSION  2012  . pelvic laproscopic surgeries     4 prior to 2008   Social History: Social History   Socioeconomic History  . Marital status: Married    Spouse name: Janeisha Ryle  . Number of children: 1  . Years of education: 62  . Highest education level: Not on file  Occupational History  . 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 Financial resource strain: Not on file  . Food insecurity:    Worry: Not on file    Inability: Not on file  . Transportation needs:    Medical: Not on file    Non-medical: Not on file  Tobacco Use  . Smoking status: Never Smoker  . Smokeless tobacco: Never Used  Substance and Sexual Activity  . Alcohol use: No    Alcohol/week: 2.0 standard drinks    Types: 2 Cans of beer per week    Comment: Occasionally   . Drug use: No  . Sexual activity: Yes    Birth control/protection: None  Lifestyle  . Physical activity:     Days per week: Not on file    Minutes per session: Not on file  . Stress: Not on file  Relationships  . Social connections:    Talks on phone: Not on file    Gets together: Not on file    Attends religious service: Not on file    Active member of club or organization: Not on file    Attends meetings of clubs or organizations: Not on file    Relationship status: Not on file  Other Topics Concern  . Not on file  Social History Narrative  . Not on file   Family History: Family History  Problem Relation Age of Onset  . Hyperlipidemia Mother   . Ulcerative colitis Mother   . Heart disease Father 80       CAD  . Hypertension Father   . Diverticulitis Father   . Hypertension Brother   . Cancer Maternal Grandfather 30       Stomach Cancer  . Cancer Paternal Grandmother 75       Uterine Cancer  . Stroke Paternal Grandfather 73       CVA  . Cancer Maternal Grandmother    Allergies: Allergies  Allergen Reactions  . Promethazine Anaphylaxis and Other (See Comments)    Other Reaction: OTHER REACTION  . Phenergan [Promethazine  Hcl] Rash   Medications: See med rec.  Review of Systems: No fevers, chills, night sweats, weight loss, chest pain, or shortness of breath.   Objective:    General: Well Developed, well nourished, and in no acute distress.  Neuro: Alert and oriented x3, extra-ocular muscles intact, sensation grossly intact.  HEENT: Normocephalic, atraumatic, pupils equal round reactive to light, neck supple, no masses, no lymphadenopathy, thyroid nonpalpable.  Skin: Warm and dry, no rashes. Cardiac: Regular rate and rhythm, no murmurs rubs or gallops, no lower extremity edema.  Respiratory: Clear to auscultation bilaterally. Not using accessory muscles, speaking in full sentences. Left hip: ROM IR: 60 Deg, reproduces pain, ER: 60 Deg, Flexion: 120 Deg, Extension: 100 Deg, Abduction: 45 Deg, Adduction: 45 Deg Strength IR: 5/5, ER: 5/5, Flexion: 5/5, Extension: 5/5,  Abduction: 5/5, Adduction: 5/5 Pelvic alignment unremarkable to inspection and palpation. Standing hip rotation and gait without trendelenburg / unsteadiness. Greater trochanter with mild tenderness to palpation. No tenderness over piriformis. No SI joint tenderness and normal minimal SI movement.  Procedure: Real-time Ultrasound Guided injection of the left femoroacetabular joint Device: GE Logiq E  Verbal informed consent obtained.  Time-out conducted.  Noted no overlying erythema, induration, or other signs of local infection.  Skin prepped in a sterile fashion.  Local anesthesia: Topical Ethyl chloride.  With sterile technique and under real time ultrasound guidance:  22-gauge spinal needle advanced to the femoral head/neck junction, contacted bone, I then injected 1 cc Kenalog 40, 2 cc lidocaine, 2 cc bupivacaine, there was a mild hip joint effusion. Completed without difficulty  Pain immediately resolved suggesting accurate placement of the medication.  Advised to call if fevers/chills, erythema, induration, drainage, or persistent bleeding.  Images permanently stored and available for review in the ultrasound unit.  Impression: Technically successful ultrasound guided injection.  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  There is a bit of flattening of the femoral head as well.  Impression and Recommendations:    Primary osteoarthritis of left hip Failed conservative measures, injection as above. X-rays, tramadol for breakthrough pain. Rehab exercises given. She did have some lateral pain as well consistent with trochanteric bursitis, we will address this in the future.   ___________________________________________ Gwen Her. [name], M.D., ABFM., CAQSM. Primary Care and Sports Medicine [facility]  Adjunct  Professor of [place] of [facility] of Medicine

## 2019-05-08 NOTE — Assessment & Plan Note (Signed)
Failed conservative measures, injection as above. X-rays, tramadol for breakthrough pain. Rehab exercises given. She did have some lateral pain as well consistent with trochanteric bursitis, we will address this in the future.

## 2019-06-11 ENCOUNTER — Encounter: Payer: Self-pay | Admitting: Sports Medicine

## 2019-06-11 ENCOUNTER — Ambulatory Visit (INDEPENDENT_AMBULATORY_CARE_PROVIDER_SITE_OTHER): Admitting: Sports Medicine

## 2019-06-11 DIAGNOSIS — M1612 Unilateral primary osteoarthritis, left hip: Secondary | ICD-10-CM

## 2019-06-11 NOTE — Assessment & Plan Note (Signed)
Tache has 2 pain generators, the hip joint on the greater trochanteric bursa. We injected the left hip joint at the last visit, and her groin pain has completely resolved. She still has some lateral pain referrable to the bursa, hip abductors are weak on that side so we will work on aggressive rehab. Return to see me in 6 weeks, if good return of strength but persistent discomfort then we can discuss injection.

## 2019-06-11 NOTE — Patient Instructions (Signed)
Hip Rehabilitation Protocol:  1.  Side leg raises.  3x30 with no weight, then 3x15 with 2 lb ankle weight, then 3x15 with 5 lb ankle weight 2.  Standing hip rotation.  3x30 with no weight, then 3x15 with 2 lb ankle weight, then 3x15 with 5 lb ankle weight. 3.  Side step ups.  3x30 with no weight, then 3x15 with 5 lbs in backpack, then 3x15 with 10 lbs in backpack. 

## 2019-06-11 NOTE — Progress Notes (Signed)
Subjective:    CC: Follow-up hip pain  HPI: This is a pleasant 53 year old female, I have seen her for multifactorial hip pain.  Pain is referrable to both the joint and the greater trochanteric bursa, after failure of conservative measures I injected her hip joint at the last visit, her groin pain has completely resolved.  She was able to do a 19 mile downhill bike ride without discomfort.  Unfortunately she continues to have pain laterally over the greater trochanteric bursa but if not bad enough to consider an injection.  I reviewed the past medical history, family history, social history, surgical history, and allergies today and no changes were needed.  Please see the problem list section below in epic for further details.  Past Medical History: Past Medical History:  Diagnosis Date  . Allergy   . Chicken pox   . Depression   . Hypertension   . Incontinence of urine   . UTI (lower urinary tract infection)    Past Surgical History: Past Surgical History:  Procedure Laterality Date  . ABLATION    . CERVICAL FUSION  2012  . pelvic laproscopic surgeries     4 prior to 2008   Social History: Social History   Socioeconomic History  . Marital status: Married    Spouse name: Nigeria Lasseter  . Number of children: 1  . Years of education: 77  . Highest education level: Not on file  Occupational History  . 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 Financial resource strain: Not on file  . Food insecurity    Worry: Not on file    Inability: Not on file  . Transportation needs    Medical: Not on file    Non-medical: Not on file  Tobacco Use  . Smoking status: Never Smoker  . Smokeless tobacco: Never Used  Substance and Sexual Activity  . Alcohol use: No    Alcohol/week: 2.0 standard drinks    Types: 2 Cans of beer per week    Comment: Occasionally   . Drug use: No  . Sexual activity: Yes    Birth  control/protection: None  Lifestyle  . Physical activity    Days per week: Not on file    Minutes per session: Not on file  . Stress: Not on file  Relationships  . Social Herbalist on phone: Not on file    Gets together: Not on file    Attends religious service: Not on file    Active member of club or organization: Not on file    Attends meetings of clubs or organizations: Not on file    Relationship status: Not on file  Other Topics Concern  . Not on file  Social History Narrative  . Not on file   Family History: Family History  Problem Relation Age of Onset  . Hyperlipidemia Mother   . Ulcerative colitis Mother   . Heart disease Father 53       CAD  . Hypertension Father   . Diverticulitis Father   . Hypertension Brother   . Cancer Maternal Grandfather 61       Stomach Cancer  . Cancer Paternal Grandmother 42       Uterine Cancer  . Stroke Paternal Grandfather 44       CVA  . Cancer Maternal Grandmother    Allergies: Allergies  Allergen Reactions  . Promethazine Anaphylaxis and  Other (See Comments)    Other Reaction: OTHER REACTION  . Phenergan [Promethazine Hcl] Rash   Medications: See med rec.  Review of Systems: No fevers, chills, night sweats, weight loss, chest pain, or shortness of breath.   Objective:    General: Well Developed, well nourished, and in no acute distress.  Neuro: Alert and oriented x3, extra-ocular muscles intact, sensation grossly intact.  HEENT: Normocephalic, atraumatic, pupils equal round reactive to light, neck supple, no masses, no lymphadenopathy, thyroid nonpalpable.  Skin: Warm and dry, no rashes. Cardiac: Regular rate and rhythm, no murmurs rubs or gallops, no lower extremity edema.  Respiratory: Clear to auscultation bilaterally. Not using accessory muscles, speaking in full sentences.  Impression and Recommendations:    Primary osteoarthritis of left hip Najia has 2 pain generators, the hip joint on the greater  trochanteric bursa. We injected the left hip joint at the last visit, and her groin pain has completely resolved. She still has some lateral pain referrable to the bursa, hip abductors are weak on that side so we will work on aggressive rehab. Return to see me in 6 weeks, if good return of strength but persistent discomfort then we can discuss injection.   ___________________________________________ Gwen Her. [name], M.D., ABFM., CAQSM. Primary Care and Sports Medicine [facility]  Adjunct Professor of [place] of [facility] of Medicine

## 2019-06-17 ENCOUNTER — Encounter (HOSPITAL_COMMUNITY): Payer: Self-pay | Admitting: Psychiatry

## 2019-06-17 ENCOUNTER — Ambulatory Visit (INDEPENDENT_AMBULATORY_CARE_PROVIDER_SITE_OTHER): Admitting: Psychiatry

## 2019-06-17 ENCOUNTER — Ambulatory Visit (HOSPITAL_COMMUNITY): Admitting: Psychiatry

## 2019-06-17 DIAGNOSIS — F339 Major depressive disorder, recurrent, unspecified: Secondary | ICD-10-CM

## 2019-06-17 DIAGNOSIS — F5102 Adjustment insomnia: Secondary | ICD-10-CM | POA: Diagnosis not present

## 2019-06-17 DIAGNOSIS — F411 Generalized anxiety disorder: Secondary | ICD-10-CM

## 2019-06-17 DIAGNOSIS — F419 Anxiety disorder, unspecified: Secondary | ICD-10-CM

## 2019-06-17 MED ORDER — ESCITALOPRAM OXALATE 20 MG PO TABS: 20.0000 mg | ORAL_TABLET | Freq: Every day | ORAL | 2 refills | Status: DC

## 2019-06-17 NOTE — Progress Notes (Signed)
[facility] Outpatient Follow up visit  Tele psych visit Patient Identification: Kayla Hood MRN:  [id number] Date of Evaluation:  06/17/2019 Referral Source: 53  years old currently married Caucasian female initially referred by primary care office for management of depression and anxiety  Chief Complaint:   depression follow up  Visit Diagnosis:    ICD-10-CM   1. Major depression, recurrent, chronic (HCC)  F33.9   2. GAD (generalized anxiety disorder)  F41.1   3. Adjustment insomnia  F51.02   4. Anxiety  F41.9     History of Present Illness:     I connected with Kayla Hood on 06/17/19 at 10:00 AM EDT by telephone and verified that I am speaking with the correct person using two identifiers.   I discussed the limitations, risks, security and privacy concerns of performing an evaluation and management service by telephone and the availability of in person appointments. I also discussed with the patient that there may be a patient responsible charge related to this service. The patient expressed understanding and agreed to proceed.  Doing better after restart back on lexapro. Depression better. Still avoids crowds or closed spaces  Aggravating factors;country living. Relationship. Son in college Modifying factors; keeps busy.  Severity of depression;better Sleep problems without [name], now gets from [place] care  Duration 4 years  Past Psychiatric History: depression, anxiety   Previous Psychotropic Medications: Yes  VIIbryd made her agitated.  zoloft didn't help later Substance Abuse History in the last 12 months:  No.  Consequences of Substance Abuse: NA  Past Medical History:  Past Medical History:  Diagnosis Date  . Allergy   . Chicken pox   . Depression   . Hypertension   . Incontinence of urine   . UTI (lower urinary tract infection)     Past Surgical History:  Procedure Laterality Date  . ABLATION    . CERVICAL FUSION  2012  . pelvic laproscopic surgeries      4 prior to 2008    Family Psychiatric History: Dad; alcohol use in past. Depression. MOm side has depression and some bipolar in family  Family History:  Family History  Problem Relation Age of Onset  . Hyperlipidemia Mother   . Ulcerative colitis Mother   . Heart disease Father 6       CAD  . Hypertension Father   . Diverticulitis Father   . Hypertension Brother   . Cancer Maternal Grandfather 16       Stomach Cancer  . Cancer Paternal Grandmother 54       Uterine Cancer  . Stroke Paternal Grandfather 40       CVA  . Cancer Maternal Grandmother     Social History:   Social History   Socioeconomic History  . Marital status: Married    Spouse name: Azyiah Bo  . Number of children: 1  . Years of education: 42  . Highest education level: Not on file  Occupational History  . 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 Financial resource strain: Not on file  . Food insecurity    Worry: Not on file    Inability: Not on file  . Transportation needs    Medical: Not on file    Non-medical: Not on file  Tobacco Use  . Smoking status: Never Smoker  . Smokeless tobacco: Never Used  Substance and Sexual Activity  . Alcohol use: No  Alcohol/week: 2.0 standard drinks    Types: 2 Cans of beer per week    Comment: Occasionally   . Drug use: No  . Sexual activity: Yes    Birth control/protection: None  Lifestyle  . Physical activity    Days per week: Not on file    Minutes per session: Not on file  . Stress: Not on file  Relationships  . Social Herbalist on phone: Not on file    Gets together: Not on file    Attends religious service: Not on file    Active member of club or organization: Not on file    Attends meetings of clubs or organizations: Not on file    Relationship status: Not on file  Other Topics Concern  . Not on file  Social History Narrative  . Not on file      Allergies:   Allergies  Allergen Reactions  . Promethazine Anaphylaxis and Other (See Comments)    Other Reaction: OTHER REACTION  . Phenergan [Promethazine Hcl] Rash    Metabolic Disorder Labs: Lab Results  Component Value Date   HGBA1C 5.8 (H) 10/29/2018   MPG 120 10/29/2018   No results found for: PROLACTIN Lab Results  Component Value Date   CHOL 271 (H) 10/29/2018   TRIG 255 (H) 10/29/2018   HDL 56 10/29/2018   CHOLHDL 4.8 10/29/2018   VLDL 37 (H) 12/22/2015   LDLCALC 173 (H) 10/29/2018   LDLCALC 161 (H) 12/22/2015     Current Medications: Current Outpatient Medications  Medication Sig Dispense Refill  . amLODipine (NORVASC) 10 MG tablet Take 1 tablet (10 mg total) by mouth daily. 30 tablet 3  . atorvastatin (LIPITOR) 10 MG tablet Take 1 tablet (10 mg total) by mouth daily. 90 tablet 1  . escitalopram (LEXAPRO) 20 MG tablet Take 1 tablet (20 mg total) by mouth daily. One a day 30 tablet 2  . fluticasone (FLONASE) 50 MCG/ACT nasal spray Place 2 sprays into both nostrils daily. 16 g 6  . ibuprofen (ADVIL,MOTRIN) 200 MG tablet Take 600 mg by mouth every 6 (six) hours as needed for pain.    Marland Kitchen levocetirizine (XYZAL) 5 MG tablet Take 1 tablet (5 mg total) by mouth every evening. 90 tablet 3  . metFORMIN (GLUCOPHAGE) 1000 MG tablet Take 1 tablet (1,000 mg total) by mouth 2 (two) times daily with a meal. 60 tablet 2  . Red Yeast Rice 600 MG CAPS Take by mouth.    . traMADol (ULTRAM) 50 MG tablet Take 1 tablet (50 mg total) by mouth every 8 (eight) hours as needed for moderate pain. Maximum 6 tabs per day. 21 tablet 0  . zolpidem (AMBIEN) 10 MG tablet TAKE 1 TABLET(10 MG) BY MOUTH AT BEDTIME 30 tablet 5   No current facility-administered medications for this visit.       Psychiatric Specialty Exam: Review of Systems  Cardiovascular: Negative for chest pain and palpitations.  Skin: Negative for itching.  Psychiatric/Behavioral: Negative for depression and suicidal  ideas.    There were no vitals taken for this visit.There is no height or weight on file to calculate BMI.  General Appearance:   Eye Contact:    Speech:  Normal Rate  Volume:  Normal  Mood:fair  Affect:  constricted  Thought Process:  Goal Directed  Orientation:  Full (Time, Place, and Person)  Thought Content:  Rumination  Suicidal Thoughts:  No  Homicidal Thoughts:  No  Memory:  Immediate;  Fair Recent;   Fair  Judgement:  Fair  Insight:  Fair  Psychomotor Activity:  Normal  Concentration:  Concentration: Fair and Attention Span: Fair  Recall:  [employer] of Knowledge:Fair  Language: Fair  Akathisia:  Negative  Handed:  Right  AIMS (if indicated):    Assets:  Desire for Improvement  ADL's:  Intact  Cognition: WNL  Sleep:  Fair with meds    Treatment Plan Summary: Medication management and Plan as follows  1. Major depression, recurrent , moderate:fair on lexapro, will continue 2. SLH:[id number]. Still avoids crowds continue lexapro 3. Insomnia: now on ambien , it helps. Reviewed sleep hygiene  Fu 41m.  I discussed the assessment and treatment plan with the patient. The patient was provided an opportunity to ask questions and all were answered. The patient agreed with the plan and demonstrated an understanding of the instructions.   The patient was advised to call back or seek an in-person evaluation if the symptoms worsen or if the condition fails to improve as anticipated.  I provided 15 minutes of non-face-to-face time during this encounter. Merian Capron, MD 6/23/202010:09 AM

## 2019-06-30 ENCOUNTER — Other Ambulatory Visit: Payer: Self-pay | Admitting: Sports Medicine

## 2019-06-30 DIAGNOSIS — I10 Essential (primary) hypertension: Secondary | ICD-10-CM

## 2019-07-21 ENCOUNTER — Ambulatory Visit: Admitting: Sports Medicine

## 2019-08-13 ENCOUNTER — Other Ambulatory Visit: Payer: Self-pay

## 2019-08-13 ENCOUNTER — Encounter: Payer: Self-pay | Admitting: Physician Assistant

## 2019-08-13 ENCOUNTER — Ambulatory Visit (INDEPENDENT_AMBULATORY_CARE_PROVIDER_SITE_OTHER): Admitting: Physician Assistant

## 2019-08-13 VITALS — BP 155/85 | HR 99 | Temp 99.4°F | Ht 67.0 in | Wt 201.0 lb

## 2019-08-13 DIAGNOSIS — Z131 Encounter for screening for diabetes mellitus: Secondary | ICD-10-CM | POA: Diagnosis not present

## 2019-08-13 DIAGNOSIS — Z0000 Encounter for general adult medical examination without abnormal findings: Secondary | ICD-10-CM | POA: Diagnosis not present

## 2019-08-13 DIAGNOSIS — Z13220 Encounter for screening for lipoid disorders: Secondary | ICD-10-CM

## 2019-08-13 DIAGNOSIS — R432 Parageusia: Secondary | ICD-10-CM

## 2019-08-13 DIAGNOSIS — R7301 Impaired fasting glucose: Secondary | ICD-10-CM

## 2019-08-13 DIAGNOSIS — H60332 Swimmer's ear, left ear: Secondary | ICD-10-CM

## 2019-08-13 MED ORDER — OFLOXACIN 0.3 % OT SOLN: 10.0000 [drp] | Freq: Every day | OTIC | 0 refills | Status: DC

## 2019-08-13 NOTE — Patient Instructions (Signed)
Health Maintenance, Female Adopting a healthy lifestyle and getting preventive care are important in promoting health and wellness. Ask your health care provider about:  The right schedule for you to have regular tests and exams.  Things you can do on your own to prevent diseases and keep yourself healthy. What should I know about diet, weight, and exercise? Eat a healthy diet   Eat a diet that includes plenty of vegetables, fruits, low-fat dairy products, and lean protein.  Do not eat a lot of foods that are high in solid fats, added sugars, or sodium. Maintain a healthy weight Body mass index (BMI) is used to identify weight problems. It estimates body fat based on height and weight. Your health care provider can help determine your BMI and help you achieve or maintain a healthy weight. Get regular exercise Get regular exercise. This is one of the most important things you can do for your health. Most adults should:  Exercise for at least 150 minutes each week. The exercise should increase your heart rate and make you sweat (moderate-intensity exercise).  Do strengthening exercises at least twice a week. This is in addition to the moderate-intensity exercise.  Spend less time sitting. Even light physical activity can be beneficial. Watch cholesterol and blood lipids Have your blood tested for lipids and cholesterol at 51 years of age, then have this test every 5 years. Have your cholesterol levels checked more often if:  Your lipid or cholesterol levels are high.  You are older than 51 years of age.  You are at high risk for heart disease. What should I know about cancer screening? Depending on your health history and family history, you may need to have cancer screening at various ages. This may include screening for:  Breast cancer.  Cervical cancer.  Colorectal cancer.  Skin cancer.  Lung cancer. What should I know about heart disease, diabetes, and high blood  pressure? Blood pressure and heart disease  High blood pressure causes heart disease and increases the risk of stroke. This is more likely to develop in people who have high blood pressure readings, are of African descent, or are overweight.  Have your blood pressure checked: ? Every 3-5 years if you are 18-39 years of age. ? Every year if you are 40 years old or older. Diabetes Have regular diabetes screenings. This checks your fasting blood sugar level. Have the screening done:  Once every three years after age 40 if you are at a normal weight and have a low risk for diabetes.  More often and at a younger age if you are overweight or have a high risk for diabetes. What should I know about preventing infection? Hepatitis B If you have a higher risk for hepatitis B, you should be screened for this virus. Talk with your health care provider to find out if you are at risk for hepatitis B infection. Hepatitis C Testing is recommended for:  Everyone born from 1945 through 1965.  Anyone with known risk factors for hepatitis C. Sexually transmitted infections (STIs)  Get screened for STIs, including gonorrhea and chlamydia, if: ? You are sexually active and are younger than 51 years of age. ? You are older than 51 years of age and your health care provider tells you that you are at risk for this type of infection. ? Your sexual activity has changed since you were last screened, and you are at increased risk for chlamydia or gonorrhea. Ask your health care provider if   you are at risk.  Ask your health care provider about whether you are at high risk for HIV. Your health care provider may recommend a prescription medicine to help prevent HIV infection. If you choose to take medicine to prevent HIV, you should first get tested for HIV. You should then be tested every 3 months for as long as you are taking the medicine. Pregnancy  If you are about to stop having your period (premenopausal) and  you may become pregnant, seek counseling before you get pregnant.  Take 400 to 800 micrograms (mcg) of folic acid every day if you become pregnant.  Ask for birth control (contraception) if you want to prevent pregnancy. Osteoporosis and menopause Osteoporosis is a disease in which the bones lose minerals and strength with aging. This can result in bone fractures. If you are 65 years old or older, or if you are at risk for osteoporosis and fractures, ask your health care provider if you should:  Be screened for bone loss.  Take a calcium or vitamin D supplement to lower your risk of fractures.  Be given hormone replacement therapy (HRT) to treat symptoms of menopause. Follow these instructions at home: Lifestyle  Do not use any products that contain nicotine or tobacco, such as cigarettes, e-cigarettes, and chewing tobacco. If you need help quitting, ask your health care provider.  Do not use street drugs.  Do not share needles.  Ask your health care provider for help if you need support or information about quitting drugs. Alcohol use  Do not drink alcohol if: ? Your health care provider tells you not to drink. ? You are pregnant, may be pregnant, or are planning to become pregnant.  If you drink alcohol: ? Limit how much you use to 0-1 drink a day. ? Limit intake if you are breastfeeding.  Be aware of how much alcohol is in your drink. In the [place] one drink equals one 12 oz bottle of beer (355 mL), one 5 oz glass of wine (148 mL), or one 1 oz glass of hard liquor (44 mL). General instructions  Schedule regular health, dental, and eye exams.  Stay current with your vaccines.  Tell your health care provider if: ? You often feel depressed. ? You have ever been abused or do not feel safe at home. Summary  Adopting a healthy lifestyle and getting preventive care are important in promoting health and wellness.  Follow your health care provider's instructions about healthy  diet, exercising, and getting tested or screened for diseases.  Follow your health care provider's instructions on monitoring your cholesterol and blood pressure. This information is not intended to replace advice given to you by your health care provider. Make sure you discuss any questions you have with your health care provider. Document Released: 06/26/2011 Document Revised: 12/04/2018 Document Reviewed: 12/04/2018 Elsevier Patient Education  2020 Elsevier Inc.  

## 2019-08-13 NOTE — Progress Notes (Signed)
Subjective:     Kayla Hood is a 53 y.o. female and is here for a comprehensive physical exam. The patient reports problems - pt has had an abnormal sense of taste for weeks. she denies any GI symptoms, SOB, cough, URI symptoms. she did recently increase ambien and change tooth paste.  she also mentions her left ear has started to ache. She admits to swimming every morning for exercise. Not tried anything to make better.   Social History   Socioeconomic History  . Marital status: Married    Spouse name: Kayla Hood  . Number of children: 1  . Years of education: 94  . Highest education level: Not on file  Occupational History  . 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 Financial resource strain: Not on file  . Food insecurity    Worry: Not on file    Inability: Not on file  . Transportation needs    Medical: Not on file    Non-medical: Not on file  Tobacco Use  . Smoking status: Never Smoker  . Smokeless tobacco: Never Used  Substance and Sexual Activity  . Alcohol use: No    Alcohol/week: 2.0 standard drinks    Types: 2 Cans of beer per week    Comment: Occasionally   . Drug use: No  . Sexual activity: Yes    Birth control/protection: None  Lifestyle  . Physical activity    Days per week: Not on file    Minutes per session: Not on file  . Stress: Not on file  Relationships  . Social Herbalist on phone: Not on file    Gets together: Not on file    Attends religious service: Not on file    Active member of club or organization: Not on file    Attends meetings of clubs or organizations: Not on file    Relationship status: Not on file  . Intimate partner violence    Fear of current or ex partner: Not on file    Emotionally abused: Not on file    Physically abused: Not on file    Forced sexual activity: Not on file  Other Topics Concern  . Not on file  Social History Narrative  . Not on  file   Health Maintenance  Topic Date Due  . INFLUENZA VACCINE  03/24/2020 (Originally 07/26/2019)  . HIV Screening  03/13/2027 (Originally 11/13/1983)  . PAP SMEAR-Modifier  11/09/2019  . MAMMOGRAM  05/01/2020  . TETANUS/TDAP  02/23/2024  . COLONOSCOPY  08/12/2026    The following portions of the patient's history were reviewed and updated as appropriate: allergies, current medications, past family history, past medical history, past social history, past surgical history and problem list.  Review of Systems Pertinent items noted in HPI and remainder of comprehensive ROS otherwise negative.   Objective:    BP (!) 155/85   Pulse 99   Temp 99.4 F (37.4 C) (Oral)   Ht 5\' 7"  (1.702 m)   Wt 201 lb (91.2 kg)   SpO2 99%   BMI 31.48 kg/m  General appearance: alert, cooperative and appears stated age Head: Normocephalic, without obvious abnormality, atraumatic Eyes: conjunctivae/corneas clear. PERRL, EOM's intact. Fundi benign. Ears: normal TM and external ear canal right ear and abnormal TM left ear - erythematous and some purulent discharge whiteish present. some discomfort with pressing on tragus.  Nose: Nares normal. Septum  midline. Mucosa normal. No drainage or sinus tenderness. Throat: lips, mucosa, and tongue normal; teeth and gums normal Neck: no adenopathy, no carotid bruit, no JVD, supple, symmetrical, trachea midline and thyroid not enlarged, symmetric, no tenderness/mass/nodules Back: symmetric, no curvature. ROM normal. No CVA tenderness. Lungs: clear to auscultation bilaterally Heart: regular rate and rhythm, S1, S2 normal, no murmur, click, rub or gallop Abdomen: soft, non-tender; bowel sounds normal; no masses,  no organomegaly Extremities: extremities normal, atraumatic, no cyanosis or edema Pulses: 2+ and symmetric Skin: Skin color, texture, turgor normal. No rashes or lesions Lymph nodes: Cervical, supraclavicular, and axillary nodes normal. Neurologic: Alert and  oriented X 3, normal strength and tone. Normal symmetric reflexes. Normal coordination and gait    Assessment:    Healthy female exam.      Plan:    Marland KitchenMarland KitchenInas was seen today for annual exam.  Diagnoses and all orders for this visit:  Routine physical examination -     Lipid Panel w/reflex Direct LDL -     COMPLETE METABOLIC PANEL WITH GFR -     B12 and Folate Panel -     VITAMIN D 25 Hydroxy (Vit-D Deficiency, Fractures) -     CBC with Differential/Platelet -     TSH  Screening for diabetes mellitus -     COMPLETE METABOLIC PANEL WITH GFR  Screening for lipid disorders -     Lipid Panel w/reflex Direct LDL  Abnormal sense of taste -     B12 and Folate Panel -     VITAMIN D 25 Hydroxy (Vit-D Deficiency, Fractures) -     CBC with Differential/Platelet -     TSH  Acute swimmer's ear of left side -     ofloxacin (FLOXIN) 0.3 % OTIC solution; Place 10 drops into both ears daily. For 7 days.  Elevated fasting glucose -     Hemoglobin A1c  .Marland Kitchen Depression screen [facility] 2/9 08/13/2019 11/19/2018 03/27/2018 09/04/2017 09/03/2017  Decreased Interest 0 - 0 0 0  Down, Depressed, Hopeless 0 0 0 0 0  PHQ - 2 Score 0 0 0 0 0  Altered sleeping 1 0 - - -  Tired, decreased energy 1 0 - - -  Change in appetite 0 0 - - -  Feeling bad or failure about yourself  0 0 - - -  Trouble concentrating 0 0 - - -  Moving slowly or fidgety/restless 0 0 - - -  Suicidal thoughts 0 0 - - -  PHQ-9 Score 2 0 - - -  Difficult doing work/chores Not difficult at all Not difficult at all - - -  Some encounter information is confidential and restricted. Go to Review Flowsheets activity to see all data.   .. Discussed 150 minutes of exercise a week.  Encouraged vitamin D 1000 units and Calcium 1300mg  or 4 servings of dairy a day.  Fasting labs ordered.  Pap due 10/2019.  Pt declined flu shot.  Pt declined shingles vaccine.  UTD on colonoscopy/Tdap.   abx drops sent for otitis externa of left ear. Follow up  as needed.   Discussed taste change for weeks. No other symptoms of covid. Consider increase in Max as cause or sensodyne tooth paste. Try these 2 things and see if taste returns.    See After Visit Summary for Counseling Recommendations

## 2019-08-14 NOTE — Progress Notes (Signed)
b12 looks great. Vitamin d a little low. Make sure taking at least 1000 units a day even in the summer and 2000units a day in the winter.  Thyroid looks good.  Fasting glucose is elevated. Need to add a1c to further evaluate.  Cholesterol is still elevated. TG are still elevated. They are a little better than 9 months ago.  We could start a low dose statin and see if helps both. If you did not want to start a statin we could start with TG medication like tricor

## 2019-08-15 NOTE — Progress Notes (Signed)
Yes please start back on daily lipitor. Fasting would have probably brought down some but not to goal. Once on lipitor for 4-6 months lets recheck. I would add OTC fish oil 4000mg  for your TG.

## 2019-08-16 LAB — CBC WITH DIFFERENTIAL/PLATELET
Absolute Monocytes: 506 cells/uL (ref 200–950)
Basophils Absolute: 79 cells/uL (ref 0–200)
Basophils Relative: 1.3 %
Eosinophils Absolute: 122 cells/uL (ref 15–500)
Eosinophils Relative: 2 %
HCT: 42.8 % (ref 35.0–45.0)
Hemoglobin: 14.5 g/dL (ref 11.7–15.5)
Lymphs Abs: 1549 cells/uL (ref 850–3900)
MCH: 29.2 pg (ref 27.0–33.0)
MCHC: 33.9 g/dL (ref 32.0–36.0)
MCV: 86.3 fL (ref 80.0–100.0)
MPV: 10.3 fL (ref 7.5–12.5)
Monocytes Relative: 8.3 %
Neutro Abs: 3843 cells/uL (ref 1500–7800)
Neutrophils Relative %: 63 %
Platelets: 381 10*3/uL (ref 140–400)
RBC: 4.96 10*6/uL (ref 3.80–5.10)
RDW: 13.1 % (ref 11.0–15.0)
Total Lymphocyte: 25.4 %
WBC: 6.1 10*3/uL (ref 3.8–10.8)

## 2019-08-16 LAB — LIPID PANEL W/REFLEX DIRECT LDL
Cholesterol: 246 mg/dL — ABNORMAL HIGH (ref <200)
HDL: 43 mg/dL — ABNORMAL LOW (ref >=50)
LDL Cholesterol (Calc): 145 mg/dL (calc) — ABNORMAL HIGH
Non-HDL Cholesterol (Calc): 203 mg/dL (calc) — ABNORMAL HIGH (ref <130)
Total CHOL/HDL Ratio: 5.7 (calc) — ABNORMAL HIGH (ref <5.0)
Triglycerides: 377 mg/dL — ABNORMAL HIGH (ref <150)

## 2019-08-16 LAB — COMPLETE METABOLIC PANEL WITH GFR
AG Ratio: 1.9 (calc) (ref 1.0–2.5)
ALT: 21 U/L (ref 6–29)
AST: 20 U/L (ref 10–35)
Albumin: 4.6 g/dL (ref 3.6–5.1)
Alkaline phosphatase (APISO): 88 U/L (ref 37–153)
BUN: 13 mg/dL (ref 7–25)
CO2: 23 mmol/L (ref 20–32)
Calcium: 10 mg/dL (ref 8.6–10.4)
Chloride: 106 mmol/L (ref 98–110)
Creat: 0.8 mg/dL (ref 0.50–1.05)
GFR, Est African American: 100 mL/min/{1.73_m2} (ref >=60)
GFR, Est Non African American: 86 mL/min/{1.73_m2} (ref >=60)
Globulin: 2.4 g/dL (calc) (ref 1.9–3.7)
Glucose, Bld: 106 mg/dL — ABNORMAL HIGH (ref 65–99)
Potassium: 4.1 mmol/L (ref 3.5–5.3)
Sodium: 141 mmol/L (ref 135–146)
Total Bilirubin: 0.7 mg/dL (ref 0.2–1.2)
Total Protein: 7 g/dL (ref 6.1–8.1)

## 2019-08-16 LAB — TSH: TSH: 0.92 mIU/L

## 2019-08-16 LAB — HEMOGLOBIN A1C
Hgb A1c MFr Bld: 5.9 % of total Hgb — ABNORMAL HIGH (ref <5.7)
Mean Plasma Glucose: 123 (calc)
eAG (mmol/L): 6.8 (calc)

## 2019-08-16 LAB — B12 AND FOLATE PANEL
Folate: 16.4 ng/mL
Vitamin B-12: 644 pg/mL (ref 200–1100)

## 2019-08-16 LAB — VITAMIN D 25 HYDROXY (VIT D DEFICIENCY, FRACTURES): Vit D, 25-Hydroxy: 29 ng/mL — ABNORMAL LOW (ref 30–100)

## 2019-08-18 ENCOUNTER — Encounter: Payer: Self-pay | Admitting: Physician Assistant

## 2019-08-18 NOTE — Progress Notes (Signed)
A!C is 5.9 still in pre-diabetes range but up a hair from last year. Continue to watch sugars and carbs.

## 2019-09-05 ENCOUNTER — Other Ambulatory Visit: Payer: Self-pay

## 2019-09-05 ENCOUNTER — Encounter: Payer: Self-pay | Admitting: Physician Assistant

## 2019-09-05 ENCOUNTER — Ambulatory Visit (INDEPENDENT_AMBULATORY_CARE_PROVIDER_SITE_OTHER): Admitting: Physician Assistant

## 2019-09-05 VITALS — BP 139/84 | HR 90 | Temp 98.7°F | Ht 67.0 in | Wt 202.0 lb

## 2019-09-05 DIAGNOSIS — R198 Other specified symptoms and signs involving the digestive system and abdomen: Secondary | ICD-10-CM | POA: Diagnosis not present

## 2019-09-05 DIAGNOSIS — R5383 Other fatigue: Secondary | ICD-10-CM

## 2019-09-05 DIAGNOSIS — R1906 Epigastric swelling, mass or lump: Secondary | ICD-10-CM | POA: Diagnosis not present

## 2019-09-05 MED ORDER — LIDOCAINE VISCOUS HCL 2 % MT SOLN
15.0000 mL | Freq: Once | OROMUCOSAL | Status: AC
  Administered 2019-09-05: 15:00:00 15 mL via OROMUCOSAL

## 2019-09-05 MED ORDER — OMEPRAZOLE 40 MG PO CPDR: 40.0000 mg | DELAYED_RELEASE_CAPSULE | Freq: Every day | ORAL | 1 refills | Status: DC

## 2019-09-05 MED ORDER — ALUM & MAG HYDROXIDE-SIMETH 200-200-20 MG/5ML PO SUSP
30.0000 mL | Freq: Once | ORAL | Status: AC
  Administered 2019-09-05: 30 mL via ORAL

## 2019-09-05 MED ORDER — HYOSCYAMINE SULFATE 0.125 MG PO TBDP
0.1250 mg | ORAL_TABLET | Freq: Once | ORAL | Status: AC
  Administered 2019-09-05: 15:00:00 0.125 mg via SUBLINGUAL

## 2019-09-05 NOTE — Progress Notes (Signed)
Subjective:    Patient ID: Kayla Hood, female    DOB: 06-28-1966, 53 y.o.   MRN: [id number]  HPI  Pt is a 53 yo female who presents to the clinic to discuss fatigue, bloating, epigastric fullness. Pt states "she just does not feel right". 2 sundays ago she ate a piece of toast and had to lay down. She just felt nauseated and then took a nap. She felt really dizzy in the store and had to drink a coke and eat some cheese and bread. She has had really bad reflux since las weekend. Denies any melena or hematochezia. magneisum has made her stools kind of loose.   Mother has ulcerative colitis. She has had a recent colonoscopy negative for IBD.   She also is just tired for last 2 months. Feels like fatigue was pretty sudden and lingering. She denies any depression or mood symptoms. She is sleeping pretty good with ambien. She denies any known tick bites. She wonders with her change in taste she had if she had covid. She would like antibodies tested.   .. Active Ambulatory Problems    Diagnosis Date Noted  . Abnormal chest x-ray 03/03/2013  . Irregular heart beat 03/03/2013  . Sleep disturbance 03/03/2013  . General medical exam 03/03/2013  . Palpitations 03/21/2013  . Tendonitis of shoulder 09/01/2013  . Hives 01/26/2014  . RLQ abdominal pain 01/26/2014  . Facial edema 02/04/2014  . Anxiety state 06/07/2015  . Depression with anxiety 07/08/2015  . Endometriosis 07/08/2015  . Fibroid 07/08/2015  . Exposure to blood or body fluid 11/24/2013  . 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 Hidradenitis suppurativa 12/22/2015  . Hot flashes due to surgical menopause 12/22/2015  . Hyperlipidemia 12/22/2015  . Essential hypertension, benign 12/22/2015  . Vaginal dryness, menopausal 12/22/2015  . Anxiety 12/22/2015  . Screening for diabetes mellitus 12/22/2015  . Nodule of finger of both hands 08/18/2016  . Abnormal weight gain 11/18/2016  . Diverticulitis of large intestine without perforation  or abscess 01/23/2017  . Ear itching 03/12/2017  . Major depression, recurrent, chronic ([place]) 05/09/2017  . Lateral epicondylitis, left elbow 07/23/2017  . Numbness of left hand 09/03/2017  . Family history of early CAD 03/04/2018  . Primary insomnia 03/27/2018  . Overweight (BMI 25.0-29.9) 03/27/2018  . Degenerative disc disease, lumbar 05/16/2018  . Pre-diabetes 10/30/2018  . Class 1 obesity due to excess calories without serious comorbidity with body mass index (BMI) of 30.0 to 30.9 in adult 11/01/2018  . Primary osteoarthritis of left hip 05/08/2019   Resolved Ambulatory Problems    Diagnosis Date Noted  . Left hip pain 06/28/2013  . Viral URI 03/09/2015   Past Medical History:  Diagnosis Date  . Allergy   . Chicken pox   . Depression   . Hypertension   . Incontinence of urine   . UTI (lower urinary tract infection)        Review of Systems See HPI.     Objective:   Physical Exam Vitals signs reviewed.  Constitutional:      Appearance: Normal appearance. She is obese. She is not ill-appearing.  HENT:     Head: Normocephalic.  Neck:     Musculoskeletal: Normal range of motion and neck supple. No muscular tenderness.  Cardiovascular:     Rate and Rhythm: Normal rate and regular rhythm.     Pulses: Normal pulses.  Pulmonary:     Effort: Pulmonary effort is normal.  Abdominal:  General: Bowel sounds are normal. There is distension.     Tenderness: There is abdominal tenderness. There is guarding. There is no right CVA tenderness, left CVA tenderness or rebound.     Hernia: No hernia is present.     Comments: Very distended epigastric area. RUQ guarding.   Lymphadenopathy:     Cervical: No cervical adenopathy.  Neurological:     General: No focal deficit present.     Mental Status: She is alert and oriented to person, place, and time.  Psychiatric:        Mood and Affect: Mood normal.           Assessment & Plan:  Marland KitchenMarland KitchenDuanna was seen today for  follow-up.  Diagnoses and all orders for this visit:  Epigastric fullness -     omeprazole (PRILOSEC) 40 MG capsule; Take 1 capsule (40 mg total) by mouth daily. -     alum & mag hydroxide-simeth (MAALOX/MYLANTA) 200-200-20 MG/5ML suspension 30 mL -     lidocaine (XYLOCAINE) 2 % viscous mouth solution 15 mL -     hyoscyamine (ANASPAZ) disintergrating tablet 0.125 mg -     H. pylori breath test -     SAR CoV2 Serology (COVID 19)AB(IGG)IA  Right upper quadrant guarding -     alum & mag hydroxide-simeth (MAALOX/MYLANTA) 200-200-20 MG/5ML suspension 30 mL -     lidocaine (XYLOCAINE) 2 % viscous mouth solution 15 mL -     hyoscyamine (ANASPAZ) disintergrating tablet 0.125 mg -     H. pylori breath test -     SAR CoV2 Serology (COVID 19)AB(IGG)IA  Fatigue, unspecified type -     CMV abs, IgG+IgM (cytomegalovirus) -     Epstein-Barr virus VCA antibody panel -     B. burgdorfi antibodies -     Antinuclear Antib (ANA) -     C-reactive protein -     Sed Rate (ESR) -     SAR CoV2 Serology (COVID 19)AB(IGG)IA   Unclear etiology of symptoms. Epigastric fullness and RUQ guarding could be a gastritis.  H.pylori tested done today.  GI cocktail given in office.  Start omeprazole.  If no improvement in next 7-10 days will get abdominal u/s to r/o gallbladder disease.   Will do some viral labs for fatigue. She just had other labs completed.  Discussed causes such as sleep apnea, depression, infection, medications. Will continue to follow.

## 2019-09-05 NOTE — Patient Instructions (Signed)
Gastritis, Adult  Gastritis is swelling (inflammation) of the stomach. Gastritis can develop quickly (acute). It can also develop slowly over time (chronic). It is important to get help for this condition. If you do not get help, your stomach can bleed, and you can get sores (ulcers) in your stomach. What are the causes? This condition may be caused by:  Germs that get to your stomach.  Drinking too much alcohol.  Medicines you are taking.  Too much acid in the stomach.  A disease of the intestines or stomach.  Stress.  An allergic reaction.  Crohn's disease.  Some cancer treatments (radiation). Sometimes the cause of this condition is not known. What are the signs or symptoms? Symptoms of this condition include:  Pain in your stomach.  A burning feeling in your stomach.  Feeling sick to your stomach (nauseous).  Throwing up (vomiting).  Feeling too full after you eat.  Weight loss.  Bad breath.  Throwing up blood.  Blood in your poop (stool). How is this diagnosed? This condition may be diagnosed with:  Your medical history and symptoms.  A physical exam.  Tests. These can include: ? Blood tests. ? Stool tests. ? A procedure to look inside your stomach (upper endoscopy). ? A test in which a sample of tissue is taken for testing (biopsy). How is this treated? Treatment for this condition depends on what caused it. You may be given:  Antibiotic medicine, if your condition was caused by germs.  H2 blockers and similar medicines, if your condition was caused by too much acid. Follow these instructions at home: Medicines  Take over-the-counter and prescription medicines only as told by your doctor.  If you were prescribed an antibiotic medicine, take it as told by your doctor. Do not stop taking it even if you start to feel better. Eating and drinking   Eat small meals often, instead of large meals.  Avoid foods and drinks that make your symptoms  worse.  Drink enough fluid to keep your pee (urine) pale yellow. Alcohol use  Do not drink alcohol if: ? Your doctor tells you not to drink. ? You are pregnant, may be pregnant, or are planning to become pregnant.  If you drink alcohol: ? Limit your use to:  0-1 drink a day for women.  0-2 drinks a day for men. ? Be aware of how much alcohol is in your drink. In the [place] one drink equals one 12 oz bottle of beer (355 mL), one 5 oz glass of wine (148 mL), or one 1 oz glass of hard liquor (44 mL). General instructions  Talk with your doctor about ways to manage stress. You can exercise or do deep breathing, meditation, or yoga.  Do not smoke or use products that have nicotine or tobacco. If you need help quitting, ask your doctor.  Keep all follow-up visits as told by your doctor. This is important. Contact a doctor if:  Your symptoms get worse.  Your symptoms go away and then come back. Get help right away if:  You throw up blood or something that looks like coffee grounds.  You have black or dark red poop.  You throw up any time you try to drink fluids.  Your stomach pain gets worse.  You have a fever.  You do not feel better after one week. Summary  Gastritis is swelling (inflammation) of the stomach.  You must get help for this condition. If you do not get help, your stomach   can bleed, and you can get sores (ulcers). °· This condition is diagnosed with medical history, physical exam, or tests. °· You can be treated with medicines for germs or medicines to block too much acid in your stomach. °This information is not intended to replace advice given to you by your health care provider. Make sure you discuss any questions you have with your health care provider. °Document Released: 05/29/2008 Document Revised: 04/30/2018 Document Reviewed: 04/30/2018 °Elsevier Patient Education © 2020 Elsevier Inc. ° °

## 2019-09-06 LAB — SAR COV2 SEROLOGY (COVID19)AB(IGG),IA: SARS CoV2 AB IGG: NEGATIVE

## 2019-09-08 LAB — H. PYLORI BREATH TEST: H. pylori Breath Test: NOT DETECTED

## 2019-09-08 NOTE — Progress Notes (Signed)
Negative for covid antibodies.  Sed rate(amt of inflammation in body is a little elevated) No sign of anything autoimmune.   Waiting for viral panel and breath test.

## 2019-09-08 NOTE — Progress Notes (Signed)
No recent mono. You do have antibodies to mono but not necessarily to high of an antibody count.   Negative for lymes.

## 2019-09-08 NOTE — Progress Notes (Signed)
Negative for h.pylori. how are symptoms today.

## 2019-09-09 ENCOUNTER — Encounter: Payer: Self-pay | Admitting: Physician Assistant

## 2019-09-09 LAB — CMV ABS, IGG+IGM (CYTOMEGALOVIRUS)
CMV IgM: <30 AU/mL
Cytomegalovirus Ab-IgG: <0.6 U/mL

## 2019-09-09 LAB — EPSTEIN-BARR VIRUS VCA ANTIBODY PANEL
EBV NA IgG: 179 U/mL — ABNORMAL HIGH
EBV VCA IgG: 191 U/mL — ABNORMAL HIGH
EBV VCA IgM: <36 U/mL

## 2019-09-09 LAB — C-REACTIVE PROTEIN: CRP: 4.7 mg/L (ref <8.0)

## 2019-09-09 LAB — ANA: Anti Nuclear Antibody (ANA): NEGATIVE

## 2019-09-09 LAB — SEDIMENTATION RATE: Sed Rate: 22 mm/h — ABNORMAL HIGH (ref 0–20)

## 2019-09-09 LAB — B. BURGDORFI ANTIBODIES: B burgdorferi Ab IgG+IgM: <0.9 index

## 2019-09-09 NOTE — Progress Notes (Signed)
CMV virus negative. Are you feeling any better as far as energy?

## 2019-09-23 ENCOUNTER — Ambulatory Visit (HOSPITAL_COMMUNITY): Admitting: Psychiatry

## 2019-09-24 ENCOUNTER — Ambulatory Visit (INDEPENDENT_AMBULATORY_CARE_PROVIDER_SITE_OTHER)

## 2019-09-24 ENCOUNTER — Encounter: Payer: Self-pay | Admitting: Sports Medicine

## 2019-09-24 ENCOUNTER — Other Ambulatory Visit: Payer: Self-pay

## 2019-09-24 ENCOUNTER — Ambulatory Visit (INDEPENDENT_AMBULATORY_CARE_PROVIDER_SITE_OTHER): Admitting: Sports Medicine

## 2019-09-24 DIAGNOSIS — M25522 Pain in left elbow: Secondary | ICD-10-CM

## 2019-09-24 DIAGNOSIS — M7712 Lateral epicondylitis, left elbow: Secondary | ICD-10-CM | POA: Diagnosis not present

## 2019-09-24 NOTE — Assessment & Plan Note (Signed)
Pain elbow left, this is not classic lateral epicondylitis, the pain seems to be more near the brachioradialis. Brachioradialis muscle belly and sheath injection as above. Avoid aggressive massage to this area, x-rays, MRI, she has failed greater than 6 weeks of physician directed conservative measures.

## 2019-09-24 NOTE — Progress Notes (Signed)
Subjective:    CC: Left elbow pain  HPI: For over 1 year now this pleasant 53 year old female has had pain in [place] left elbow, lateral aspect, we did a tennis elbow injection about a year ago, she has done rehabilitation, unfortunately she is having a recurrence of pain, lateral left elbow but not at the common extensor tendon origin.  Moderate, persistent, localized without radiation.  I reviewed the past medical history, family history, social history, surgical history, and allergies today and no changes were needed.  Please see the problem list section below in epic for further details.  Past Medical History: Past Medical History:  Diagnosis Date  . Allergy   . Chicken pox   . Depression   . Hypertension   . Incontinence of urine   . UTI (lower urinary tract infection)    Past Surgical History: Past Surgical History:  Procedure Laterality Date  . ABLATION    . CERVICAL FUSION  2012  . pelvic laproscopic surgeries     4 prior to 2008   Social History: Social History   Socioeconomic History  . Marital status: Married    Spouse name: Kayla Hood  . Number of children: 1  . Years of education: 21  . Highest education level: Not on file  Occupational History  . 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 Financial resource strain: Not on file  . Food insecurity    Worry: Not on file    Inability: Not on file  . Transportation needs    Medical: Not on file    Non-medical: Not on file  Tobacco Use  . Smoking status: Never Smoker  . Smokeless tobacco: Never Used  Substance and Sexual Activity  . Alcohol use: No    Alcohol/week: 2.0 standard drinks    Types: 2 Cans of beer per week    Comment: Occasionally   . Drug use: No  . Sexual activity: Yes    Birth control/protection: None  Lifestyle  . Physical activity    Days per week: Not on file    Minutes per session: Not on file  . Stress: Not on  file  Relationships  . Social Herbalist on phone: Not on file    Gets together: Not on file    Attends religious service: Not on file    Active member of club or organization: Not on file    Attends meetings of clubs or organizations: Not on file    Relationship status: Not on file  Other Topics Concern  . Not on file  Social History Narrative  . Not on file   Family History: Family History  Problem Relation Age of Onset  . Hyperlipidemia Mother   . Ulcerative colitis Mother   . Heart disease Father 72       CAD  . Hypertension Father   . Diverticulitis Father   . Hypertension Brother   . Cancer Maternal Grandfather 16       Stomach Cancer  . Cancer Paternal Grandmother 16       Uterine Cancer  . Stroke Paternal Grandfather 45       CVA  . Cancer Maternal Grandmother    Allergies: Allergies  Allergen Reactions  . Promethazine Anaphylaxis and Other (See Comments)    Other Reaction: OTHER REACTION  . Phenergan [Promethazine Hcl] Rash   Medications: See med rec.  Review of Systems:  No fevers, chills, night sweats, weight loss, chest pain, or shortness of breath.   Objective:    General: Well Developed, well nourished, and in no acute distress.  Neuro: Alert and oriented x3, extra-ocular muscles intact, sensation grossly intact.  HEENT: Normocephalic, atraumatic, pupils equal round reactive to light, neck supple, no masses, no lymphadenopathy, thyroid nonpalpable.  Skin: Warm and dry, no rashes. Cardiac: Regular rate and rhythm, no murmurs rubs or gallops, no lower extremity edema.  Respiratory: Clear to auscultation bilaterally. Not using accessory muscles, speaking in full sentences. Left elbow: Unremarkable to inspection. Range of motion full pronation, supination, flexion, extension. Strength is full to all of the above directions Stable to varus, valgus stress. Negative moving valgus stress test. Tender to palpation over the brachioradialis, she  really did not have any tenderness at the common extensor tendon origin, no reproduction of pain with resisted supination of the elbow, no reproduction of pain with resisted extension of the middle finger or the wrist. Ulnar nerve does not sublux. Negative cubital tunnel Tinel's.  Procedure: Real-time Ultrasound Guided injection of the left common extensor tendon muscle belly as well as brachial radialis Device: GE Logiq E  Verbal informed consent obtained.  Time-out conducted.  Noted no overlying erythema, induration, or other signs of local infection.  Skin prepped in a sterile fashion.  Local anesthesia: Topical Ethyl chloride.  With sterile technique and under real time ultrasound guidance:  Noted intratendinous calcifications and what appeared to be the lateral muscle belly of the common extensor tendon/brachial radialis proximally.  I injected 1 cc Kenalog 40, 1 cc lidocaine, 1 cc bupivacaine around the muscle as well as into the belly at the site of the calcifications. Completed without difficulty  Pain immediately resolved suggesting accurate placement of the medication.  Advised to call if fevers/chills, erythema, induration, drainage, or persistent bleeding.  Images permanently stored and available for review in the ultrasound unit.  Impression: Technically successful ultrasound guided injection.  Impression and Recommendations:    Lateral epicondylitis, left elbow Pain elbow left, this is not classic lateral epicondylitis, the pain seems to be more near the brachioradialis. Brachioradialis muscle belly and sheath injection as above. Avoid aggressive massage to this area, x-rays, MRI, she has failed greater than 6 weeks of physician directed conservative measures.   ___________________________________________ Kayla Hood. [name], M.D., ABFM., CAQSM. Primary Care and Sports Medicine [facility]  Adjunct Professor of [place] of  [facility] of Medicine

## 2019-09-25 ENCOUNTER — Encounter: Payer: Self-pay | Admitting: Sports Medicine

## 2019-09-28 ENCOUNTER — Ambulatory Visit (INDEPENDENT_AMBULATORY_CARE_PROVIDER_SITE_OTHER)

## 2019-09-28 ENCOUNTER — Other Ambulatory Visit: Payer: Self-pay

## 2019-09-28 DIAGNOSIS — M7712 Lateral epicondylitis, left elbow: Secondary | ICD-10-CM

## 2019-09-28 DIAGNOSIS — M25522 Pain in left elbow: Secondary | ICD-10-CM | POA: Diagnosis not present

## 2019-09-29 ENCOUNTER — Encounter: Payer: Self-pay | Admitting: Sports Medicine

## 2019-10-24 ENCOUNTER — Encounter: Payer: Self-pay | Admitting: Physician Assistant

## 2019-10-24 DIAGNOSIS — F5101 Primary insomnia: Secondary | ICD-10-CM

## 2019-10-24 MED ORDER — ZOLPIDEM TARTRATE 10 MG PO TABS: ORAL_TABLET | ORAL | 5 refills | Status: DC

## 2019-10-24 NOTE — Telephone Encounter (Signed)
Last RX sent 05/05/19  Last OV 09/05/19  RX pended

## 2019-11-09 ENCOUNTER — Other Ambulatory Visit: Payer: Self-pay | Admitting: Physician Assistant

## 2019-11-09 DIAGNOSIS — F5101 Primary insomnia: Secondary | ICD-10-CM

## 2019-12-04 ENCOUNTER — Telehealth (HOSPITAL_COMMUNITY): Payer: Self-pay | Admitting: Psychiatry

## 2019-12-04 MED ORDER — ESCITALOPRAM OXALATE 20 MG PO TABS: 20.0000 mg | ORAL_TABLET | Freq: Every day | ORAL | 1 refills | Status: DC

## 2019-12-04 NOTE — Telephone Encounter (Signed)
Pt needs refill lexapro sent to [facility] [place]  Has apt on Monday

## 2019-12-05 ENCOUNTER — Ambulatory Visit (INDEPENDENT_AMBULATORY_CARE_PROVIDER_SITE_OTHER): Admitting: Sports Medicine

## 2019-12-05 ENCOUNTER — Other Ambulatory Visit: Payer: Self-pay

## 2019-12-05 ENCOUNTER — Encounter: Payer: Self-pay | Admitting: Sports Medicine

## 2019-12-05 DIAGNOSIS — M7712 Lateral epicondylitis, left elbow: Secondary | ICD-10-CM

## 2019-12-05 MED ORDER — HYDROCODONE-ACETAMINOPHEN 5-325 MG PO TABS: ORAL_TABLET | ORAL | 0 refills | Status: DC

## 2019-12-05 NOTE — Assessment & Plan Note (Signed)
Fairly extensive tearing of the common extensor tendon. I am can get a surgical opinion from Dr. Griffin Basil, though the tentative plan was to try PRP percutaneous tenotomy to get the scar down. In the meantime she will wear her tennis elbow brace, and we will add a Velcro wrist brace. 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

## 2019-12-05 NOTE — Patient Instructions (Signed)
 Platelet-rich plasma is used in musculoskeletal medicine to focus your own body's ability to heal. It has several well-done published randomized control trials (RCT) which demonstrate both its effectiveness and safety in many musculoskeletal conditions, including osteoarthritis, tendinopathies, and damaged vertebral discs. PRP has been in clinical use since the 1990's. Many people know that platelets form a clot if there is a cut in the skin. It turns out that platelets do not only form a clot, they also start the body's own repair process. When platelets activate to form a clot, they also release alpha granules which have hundreds of chemical messengers in them that initiate and organize repair to the damaged tissue. Precisely placing PRP into the site of injury will initiate the healing process by activating on the damaged cartilage or tendon. This is an inflammatory process, and inflammation is the vital first phase of Healing.  What to expect and how to prepare for PRP  . 2 weeks prior to the procedure: depending on the procedure, you may need to arrange for a driver to bring you home. IF you are having a lower extremity procedure, we can provide crutches as needed.  . 7 days prior to the procedure: Stop taking anti-inflammatory drugs like ibuprofen, Naprosyn, Celebrex, or Meloxicam. Let your doctor know if you have been taking prednisone or other corticosteroids in the last month.  . The day before the procedure: thoroughly shower and clean your skin.   . The day of the procedure: Wear loose-fitting clothing like sweatpants or shorts. If you are having an upper body procedure wear a top that can button or zip up.  PRP will initiate healing and a productive inflammation, and PRP therapy will make the body part treated sore for 4 days to two weeks. Anti-inflammatory drugs (i.e. ibuprofen, Naprosyn, Celebrex) and corticosteroids such as prednisone can blunt or stop this process, so  it is important to not take any anti-inflammatory drugs for 7 days before getting PRP therapy, or for at least three weeks after PRP therapy. Corticosteroid injections can blunt inflammation for 30 days, so let us know if you have had one recently. Depending on the body part injected, you may be in a sling or on crutches for several days. Just like wringing out a wet dishcloth, if you load or tense a tendon or ligament that has just been injected with PRP, some of the PRP injected will squish out. By keeping the body part treated relaxed by using a sling (for the shoulder or arm) or crutches (for hips and legs) for a few days, the PRP can bind in place and do its job.   You may need a driver to bring you home.  Tobacco?nicotine is a potent toxin and its use constricts small blood vessels which are needed for tissue repair.  Tobacco/nicotine use will limit the effectiveness of any treatment and stopping tobacco use is one of the single  greatest actions you can take to improve your health. Avoid toxins like alcohol, which inhibits and depresses the cells needed for tissue repair.  What happens during the PRP procedure?  Platelet rich plasma is made by taking some of your blood and performing a two-stage centrifuge process on it to concentrate the PRP. First, your blood is drawn into a syringe with a small amount of anti-coagulant in it (this is to keep the blood from clotting during this process). The amount of blood drawn is usually about 30-60 milliliters, depending on how much PRP is needed for   the treatment.  (There are 355 milliliters in a 12-ounce soda can for comparison).  Then the blood is transferred in a sterile fashion into a centrifuge tube. It is then centrifuged for the first cycle where the red blood cells are isolated and discarded. In the second centrifuge cycle, the platelet-rich fraction of the remaining plasma is concentrated and placed in a syringe. The skin at the  injection site is numbed with a small amount of topical cooling spray. Dr [name] will then precisely inject the PRP into the injury site using ultrasound guidance.  What to do after your procedure  I will give you specific medicine to control any discomfort you may have after the procedure. Avoid NSAIDs like ibuprofen. Acetaminophen can be used for mild pain.  Depending on the part of the body treated, usually you will be placed in a sling or on crutches for 1 to 3 days. Do your best not to tense or load the treated area during this time. After 3 days, unless otherwise instructed, the treated body part should be used and slowly moved through its full range of motion. It will be sore, but you will not be doing damage by moving it, in fact it needs to move to heal. If you were on crutches for a period of time, walking is ok once you are off the crutches. For now, avoid activities that specifically hurt you before being treated. Exercise is vital to good health and finding a way to cross train around your injury is important not only for your physical health, but for your mental health as well. Ask me about cross training options for your injury. Some brief (10 minutes or less) period of heat or ice therapy will not hurt the therapy, but it is not required. Usually, depending on the initial injury, physical therapy is started from two weeks to four weeks after injection. Improvements in pain and function should be expected from 8 weeks to 12 weeks after injection and some injuries may require more than one treatment.    ___________________________________________ Gwen Her. [name], M.D., ABFM., CAQSM. Primary Care and Sports Medicine [facility]  Adjunct Professor of [place] of [facility] of Medicine

## 2019-12-05 NOTE — Progress Notes (Signed)
Subjective:    CC: Follow-up  HPI: This is a pleasant 53 year old female, we have been treating her for chronic left elbow pain, the last injection was at the common extensor tendon origin approximately 2 and half months ago, unfortunately she is having recurrence of pain, the original plan was to proceed with PRP percutaneous tenotomy, she has a new job at [employer], she does enjoy the job and cannot take time off just yet.  Pain is moderate, worsening, localized the common extensor tendon origin to the right lesion.  I reviewed the past medical history, family history, social history, surgical history, and allergies today and no changes were needed.  Please see the problem list section below in epic for further details.  Past Medical History: Past Medical History:  Diagnosis Date  . Allergy   . Chicken pox   . Depression   . Hypertension   . Incontinence of urine   . UTI (lower urinary tract infection)    Past Surgical History: Past Surgical History:  Procedure Laterality Date  . ABLATION    . CERVICAL FUSION  2012  . pelvic laproscopic surgeries     4 prior to 2008   Social History: Social History   Socioeconomic History  . Marital status: Married    Spouse name: Kadeidre Sonneborn  . Number of children: 1  . Years of education: 63  . Highest education level: Not on file  Occupational History  . 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 Smoking status: Never Smoker  . Smokeless tobacco: Never Used  Substance and Sexual Activity  . Alcohol use: No    Alcohol/week: 2.0 standard drinks    Types: 2 Cans of beer per week    Comment: Occasionally   . Drug use: No  . Sexual activity: Yes    Birth control/protection: None  Other Topics Concern  . Not on file  Social History Narrative  . Not on file   Social Determinants of Health   Financial Resource Strain:   . Difficulty of Paying Living  Expenses: Not on file  Food Insecurity:   . Worried About Charity fundraiser in the Last Year: Not on file  . Ran Out of Food in the Last Year: Not on file  Transportation Needs:   . Lack of Transportation (Medical): Not on file  . Lack of Transportation (Non-Medical): Not on file  Physical Activity:   . Days of Exercise per Week: Not on file  . Minutes of Exercise per Session: Not on file  Stress:   . Feeling of Stress : Not on file  Social Connections:   . Frequency of Communication with Friends and Family: Not on file  . Frequency of Social Gatherings with Friends and Family: Not on file  . Attends Religious Services: Not on file  . Active Member of Clubs or Organizations: Not on file  . Attends Archivist Meetings: Not on file  . Marital Status: Not on file   Family History: Family History  Problem Relation Age of Onset  . Hyperlipidemia Mother   . Ulcerative colitis Mother   . Heart disease Father 81       CAD  . Hypertension Father   . Diverticulitis Father   . Hypertension Brother   . Cancer Maternal Grandfather 27       Stomach Cancer  . Cancer Paternal Grandmother 15  Uterine Cancer  . Stroke Paternal Grandfather 74       CVA  . Cancer Maternal Grandmother    Allergies: Allergies  Allergen Reactions  . Promethazine Anaphylaxis and Other (See Comments)    Other Reaction: OTHER REACTION  . Phenergan [Promethazine Hcl] Rash   Medications: See med rec.  Review of Systems: No fevers, chills, night sweats, weight loss, chest pain, or shortness of breath.   Objective:    General: Well Developed, well nourished, and in no acute distress.  Neuro: Alert and oriented x3, extra-ocular muscles intact, sensation grossly intact.  HEENT: Normocephalic, atraumatic, pupils equal round reactive to light, neck supple, no masses, no lymphadenopathy, thyroid nonpalpable.  Skin: Warm and dry, no rashes. Cardiac: Regular rate and rhythm, no murmurs rubs or  gallops, no lower extremity edema.  Respiratory: Clear to auscultation bilaterally. Not using accessory muscles, speaking in full sentences. Left elbow: Unremarkable to inspection. Range of motion full pronation, supination, flexion, extension. Strength is full to all of the above directions Stable to varus, valgus stress. Negative moving valgus stress test. Tender to palpation at the common extensor tendon origin Ulnar nerve does not sublux. Negative cubital tunnel Tinel's.  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 I am can get a surgical opinion from Dr. Griffin Basil, though the tentative plan was to try PRP percutaneous tenotomy to get the scar down. In the meantime she will wear her tennis elbow brace, and we will add a Velcro wrist brace. 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   ___________________________________________ Gwen Her. [name], M.D., ABFM., CAQSM. Primary Care and Sports Medicine [place] [facility]  Adjunct Professor of [place] of [facility] of Medicine

## 2019-12-08 ENCOUNTER — Encounter (HOSPITAL_COMMUNITY): Payer: Self-pay | Admitting: Psychiatry

## 2019-12-08 ENCOUNTER — Ambulatory Visit (INDEPENDENT_AMBULATORY_CARE_PROVIDER_SITE_OTHER): Admitting: Psychiatry

## 2019-12-08 DIAGNOSIS — F411 Generalized anxiety disorder: Secondary | ICD-10-CM | POA: Diagnosis not present

## 2019-12-08 DIAGNOSIS — F339 Major depressive disorder, recurrent, unspecified: Secondary | ICD-10-CM

## 2019-12-08 DIAGNOSIS — F5102 Adjustment insomnia: Secondary | ICD-10-CM | POA: Diagnosis not present

## 2019-12-08 NOTE — Progress Notes (Signed)
[facility] Outpatient Follow up visit  Tele psych visit Patient Identification: Kayla Hood MRN:  [id number] Date of Evaluation:  12/08/2019 Referral Source: 53  years old currently married Caucasian female initially referred by primary care office for management of depression and anxiety  Chief Complaint:   depression follow up  Visit Diagnosis:    ICD-10-CM   1. Major depression, recurrent, chronic (HCC)  F33.9   2. GAD (generalized anxiety disorder)  F41.1   3. Adjustment insomnia  F51.02     History of Present Illness:     I connected with KERAH NEUGENT on 12/08/19 at  4:15 PM EST by telephone and verified that I am speaking with the correct person using two identifiers.    I discussed the limitations, risks, security and privacy concerns of performing an evaluation and management service by telephone and the availability of in person appointments. I also discussed with the patient that there may be a patient responsible charge related to this service. The patient expressed understanding and agreed to proceed.  Doing better after restart back on lexapro. Depression better. Still avoids crowds or closed spaces  Aggravating factors; country living. When son is in college Modifying factors; keeps busy.. son back from college  Severity of depression;better Sleep problems without [name], now gets from [facility]  Duration 4 years  Past Psychiatric History: depression, anxiety   Previous Psychotropic Medications: Yes  VIIbryd made her agitated.  zoloft didn't help later Substance Abuse History in the last 12 months:  No.  Consequences of Substance Abuse: NA  Past Medical History:  Past Medical History:  Diagnosis Date  . Allergy   . Chicken pox   . Depression   . Hypertension   . Incontinence of urine   . UTI (lower urinary tract infection)     Past Surgical History:  Procedure Laterality Date  . ABLATION    . CERVICAL FUSION  2012  . pelvic laproscopic surgeries      4 prior to 2008    Family Psychiatric History: Dad; alcohol use in past. Depression. MOm side has depression and some bipolar in family  Family History:  Family History  Problem Relation Age of Onset  . Hyperlipidemia Mother   . Ulcerative colitis Mother   . Heart disease Father 46       CAD  . Hypertension Father   . Diverticulitis Father   . Hypertension Brother   . Cancer Maternal Grandfather 40       Stomach Cancer  . Cancer Paternal Grandmother 38       Uterine Cancer  . Stroke Paternal Grandfather 51       CVA  . Cancer Maternal Grandmother     Social History:   Social History   Socioeconomic History  . Marital status: Married    Spouse name: Aliyha Reever  . Number of children: 1  . Years of education: 56  . Highest education level: Not on file  Occupational History  . 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 Smoking status: Never Smoker  . Smokeless tobacco: Never Used  Substance and Sexual Activity  . Alcohol use: No    Alcohol/week: 2.0 standard drinks    Types: 2 Cans of beer per week    Comment: Occasionally   . Drug use: No  . Sexual activity: Yes    Birth control/protection: None  Other Topics Concern  . Not  on file  Social History Narrative  . Not on file   Social Determinants of Health   Financial Resource Strain:   . Difficulty of Paying Living Expenses: Not on file  Food Insecurity:   . Worried About Charity fundraiser in the Last Year: Not on file  . Ran Out of Food in the Last Year: Not on file  Transportation Needs:   . Lack of Transportation (Medical): Not on file  . Lack of Transportation (Non-Medical): Not on file  Physical Activity:   . Days of Exercise per Week: Not on file  . Minutes of Exercise per Session: Not on file  Stress:   . Feeling of Stress : Not on file  Social Connections:   . Frequency of Communication with Friends and Family: Not on file   . Frequency of Social Gatherings with Friends and Family: Not on file  . Attends Religious Services: Not on file  . Active Member of Clubs or Organizations: Not on file  . Attends Archivist Meetings: Not on file  . Marital Status: Not on file     Allergies:   Allergies  Allergen Reactions  . Promethazine Anaphylaxis and Other (See Comments)    Other Reaction: OTHER REACTION  . Phenergan [Promethazine Hcl] Rash    Metabolic Disorder Labs: Lab Results  Component Value Date   HGBA1C 5.9 (H) 08/13/2019   MPG 123 08/13/2019   MPG 120 10/29/2018   No results found for: PROLACTIN Lab Results  Component Value Date   CHOL 246 (H) 08/13/2019   TRIG 377 (H) 08/13/2019   HDL 43 (L) 08/13/2019   CHOLHDL 5.7 (H) 08/13/2019   VLDL 37 (H) 12/22/2015   LDLCALC 145 (H) 08/13/2019   LDLCALC 173 (H) 10/29/2018     Current Medications: Current Outpatient Medications  Medication Sig Dispense Refill  . amLODipine (NORVASC) 10 MG tablet TAKE 1 TABLET(10 MG) BY MOUTH DAILY 30 tablet 3  . escitalopram (LEXAPRO) 20 MG tablet Take 1 tablet (20 mg total) by mouth daily. One a day 30 tablet 1  . HYDROcodone-acetaminophen (NORCO/VICODIN) 5-325 MG tablet One half tab daily prior to work 30 tablet 0  . omeprazole (PRILOSEC) 40 MG capsule Take 1 capsule (40 mg total) by mouth daily. 30 capsule 1  . zolpidem (AMBIEN) 10 MG tablet TAKE 1 TABLET(10 MG) BY MOUTH AT BEDTIME 30 tablet 5   No current facility-administered medications for this visit.      Psychiatric Specialty Exam: Review of Systems  Cardiovascular: Negative for chest pain.  Psychiatric/Behavioral: Negative for depression and suicidal ideas.    There were no vitals taken for this visit.There is no height or weight on file to calculate BMI.  General Appearance:   Eye Contact:    Speech:  Normal Rate  Volume:  Normal  Mood: fair  Affect:  constricted  Thought Process:  Goal Directed  Orientation:  Full (Time, Place,  and Person)  Thought Content:  Rumination  Suicidal Thoughts:  No  Homicidal Thoughts:  No  Memory:  Immediate;   Fair Recent;   Fair  Judgement:  Fair  Insight:  Fair  Psychomotor Activity:  Normal  Concentration:  Concentration: Fair and Attention Span: Fair  Recall:  [employer] of Knowledge:Fair  Language: Fair  Akathisia:  Negative  Handed:  Right  AIMS (if indicated):    Assets:  Desire for Improvement  ADL's:  Intact  Cognition: WNL  Sleep:  Fair with meds  Treatment Plan Summary: Medication management and Plan as follows  1. Major depression, recurrent , moderate: doing fair, continue lexapro 2. GAD: not worse, continue lexapro 3. Insomnia: now on ambien , it helps. Reviewed sleep hygiene  Fu 32m.  I discussed the assessment and treatment plan with the patient. The patient was provided an opportunity to ask questions and all were answered. The patient agreed with the plan and demonstrated an understanding of the instructions.   The patient was advised to call back or seek an in-person evaluation if the symptoms worsen or if the condition fails to improve as anticipated.  I provided 15 minutes of non-face-to-face time during this encounter. Merian Capron, MD 12/14/20204:19 PM

## 2019-12-10 ENCOUNTER — Other Ambulatory Visit (HOSPITAL_BASED_OUTPATIENT_CLINIC_OR_DEPARTMENT_OTHER): Payer: Self-pay | Admitting: Physician Assistant

## 2019-12-10 DIAGNOSIS — R109 Unspecified abdominal pain: Secondary | ICD-10-CM

## 2019-12-10 DIAGNOSIS — R7989 Other specified abnormal findings of blood chemistry: Secondary | ICD-10-CM

## 2019-12-13 ENCOUNTER — Other Ambulatory Visit: Payer: Self-pay

## 2019-12-13 ENCOUNTER — Ambulatory Visit (HOSPITAL_BASED_OUTPATIENT_CLINIC_OR_DEPARTMENT_OTHER)
Admission: RE | Admit: 2019-12-13 | Discharge: 2019-12-13 | Disposition: A | Discharge status: Home / Self Care | Source: Ambulatory Visit | Attending: Physician Assistant | Admitting: Physician Assistant

## 2019-12-13 DIAGNOSIS — R7989 Other specified abnormal findings of blood chemistry: Secondary | ICD-10-CM | POA: Insufficient documentation

## 2019-12-13 DIAGNOSIS — R109 Unspecified abdominal pain: Secondary | ICD-10-CM | POA: Insufficient documentation

## 2019-12-17 DIAGNOSIS — M7712 Lateral epicondylitis, left elbow: Secondary | ICD-10-CM

## 2019-12-17 NOTE — Telephone Encounter (Signed)
Referral pended

## 2019-12-17 NOTE — Telephone Encounter (Signed)
Done. Thank you.

## 2019-12-31 ENCOUNTER — Ambulatory Visit (INDEPENDENT_AMBULATORY_CARE_PROVIDER_SITE_OTHER): Admitting: Physician Assistant

## 2019-12-31 ENCOUNTER — Encounter: Payer: Self-pay | Admitting: Physician Assistant

## 2019-12-31 VITALS — Temp 97.3°F | Ht 67.0 in | Wt 200.0 lb

## 2019-12-31 DIAGNOSIS — K8020 Calculus of gallbladder without cholecystitis without obstruction: Secondary | ICD-10-CM

## 2019-12-31 DIAGNOSIS — R7989 Other specified abnormal findings of blood chemistry: Secondary | ICD-10-CM | POA: Insufficient documentation

## 2019-12-31 HISTORY — DX: Other specified abnormal findings of blood chemistry: R79.89

## 2019-12-31 HISTORY — DX: Calculus of gallbladder without cholecystitis without obstruction: K80.20

## 2019-12-31 NOTE — Progress Notes (Signed)
Patient ID: Kayla Hood, female   DOB: 1966-01-06, 54 y.o.   MRN: [id number] .Marland KitchenVirtual Visit via Video Note  I connected with SHELSEY JERGE on 12/31/19 at  3:20 PM EST by a video enabled telemedicine application and verified that I am speaking with the correct person using two identifiers.  Location: Patient: home Provider: clinic   I discussed the limitations of evaluation and management by telemedicine and the availability of in person appointments. The patient expressed understanding and agreed to proceed.  History of Present Illness: Patient is a 54 year old female with recent diagnosis of gallstones and elevated liver enzymes over Christmas.  After abdominal ultrasound and MRCP was done showed gallstones.  She has a surgery scheduled for January 28 for cholecystectomy.  She wanted my opinion if she could delay surgery into the summer.  .. Active Ambulatory Problems    Diagnosis Date Noted  . Abnormal chest x-ray 03/03/2013  . Irregular heart beat 03/03/2013  . Sleep disturbance 03/03/2013  . General medical exam 03/03/2013  . Palpitations 03/21/2013  . Tendonitis of shoulder 09/01/2013  . Hives 01/26/2014  . RLQ abdominal pain 01/26/2014  . Facial edema 02/04/2014  . Anxiety state 06/07/2015  . Depression with anxiety 07/08/2015  . Endometriosis 07/08/2015  . Fibroid 07/08/2015  . Exposure to blood or body fluid 11/24/2013  . 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 Hidradenitis suppurativa 12/22/2015  . Hot flashes due to surgical menopause 12/22/2015  . Hyperlipidemia 12/22/2015  . Essential hypertension, benign 12/22/2015  . Vaginal dryness, menopausal 12/22/2015  . Anxiety 12/22/2015  . Screening for diabetes mellitus 12/22/2015  . Nodule of finger of both hands 08/18/2016  . Abnormal weight gain 11/18/2016  . Diverticulitis of large intestine without perforation or abscess 01/23/2017  . Ear itching 03/12/2017  . Major depression, recurrent, chronic ([place]) 05/09/2017   . Lateral epicondylitis, left elbow 07/23/2017  . Numbness of left hand 09/03/2017  . Family history of early CAD 03/04/2018  . Primary insomnia 03/27/2018  . Overweight (BMI 25.0-29.9) 03/27/2018  . Degenerative disc disease, lumbar 05/16/2018  . Pre-diabetes 10/30/2018  . Class 1 obesity due to excess calories without serious comorbidity with body mass index (BMI) of 30.0 to 30.9 in adult 11/01/2018  . Primary osteoarthritis of left hip 05/08/2019  . Gallstones 12/31/2019  . Elevated LFTs 12/31/2019   Resolved Ambulatory Problems    Diagnosis Date Noted  . Left hip pain 06/28/2013  . Viral URI 03/09/2015   Past Medical History:  Diagnosis Date  . Allergy   . Chicken pox   . Depression   . Hypertension   . Incontinence of urine   . UTI (lower urinary tract infection)    Reviewed med, allergy, problem list.    Observations/Objective: No acute distress Normal mood and appearance.   .. Today's Vitals   12/31/19 1431  Temp: (!) 97.3 F (36.3 C)  TempSrc: Oral  Weight: 200 lb (90.7 kg)  Height: 5\' 7"  (1.702 m)   Body mass index is 31.32 kg/m.    Assessment and Plan: Marland KitchenMarland KitchenSigrid was seen today for abdominal pain.  Diagnoses and all orders for this visit:  Gallstones  Elevated LFTs   Reviewed labs liver enzymes improving.   Reassured patient that she should proceed with surgery as planned.  It is very likely that these gallstones are going to cause some sort of exacerbation in the future and with the elevated liver enzymes there is a lot of indication to go ahead and get the  gallbladder out.  Patient is in agreement with plan and will proceed with scheduled surgery on January 28.     Follow Up Instructions:    I discussed the assessment and treatment plan with the patient. The patient was provided an opportunity to ask questions and all were answered. The patient agreed with the plan and demonstrated an understanding of the instructions.   The patient was  advised to call back or seek an in-person evaluation if the symptoms worsen or if the condition fails to improve as anticipated.  Iran Planas, PA-C

## 2019-12-31 NOTE — Progress Notes (Signed)
Wants to talk before gallbladder surgery Scheduled 01/21/2019

## 2020-01-11 ENCOUNTER — Other Ambulatory Visit: Payer: Self-pay | Admitting: Sports Medicine

## 2020-01-11 DIAGNOSIS — I10 Essential (primary) hypertension: Secondary | ICD-10-CM

## 2020-01-12 NOTE — Telephone Encounter (Signed)
To PCP

## 2020-02-23 ENCOUNTER — Other Ambulatory Visit (HOSPITAL_COMMUNITY): Payer: Self-pay

## 2020-02-23 MED ORDER — ESCITALOPRAM OXALATE 20 MG PO TABS: 20.0000 mg | ORAL_TABLET | Freq: Every day | ORAL | 1 refills | Status: DC

## 2020-02-28 ENCOUNTER — Emergency Department (INDEPENDENT_AMBULATORY_CARE_PROVIDER_SITE_OTHER)
Admission: EM | Admit: 2020-02-28 | Discharge: 2020-02-28 | Disposition: A | Discharge status: Home / Self Care | Payer: BC Managed Care – PPO | Source: Home / Self Care

## 2020-02-28 ENCOUNTER — Encounter: Payer: Self-pay | Admitting: Emergency Medicine

## 2020-02-28 ENCOUNTER — Other Ambulatory Visit: Payer: Self-pay

## 2020-02-28 DIAGNOSIS — N611 Abscess of the breast and nipple: Secondary | ICD-10-CM | POA: Diagnosis not present

## 2020-02-28 MED ORDER — SULFAMETHOXAZOLE-TRIMETHOPRIM 800-160 MG PO TABS: 1.0000 | ORAL_TABLET | Freq: Two times a day (BID) | ORAL | 0 refills | Status: AC

## 2020-02-28 NOTE — ED Triage Notes (Signed)
Red/yellow area under R breast - started 2 weeks ago w/ red bump - no head to it  Denies itching, no raised areas Pressure with bra Chole last Thursday (1 week ago)

## 2020-02-28 NOTE — Discharge Instructions (Signed)
°  Keep area clean with warm water and mild soap. Pat dry. Do not rub the area. You may apply a warm damp washcloth 2-3 times daily for up to 20 minutes at a time to try to encourage drainage (the area may or may not drain on its own).  Please take antibiotics as prescribed.  You may want to take over the counter probiotics or eat yogurt with probiotics to help limit stomach upset.    It is recommended you discuss today's visit with your surgeon so they are aware you are on this antibiotic.    Please follow up in 3-4 days if not improving, sooner if worsening as the area may need to be cut open to allow drainage of pus.

## 2020-02-28 NOTE — ED Provider Notes (Signed)
[facility]    CSN: [id number] Arrival date & time: 02/28/20  [id number]      History   Chief Complaint Chief Complaint  Patient presents with  . Recurrent Skin Infections    under R breast    HPI Kayla Hood is a 54 y.o. female.   HPI Kayla Hood is a 54 y.o. female presenting to UC with c/o gradually worsening redness, swelling, and tenderness under her Right breast.  Pt noticed a small bump in the area about 2 weeks ago, which has gradually increased in size.  She had her gallbladder removed 1 week ago and has only recently started to wear her bra, but this has put increased pressure on the bump and caused irritation.  Pain is aching, 3/10.  No drainage.  Denies fever or chills but she has been taking ibuprofen for pain from her recent surgery. Denies n/v/d. She has a virtual f/u with her surgeon on Wednesday, 3/10.  She was given a shot of antibiotics just prior to the surgery but she is not on any antibiotics at this time.   Past Medical History:  Diagnosis Date  . Allergy   . Chicken pox   . Depression   . Hypertension   . Incontinence of urine   . UTI (lower urinary tract infection)     Patient Active Problem List   Diagnosis Date Noted  . Gallstones 12/31/2019  . Elevated LFTs 12/31/2019  . Primary osteoarthritis of left hip 05/08/2019  . Class 1 obesity due to excess calories without serious comorbidity with body mass index (BMI) of 30.0 to 30.9 in adult 11/01/2018  . Pre-diabetes 10/30/2018  . Degenerative disc disease, lumbar 05/16/2018  . Primary insomnia 03/27/2018  . Overweight (BMI 25.0-29.9) 03/27/2018  . Family history of early CAD 03/04/2018  . Numbness of left hand 09/03/2017  . Lateral epicondylitis, left elbow 07/23/2017  . Major depression, recurrent, chronic ([place]) 05/09/2017  . Ear itching 03/12/2017  . Diverticulitis of large intestine without perforation or abscess 01/23/2017  . Abnormal weight gain 11/18/2016  . Nodule of finger  of both hands 08/18/2016  . Hidradenitis suppurativa 12/22/2015  . Hot flashes due to surgical menopause 12/22/2015  . Hyperlipidemia 12/22/2015  . Essential hypertension, benign 12/22/2015  . Vaginal dryness, menopausal 12/22/2015  . Anxiety 12/22/2015  . Screening for diabetes mellitus 12/22/2015  . Depression with anxiety 07/08/2015  . Endometriosis 07/08/2015  . Fibroid 07/08/2015  . 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 Anxiety state 06/07/2015  . Facial edema 02/04/2014  . Hives 01/26/2014  . RLQ abdominal pain 01/26/2014  . Exposure to blood or body fluid 11/24/2013  . Tendonitis of shoulder 09/01/2013  . Palpitations 03/21/2013  . Abnormal chest x-ray 03/03/2013  . Irregular heart beat 03/03/2013  . Sleep disturbance 03/03/2013  . General medical exam 03/03/2013    Past Surgical History:  Procedure Laterality Date  . ABLATION    . CERVICAL FUSION  2012  . CHOLECYSTECTOMY    . pelvic laproscopic surgeries     4 prior to 2008    OB History    Gravida  3   Para  1   Term      Preterm      AB  2   Living  1     SAB  2   TAB      Ectopic      Multiple      Live Births  Home Medications    Prior to Admission medications   Medication Sig Start Date End Date Taking? Authorizing Provider  amLODipine (NORVASC) 10 MG tablet TAKE 1 TABLET(10 MG) BY MOUTH DAILY 01/12/20  Yes Breeback, Jade L, PA-C  escitalopram (LEXAPRO) 20 MG tablet Take 1 tablet (20 mg total) by mouth daily. One a day 02/23/20  Yes Merian Capron, MD  HYDROcodone-acetaminophen (NORCO/VICODIN) 5-325 MG tablet One half tab daily prior to work 12/05/19  Yes Silverio Decamp, MD  zolpidem (AMBIEN) 10 MG tablet TAKE 1 TABLET(10 MG) BY MOUTH AT BEDTIME 11/10/19  Yes Breeback, Jade L, PA-C  omeprazole (PRILOSEC) 40 MG capsule Take 1 capsule (40 mg total) by mouth daily. 09/05/19   Breeback, Jade L, PA-C  sulfamethoxazole-trimethoprim (BACTRIM DS) 800-160 MG tablet Take 1  tablet by mouth 2 (two) times daily for 7 days. 02/28/20 03/06/20  Noe Gens, PA-C    Family History Family History  Problem Relation Age of Onset  . Hyperlipidemia Mother   . Ulcerative colitis Mother   . Heart disease Father 82       CAD  . Hypertension Father   . Diverticulitis Father   . Hypertension Brother   . Cancer Maternal Grandfather 79       Stomach Cancer  . Cancer Paternal Grandmother 16       Uterine Cancer  . Stroke Paternal Grandfather 53       CVA  . Cancer Maternal Grandmother     Social History Social History   Tobacco Use  . Smoking status: Never Smoker  . Smokeless tobacco: Never Used  Substance Use Topics  . Alcohol use: No    Alcohol/week: 2.0 standard drinks    Types: 2 Cans of beer per week    Comment: Occasionally   . Drug use: No     Allergies   Promethazine and Phenergan [promethazine hcl]   Review of Systems Review of Systems  Constitutional: Negative for appetite change, chills and fever.  Gastrointestinal: Positive for abdominal pain (mild from surgery). Negative for blood in stool, constipation, diarrhea, nausea and vomiting.  Musculoskeletal: Negative for back pain.  Skin: Positive for color change. Negative for wound.     Physical Exam Triage Vital Signs ED Triage Vitals  Enc Vitals Group     BP 02/28/20 0858 (!) 152/90     Pulse Rate 02/28/20 0858 (!) 107     Resp 02/28/20 0858 17     Temp 02/28/20 0858 99.5 F (37.5 C)     Temp Source 02/28/20 0858 Oral     SpO2 02/28/20 0858 98 %     Weight 02/28/20 0859 200 lb (90.7 kg)     Height 02/28/20 0859 5\' 7"  (1.702 m)     Head Circumference --      Peak Flow --      Pain Score 02/28/20 0858 3     Pain Loc --      Pain Edu? --      Excl. in [place]? --    No data found.  Updated Vital Signs BP (!) 152/90 (BP Location: Right Arm)   Pulse (!) 107   Temp 99.5 F (37.5 C) (Oral)   Resp 17   Ht 5\' 7"  (1.702 m)   Wt 200 lb (90.7 kg)   SpO2 98%   BMI 31.32 kg/m    Visual Acuity Right Eye Distance:   Left Eye Distance:   Bilateral Distance:    Right Eye Near:  Left Eye Near:    Bilateral Near:     Physical Exam Vitals and nursing note reviewed.  Constitutional:      General: She is not in acute distress.    Appearance: Normal appearance. She is well-developed. She is not ill-appearing, toxic-appearing or diaphoretic.  HENT:     Head: Normocephalic and atraumatic.  Cardiovascular:     Rate and Rhythm: Regular rhythm. Tachycardia present.     Comments: Mild tachycardia, regular rhythm Pulmonary:     Effort: Pulmonary effort is normal. No respiratory distress.  Chest:     Breasts:        Right: Tenderness present.     Abdominal:     General: There is no distension.     Palpations: Abdomen is soft.       Comments: Surgical incisions- clean and dry. No erythema.  Musculoskeletal:        General: Normal range of motion.     Cervical back: Normal range of motion.  Skin:    General: Skin is warm and dry.  Neurological:     Mental Status: She is alert and oriented to person, place, and time.  Psychiatric:        Behavior: Behavior normal.      UC Treatments / Results  Labs (all labs ordered are listed, but only abnormal results are displayed) Labs Reviewed - No data to display  EKG   Radiology No results found.  Procedures Procedures (including critical care time)  Medications Ordered in UC Medications - No data to display  Initial Impression / Assessment and Plan / UC Course  I have reviewed the triage vital signs and the nursing notes.  Pertinent labs & imaging results that were available during my care of the patient were reviewed by me and considered in my medical decision making (see chart for details).     Hx and exam c/w skin abscess  Will start pt on Bactrim to cover for MRSA given recent hospitalization.  Surgical incisions appear to be healing well.  AVS provided   Final Clinical Impressions(s) / UC  Diagnoses   Final diagnoses:  Abscess of skin of breast     Discharge Instructions      Keep area clean with warm water and mild soap. Pat dry. Do not rub the area. You may apply a warm damp washcloth 2-3 times daily for up to 20 minutes at a time to try to encourage drainage (the area may or may not drain on its own).  Please take antibiotics as prescribed.  You may want to take over the counter probiotics or eat yogurt with probiotics to help limit stomach upset.    It is recommended you discuss today's visit with your surgeon so they are aware you are on this antibiotic.    Please follow up in 3-4 days if not improving, sooner if worsening as the area may need to be cut open to allow drainage of pus.     ED Prescriptions    Medication Sig Dispense Auth. Provider   sulfamethoxazole-trimethoprim (BACTRIM DS) 800-160 MG tablet Take 1 tablet by mouth 2 (two) times daily for 7 days. 14 tablet Noe Gens, [place]     PDMP not reviewed this encounter.   Noe Gens, [place] 02/28/20 [phone]

## 2020-03-29 ENCOUNTER — Other Ambulatory Visit: Payer: Self-pay

## 2020-03-29 ENCOUNTER — Ambulatory Visit (INDEPENDENT_AMBULATORY_CARE_PROVIDER_SITE_OTHER): Payer: BC Managed Care – PPO | Admitting: Physician Assistant

## 2020-03-29 VITALS — BP 145/95 | HR 92 | Ht 67.0 in | Wt 199.0 lb

## 2020-03-29 DIAGNOSIS — F5101 Primary insomnia: Secondary | ICD-10-CM | POA: Diagnosis not present

## 2020-03-29 DIAGNOSIS — S2000XA Contusion of breast, unspecified breast, initial encounter: Secondary | ICD-10-CM

## 2020-03-29 DIAGNOSIS — N6489 Other specified disorders of breast: Secondary | ICD-10-CM | POA: Diagnosis not present

## 2020-03-29 DIAGNOSIS — R238 Other skin changes: Secondary | ICD-10-CM

## 2020-03-29 HISTORY — DX: Contusion of breast, unspecified breast, initial encounter: S20.00XA

## 2020-03-29 MED ORDER — ZOLPIDEM TARTRATE 10 MG PO TABS: 10.0000 mg | ORAL_TABLET | Freq: Every evening | ORAL | 5 refills | Status: DC | PRN

## 2020-03-29 MED ORDER — DOXYCYCLINE HYCLATE 100 MG PO TABS: 100.0000 mg | ORAL_TABLET | Freq: Two times a day (BID) | ORAL | 0 refills | Status: DC

## 2020-03-29 NOTE — Progress Notes (Signed)
Subjective:    Patient ID: Kayla Hood, female    DOB: [birth year], 54 y.o.   MRN: [id number]  HPI  Pt is a 54 yo female who presents to the clinic with a right breast concern. She noticed a bump on right breast 2 weeks before her cholecystectomy on 02/19/2020. It continued to enlarge and get darker, more swollen and develop a yellow ring like bruise around that so went to UC 1 week after surgery. They suspected more abscess and cellulitis and was treated with bactrim. Area did get better but never when away. It has now been 6 weeks of symptoms. It often swells up more temporarily and then goes back down again. No known trauma to area. No active drainage. wearing a bra worsens appearance. No warmth or tenderness.  She denies any pain or itching but feels "full in breast". No nipple discharge. Last mammogram was 2019. No fever or chills.     Pt does well on ambien for insomnia. No problems or concerns. Needs refill.   .. Active Ambulatory Problems    Diagnosis Date Noted  . Abnormal chest x-ray 03/03/2013  . Irregular heart beat 03/03/2013  . Sleep disturbance 03/03/2013  . General medical exam 03/03/2013  . Palpitations 03/21/2013  . Tendonitis of shoulder 09/01/2013  . Hives 01/26/2014  . RLQ abdominal pain 01/26/2014  . Facial edema 02/04/2014  . Anxiety state 06/07/2015  . Depression with anxiety 07/08/2015  . Endometriosis 07/08/2015  . Fibroid 07/08/2015  . Exposure to blood or body fluid 11/24/2013  . 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 Hidradenitis suppurativa 12/22/2015  . Hot flashes due to surgical menopause 12/22/2015  . Hyperlipidemia 12/22/2015  . Essential hypertension, benign 12/22/2015  . Vaginal dryness, menopausal 12/22/2015  . Anxiety 12/22/2015  . Screening for diabetes mellitus 12/22/2015  . Nodule of finger of both hands 08/18/2016  . Abnormal weight gain 11/18/2016  . Diverticulitis of large intestine without perforation or abscess 01/23/2017  . Ear  itching 03/12/2017  . Major depression, recurrent, chronic ([place]) 05/09/2017  . Lateral epicondylitis, left elbow 07/23/2017  . Numbness of left hand 09/03/2017  . Family history of early CAD 03/04/2018  . Primary insomnia 03/27/2018  . Overweight (BMI 25.0-29.9) 03/27/2018  . Degenerative disc disease, lumbar 05/16/2018  . Pre-diabetes 10/30/2018  . Class 1 obesity due to excess calories without serious comorbidity with body mass index (BMI) of 30.0 to 30.9 in adult 11/01/2018  . Primary osteoarthritis of left hip 05/08/2019  . Gallstones 12/31/2019  . Elevated LFTs 12/31/2019  . Bruise of breast 03/29/2020  . Erythematous papules of skin 03/30/2020  . Fullness of breast 03/30/2020   Resolved Ambulatory Problems    Diagnosis Date Noted  . Left hip pain 06/28/2013  . Viral URI 03/09/2015   Past Medical History:  Diagnosis Date  . Allergy   . Chicken pox   . Depression   . Hypertension   . Incontinence of urine   . UTI (lower urinary tract infection)       Review of Systems See HPI.     Objective:   Physical Exam Vitals reviewed.  Constitutional:      Appearance: Normal appearance.  Chest:    Neurological:     General: No focal deficit present.     Mental Status: She is alert.           Assessment & Plan:  Marland KitchenMarland KitchenTayana was seen today for skin problem.  Diagnoses and all orders for this visit:  Bruise of breast -     doxycycline (VIBRA-TABS) 100 MG tablet; Take 1 tablet (100 mg total) by mouth 2 (two) times daily. -     MM DIAG BREAST TOMO BILATERAL -     US BREAST LTD UNI RIGHT INC AXILLA  Primary insomnia -     zolpidem (AMBIEN) 10 MG tablet; Take 1 tablet (10 mg total) by mouth at bedtime as needed for sleep.  Erythematous papules of skin -     doxycycline (VIBRA-TABS) 100 MG tablet; Take 1 tablet (100 mg total) by mouth 2 (two) times daily. -     MM DIAG BREAST TOMO BILATERAL -     US BREAST LTD UNI RIGHT INC AXILLA  Fullness of breast -      doxycycline (VIBRA-TABS) 100 MG tablet; Take 1 tablet (100 mg total) by mouth 2 (two) times daily. -     MM DIAG BREAST TOMO BILATERAL -     US BREAST LTD UNI RIGHT INC AXILLA   Unclear etiology. Does not really appear infectious. If referring to picture from UC looks significantly better due to large area of redness, bruising etc.  Looks like collection of varicose veins and then a superficial skin papule/infection. No real area to drain. Since coming and going and never went away need more imaging. Pt is in need for mammogram. Will send to breast clinic for more imaging. Given doxycycline to start if enlarging or getting more warm, starts having a fever or chills.   Refilled ambien.

## 2020-03-29 NOTE — Patient Instructions (Signed)
Will get breast mammogram.  Use doxy if worsening.

## 2020-03-30 ENCOUNTER — Encounter: Payer: Self-pay | Admitting: Physician Assistant

## 2020-03-30 DIAGNOSIS — N6489 Other specified disorders of breast: Secondary | ICD-10-CM

## 2020-03-30 DIAGNOSIS — R238 Other skin changes: Secondary | ICD-10-CM

## 2020-03-30 HISTORY — DX: Other specified disorders of breast: N64.89

## 2020-03-30 HISTORY — DX: Other skin changes: R23.8

## 2020-04-12 ENCOUNTER — Ambulatory Visit (INDEPENDENT_AMBULATORY_CARE_PROVIDER_SITE_OTHER): Payer: BC Managed Care – PPO | Admitting: Psychiatry

## 2020-04-12 ENCOUNTER — Encounter (HOSPITAL_COMMUNITY): Payer: Self-pay | Admitting: Psychiatry

## 2020-04-12 DIAGNOSIS — F411 Generalized anxiety disorder: Secondary | ICD-10-CM | POA: Diagnosis not present

## 2020-04-12 DIAGNOSIS — F5102 Adjustment insomnia: Secondary | ICD-10-CM

## 2020-04-12 DIAGNOSIS — F339 Major depressive disorder, recurrent, unspecified: Secondary | ICD-10-CM

## 2020-04-12 MED ORDER — ESCITALOPRAM OXALATE 20 MG PO TABS: 20.0000 mg | ORAL_TABLET | Freq: Every day | ORAL | 3 refills | Status: DC

## 2020-04-12 NOTE — Progress Notes (Signed)
[facility] Outpatient Follow up visit  Tele psych visit Patient Identification: Kayla Hood MRN:  [id number] Date of Evaluation:  04/12/2020 Referral Source: 54  years old currently married Caucasian female initially referred by primary care office for management of depression and anxiety  Chief Complaint:    depression follow up  Visit Diagnosis:    ICD-10-CM   1. Major depression, recurrent, chronic (HCC)  F33.9   2. GAD (generalized anxiety disorder)  F41.1   3. Adjustment insomnia  F51.02     History of Present Illness:      I connected with Kayla Hood on 04/12/20 at  4:00 PM EDT by telephone and verified that I am speaking with the correct person using two identifiers.  I discussed the limitations, risks, security and privacy concerns of performing an evaluation and management service by telephone and the availability of in person appointments. I also discussed with the patient that there may be a patient responsible charge related to this service. The patient expressed understanding and agreed to proceed.  Doing good. Working and busy Aggravating factors; country living. When son in college feels lonely Modifying factors; keeps busy.. son back from college  Severity of depression;better Sleep problems without Kayla Hood, now gets from [place] care  Duration 4 plus years  Past Psychiatric History: depression, anxiety   Previous Psychotropic Medications: Yes  VIIbryd made her agitated.  zoloft didn't help later Substance Abuse History in the last 12 months:  No.  Consequences of Substance Abuse: NA  Past Medical History:  Past Medical History:  Diagnosis Date  . Allergy   . Chicken pox   . Depression   . Hypertension   . Incontinence of urine   . UTI (lower urinary tract infection)     Past Surgical History:  Procedure Laterality Date  . ABLATION    . CERVICAL FUSION  2012  . CHOLECYSTECTOMY    . pelvic laproscopic surgeries     4 prior to 2008    Family  Psychiatric History: Dad; alcohol use in past. Depression. MOm side has depression and some bipolar in family  Family History:  Family History  Problem Relation Age of Onset  . Hyperlipidemia Mother   . Ulcerative colitis Mother   . Heart disease Father 33       CAD  . Hypertension Father   . Diverticulitis Father   . Hypertension Brother   . Cancer Maternal Grandfather 53       Stomach Cancer  . Cancer Paternal Grandmother 10       Uterine Cancer  . Stroke Paternal Grandfather 36       CVA  . Cancer Maternal Grandmother     Social History:   Social History   Socioeconomic History  . Marital status: Married    Spouse name: Kayla Hood  . Number of children: 1  . Years of education: 9  . Highest education level: Not on file  Occupational History  . 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 Smoking status: Never Smoker  . Smokeless tobacco: Never Used  Substance and Sexual Activity  . Alcohol use: No    Alcohol/week: 2.0 standard drinks    Types: 2 Cans of beer per week    Comment: Occasionally   . Drug use: No  . Sexual activity: Yes    Birth control/protection: None  Other Topics Concern  . Not on file  Social  History Narrative  . Not on file   Social Determinants of Health   Financial Resource Strain:   . Difficulty of Paying Living Expenses:   Food Insecurity:   . Worried About Charity fundraiser in the Last Year:   . Arboriculturist in the Last Year:   Transportation Needs:   . Film/video editor (Medical):   Marland Kitchen Lack of Transportation (Non-Medical):   Physical Activity:   . Days of Exercise per Week:   . Minutes of Exercise per Session:   Stress:   . Feeling of Stress :   Social Connections:   . Frequency of Communication with Friends and Family:   . Frequency of Social Gatherings with Friends and Family:   . Attends Religious Services:   . Active Member of Clubs or  Organizations:   . Attends Archivist Meetings:   Marland Kitchen Marital Status:      Allergies:   Allergies  Allergen Reactions  . Promethazine Anaphylaxis and Other (See Comments)    Other Reaction: OTHER REACTION  . Phenergan [Promethazine Hcl] Rash    Metabolic Disorder Labs: Lab Results  Component Value Date   HGBA1C 5.9 (H) 08/13/2019   MPG 123 08/13/2019   MPG 120 10/29/2018   No results found for: PROLACTIN Lab Results  Component Value Date   CHOL 246 (H) 08/13/2019   TRIG 377 (H) 08/13/2019   HDL 43 (L) 08/13/2019   CHOLHDL 5.7 (H) 08/13/2019   VLDL 37 (H) 12/22/2015   LDLCALC 145 (H) 08/13/2019   LDLCALC 173 (H) 10/29/2018     Current Medications: Current Outpatient Medications  Medication Sig Dispense Refill  . amLODipine (NORVASC) 10 MG tablet TAKE 1 TABLET(10 MG) BY MOUTH DAILY 90 tablet 3  . doxycycline (VIBRA-TABS) 100 MG tablet Take 1 tablet (100 mg total) by mouth 2 (two) times daily. 20 tablet 0  . escitalopram (LEXAPRO) 20 MG tablet Take 1 tablet (20 mg total) by mouth daily. One a day 30 tablet 3  . HYDROcodone-acetaminophen (NORCO/VICODIN) 5-325 MG tablet One half tab daily prior to work 30 tablet 0  . ibuprofen (ADVIL) 800 MG tablet Take 800 mg by mouth every 8 (eight) hours as needed.    Marland Kitchen omeprazole (PRILOSEC) 40 MG capsule Take 1 capsule (40 mg total) by mouth daily. 30 capsule 1  . zolpidem (AMBIEN) 10 MG tablet Take 1 tablet (10 mg total) by mouth at bedtime as needed for sleep. 30 tablet 5   No current facility-administered medications for this visit.      Psychiatric Specialty Exam: Review of Systems  Cardiovascular: Negative for chest pain.  Psychiatric/Behavioral: Negative for depression and suicidal ideas.    There were no vitals taken for this visit.There is no height or weight on file to calculate BMI.  General Appearance:   Eye Contact:    Speech:  Normal Rate  Volume:  Normal  Mood: fair  Affect:  constricted  Thought  Process:  Goal Directed  Orientation:  Full (Time, Place, and Person)  Thought Content:  Rumination  Suicidal Thoughts:  No  Homicidal Thoughts:  No  Memory:  Immediate;   Fair Recent;   Fair  Judgement:  Fair  Insight:  Fair  Psychomotor Activity:  Normal  Concentration:  Concentration: Fair and Attention Span: Fair  Recall:  [employer] of Knowledge:Fair  Language: Fair  Akathisia:  Negative  Handed:  Right  AIMS (if indicated):    Assets:  Desire for  Improvement  ADL's:  Intact  Cognition: WNL  Sleep:  Fair with meds    Treatment Plan Summary: Medication management and Plan as follows  1. Major depression, recurrent , moderate: stable.. continue lexapro  2. GAD: not worse, continue lexapro 3. Insomnia: now on ambien , it helps. Gets from primary care  Fu 39m. Call for refills when due.  I discussed the assessment and treatment plan with the patient. The patient was provided an opportunity to ask questions and all were answered. The patient agreed with the plan and demonstrated an understanding of the instructions.   The patient was advised to call back or seek an in-person evaluation if the symptoms worsen or if the condition fails to improve as anticipated.  I provided 15 minutes or less  of non-face-to-face time during this encounter. Merian Capron, MD 4/19/20214:04 PM

## 2020-04-28 DIAGNOSIS — M7712 Lateral epicondylitis, left elbow: Secondary | ICD-10-CM

## 2020-04-28 DIAGNOSIS — M25522 Pain in left elbow: Secondary | ICD-10-CM

## 2020-04-28 HISTORY — DX: Pain in left elbow: M25.522

## 2020-04-28 HISTORY — DX: Lateral epicondylitis, left elbow: M77.12

## 2020-08-09 ENCOUNTER — Telehealth (HOSPITAL_COMMUNITY): Admitting: Psychiatry

## 2020-08-10 ENCOUNTER — Telehealth (INDEPENDENT_AMBULATORY_CARE_PROVIDER_SITE_OTHER): Payer: BC Managed Care – PPO | Admitting: Psychiatry

## 2020-08-10 ENCOUNTER — Encounter (HOSPITAL_COMMUNITY): Payer: Self-pay | Admitting: Psychiatry

## 2020-08-10 DIAGNOSIS — F339 Major depressive disorder, recurrent, unspecified: Secondary | ICD-10-CM | POA: Diagnosis not present

## 2020-08-10 DIAGNOSIS — F411 Generalized anxiety disorder: Secondary | ICD-10-CM | POA: Diagnosis not present

## 2020-08-10 DIAGNOSIS — F5102 Adjustment insomnia: Secondary | ICD-10-CM | POA: Diagnosis not present

## 2020-08-10 NOTE — Progress Notes (Signed)
[facility] Outpatient Follow up visit  Tele psych visit Patient Identification: Kayla Hood MRN:  [id number] Date of Evaluation:  08/10/2020 Referral Source: 54  years old currently married Caucasian female initially referred by primary care office for management of depression and anxiety  Chief Complaint:    depression follow up  Visit Diagnosis:    ICD-10-CM   1. Major depression, recurrent, chronic (HCC)  F33.9   2. GAD (generalized anxiety disorder)  F41.1   3. Adjustment insomnia  F51.02     History of Present Illness:      I connected with Kayla Hood on 08/10/20 at  4:30 PM EDT by a video enabled telemedicine application and verified that I am speaking with the correct person using two identifiers.   I discussed the limitations, risks, security and privacy concerns of performing an evaluation and management service by telephone and the availability of in person appointments. I also discussed with the patient that there may be a patient responsible charge related to this service. The patient expressed understanding and agreed to proceed. Patient location: home Provider location: home office  Doing fair, sleep remains concern unless she takes [place] at times. Aggravating factors; country living. When son in college feels lonely Modifying factors; keeps busy.Marland Kitchen son when he is at home  Severity of depression;better  [name], now gets from [place] care  Duration 4 plus years  Past Psychiatric History: depression, anxiety   Previous Psychotropic Medications: Yes  VIIbryd made her agitated.  zoloft didn't help later Substance Abuse History in the last 12 months:  No.  Consequences of Substance Abuse: NA  Past Medical History:  Past Medical History:  Diagnosis Date  . Allergy   . Chicken pox   . Depression   . Hypertension   . Incontinence of urine   . UTI (lower urinary tract infection)     Past Surgical History:  Procedure Laterality Date  . ABLATION    .  CERVICAL FUSION  2012  . CHOLECYSTECTOMY    . pelvic laproscopic surgeries     4 prior to 2008    Family Psychiatric History: Dad; alcohol use in past. Depression. MOm side has depression and some bipolar in family  Family History:  Family History  Problem Relation Age of Onset  . Hyperlipidemia Mother   . Ulcerative colitis Mother   . Heart disease Father 18       CAD  . Hypertension Father   . Diverticulitis Father   . Hypertension Brother   . Cancer Maternal Grandfather 28       Stomach Cancer  . Cancer Paternal Grandmother 16       Uterine Cancer  . Stroke Paternal Grandfather 12       CVA  . Cancer Maternal Grandmother     Social History:   Social History   Socioeconomic History  . Marital status: Married    Spouse name: Cyriah Childrey  . Number of children: 1  . Years of education: 42  . Highest education level: Not on file  Occupational History  . 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 Smoking status: Never Smoker  . Smokeless tobacco: Never Used  Vaping Use  . Vaping Use: Never used  Substance and Sexual Activity  . Alcohol use: No    Alcohol/week: 2.0 standard drinks    Types: 2 Cans of beer per week    Comment: Occasionally   .  Drug use: No  . Sexual activity: Yes    Birth control/protection: None  Other Topics Concern  . Not on file  Social History Narrative  . Not on file   Social Determinants of Health   Financial Resource Strain:   . Difficulty of Paying Living Expenses:   Food Insecurity:   . Worried About Charity fundraiser in the Last Year:   . Arboriculturist in the Last Year:   Transportation Needs:   . Film/video editor (Medical):   Marland Kitchen Lack of Transportation (Non-Medical):   Physical Activity:   . Days of Exercise per Week:   . Minutes of Exercise per Session:   Stress:   . Feeling of Stress :   Social Connections:   . Frequency of Communication with  Friends and Family:   . Frequency of Social Gatherings with Friends and Family:   . Attends Religious Services:   . Active Member of Clubs or Organizations:   . Attends Archivist Meetings:   Marland Kitchen Marital Status:      Allergies:   Allergies  Allergen Reactions  . Promethazine Anaphylaxis and Other (See Comments)    Other Reaction: OTHER REACTION  . Phenergan [Promethazine Hcl] Rash    Metabolic Disorder Labs: Lab Results  Component Value Date   HGBA1C 5.9 (H) 08/13/2019   MPG 123 08/13/2019   MPG 120 10/29/2018   No results found for: PROLACTIN Lab Results  Component Value Date   CHOL 246 (H) 08/13/2019   TRIG 377 (H) 08/13/2019   HDL 43 (L) 08/13/2019   CHOLHDL 5.7 (H) 08/13/2019   VLDL 37 (H) 12/22/2015   LDLCALC 145 (H) 08/13/2019   LDLCALC 173 (H) 10/29/2018     Current Medications: Current Outpatient Medications  Medication Sig Dispense Refill  . amLODipine (NORVASC) 10 MG tablet TAKE 1 TABLET(10 MG) BY MOUTH DAILY 90 tablet 3  . doxycycline (VIBRA-TABS) 100 MG tablet Take 1 tablet (100 mg total) by mouth 2 (two) times daily. 20 tablet 0  . escitalopram (LEXAPRO) 20 MG tablet Take 1 tablet (20 mg total) by mouth daily. One a day 30 tablet 3  . HYDROcodone-acetaminophen (NORCO/VICODIN) 5-325 MG tablet One half tab daily prior to work 30 tablet 0  . ibuprofen (ADVIL) 800 MG tablet Take 800 mg by mouth every 8 (eight) hours as needed.    Marland Kitchen omeprazole (PRILOSEC) 40 MG capsule Take 1 capsule (40 mg total) by mouth daily. 30 capsule 1  . zolpidem (AMBIEN) 10 MG tablet Take 1 tablet (10 mg total) by mouth at bedtime as needed for sleep. 30 tablet 5   No current facility-administered medications for this visit.      Psychiatric Specialty Exam: Review of Systems  Cardiovascular: Negative for chest pain.  Psychiatric/Behavioral: Negative for depression and suicidal ideas.    There were no vitals taken for this visit.There is no height or weight on file to  calculate BMI.  General Appearance:   Eye Contact:    Speech:  Normal Rate  Volume:  Normal  Mood: fair  Affect:  constricted  Thought Process:  Goal Directed  Orientation:  Full (Time, Place, and Person)  Thought Content:  Rumination  Suicidal Thoughts:  No  Homicidal Thoughts:  No  Memory:  Immediate;   Fair Recent;   Fair  Judgement:  Fair  Insight:  Fair  Psychomotor Activity:  Normal  Concentration:  Concentration: Fair and Attention Span: Fair  Recall:  Fair  Fund of Knowledge:Fair  Language: Fair  Akathisia:  Negative  Handed:  Right  AIMS (if indicated):    Assets:  Desire for Improvement  ADL's:  Intact  Cognition: WNL  Sleep:  Fair with meds    Treatment Plan Summary: Medication management and Plan as follows  1. Major depression, recurrent , moderate: doing stable, continue lexapro  2. GAD: not worse, continue lexapro 3. Insomnia: now on ambien , it helps. Gets from primary care  Fu 93m. Call for refills when due.  I discussed the assessment and treatment plan with the patient. The patient was provided an opportunity to ask questions and all were answered. The patient agreed with the plan and demonstrated an understanding of the instructions.   The patient was advised to call back or seek an in-person evaluation if the symptoms worsen or if the condition fails to improve as anticipated.  I provided 15 minutes or less  of non-face-to-face time during this encounter. Merian Capron, MD 8/17/20214:37 PM

## 2020-09-15 ENCOUNTER — Encounter: Payer: Self-pay | Admitting: Physician Assistant

## 2020-09-15 DIAGNOSIS — F5101 Primary insomnia: Secondary | ICD-10-CM

## 2020-09-15 NOTE — Telephone Encounter (Signed)
Rx pended

## 2020-09-16 MED ORDER — ZOLPIDEM TARTRATE 10 MG PO TABS: 10.0000 mg | ORAL_TABLET | Freq: Every evening | ORAL | 0 refills | Status: DC | PRN

## 2020-10-09 ENCOUNTER — Encounter: Payer: Self-pay | Admitting: Emergency Medicine

## 2020-10-09 ENCOUNTER — Emergency Department (INDEPENDENT_AMBULATORY_CARE_PROVIDER_SITE_OTHER)
Admission: EM | Admit: 2020-10-09 | Discharge: 2020-10-09 | Disposition: A | Discharge status: Home / Self Care | Payer: BC Managed Care – PPO | Source: Home / Self Care

## 2020-10-09 DIAGNOSIS — R002 Palpitations: Secondary | ICD-10-CM | POA: Diagnosis not present

## 2020-10-09 DIAGNOSIS — I493 Ventricular premature depolarization: Secondary | ICD-10-CM

## 2020-10-09 DIAGNOSIS — R42 Dizziness and giddiness: Secondary | ICD-10-CM

## 2020-10-09 NOTE — ED Provider Notes (Signed)
[name]    CSN: [id number] Arrival date & time: 10/09/20  1150      History   Chief Complaint Chief Complaint  Patient presents with  . Palpitations  . Dizziness    HPI Kayla Hood is a 54 y.o. female.   HPI Kayla Hood is a 54 y.o. female presenting to UC with c/o intermittent palpitations the last few days, associated dizziness. Hx of palpitations in the past, she was evaluated by a cardiologist and reports having a Holter monitor a few years ago but states the monitor never picked up on any events.  Denies HA, chest pain or SOB.  No cough, congestion, n/v/d. She does take amlodipine 10mg  at night. No recent change in medications.  She has eaten today and has had one cup of coffee and a small amount of unsweetened tea.  She plans to schedule f/u with her PCP but wanted to be evaluated today to make sure it was safe to wait until next week.   Past Medical History:  Diagnosis Date  . Allergy   . Chicken pox   . Depression   . Hypertension   . Incontinence of urine   . UTI (lower urinary tract infection)     Patient Active Problem List   Diagnosis Date Noted  . Erythematous papules of skin 03/30/2020  . Fullness of breast 03/30/2020  . Bruise of breast 03/29/2020  . Gallstones 12/31/2019  . Elevated LFTs 12/31/2019  . Primary osteoarthritis of left hip 05/08/2019  . Class 1 obesity due to excess calories without serious comorbidity with body mass index (BMI) of 30.0 to 30.9 in adult 11/01/2018  . Pre-diabetes 10/30/2018  . Degenerative disc disease, lumbar 05/16/2018  . Primary insomnia 03/27/2018  . Overweight (BMI 25.0-29.9) 03/27/2018  . Family history of early CAD 03/04/2018  . Cervical radiculopathy 12/19/2017  . Numbness of left hand 09/03/2017  . Lateral epicondylitis, left elbow 07/23/2017  . History of left tennis elbow 07/23/2017  . Major depression, recurrent, chronic ([place]) 05/09/2017  . Ear itching 03/12/2017  . Diverticulitis of  large intestine without perforation or abscess 01/23/2017  . Abnormal weight gain 11/18/2016  . Nodule of finger of both hands 08/18/2016  . Hidradenitis suppurativa 12/22/2015  . Hot flashes due to surgical menopause 12/22/2015  . Hyperlipidemia 12/22/2015  . Essential hypertension, benign 12/22/2015  . Vaginal dryness, menopausal 12/22/2015  . Anxiety 12/22/2015  . Screening for diabetes mellitus 12/22/2015  . Depression with anxiety 07/08/2015  . Endometriosis 07/08/2015  . Fibroid 07/08/2015  . 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 Anxiety state 06/07/2015  . Facial edema 02/04/2014  . Hives 01/26/2014  . RLQ abdominal pain 01/26/2014  . Exposure to blood or body fluid 11/24/2013  . Tendonitis of shoulder 09/01/2013  . Palpitations 03/21/2013  . Abnormal chest x-ray 03/03/2013  . Irregular heart beat 03/03/2013  . Sleep disturbance 03/03/2013  . General medical exam 03/03/2013    Past Surgical History:  Procedure Laterality Date  . ABLATION    . CERVICAL FUSION  2012  . CHOLECYSTECTOMY    . pelvic laproscopic surgeries     4 prior to 2008    OB History    Gravida  3   Para  1   Term      Preterm      AB  2   Living  1     SAB  2   TAB      Ectopic  Multiple      Live Births               Home Medications    Prior to Admission medications   Medication Sig Start Date End Date Taking? Authorizing Provider  amLODipine (NORVASC) 10 MG tablet TAKE 1 TABLET(10 MG) BY MOUTH DAILY 01/12/20  Yes Breeback, Jade L, PA-C  escitalopram (LEXAPRO) 20 MG tablet Take 1 tablet (20 mg total) by mouth daily. One a day 04/12/20  Yes Merian Capron, MD  omeprazole (PRILOSEC) 40 MG capsule Take 1 capsule (40 mg total) by mouth daily. 09/05/19  Yes Breeback, Jade L, PA-C  zolpidem (AMBIEN) 10 MG tablet Take 1 tablet (10 mg total) by mouth at bedtime as needed for sleep. 09/16/20  Yes Breeback, Jade L, PA-C  doxycycline (VIBRA-TABS) 100 MG tablet Take 1 tablet (100 mg  total) by mouth 2 (two) times daily. 03/29/20   Donella Stade, PA-C  HYDROcodone-acetaminophen (NORCO/VICODIN) 5-325 MG tablet One half tab daily prior to work 12/05/19   Silverio Decamp, MD  ibuprofen (ADVIL) 800 MG tablet Take 800 mg by mouth every 8 (eight) hours as needed.    [provider]    Family History Family History  Problem Relation Age of Onset  . Hyperlipidemia Mother   . Ulcerative colitis Mother   . Heart disease Father 73       CAD  . Hypertension Father   . Diverticulitis Father   . Hypertension Brother   . Cancer Maternal Grandfather 73       Stomach Cancer  . Cancer Paternal Grandmother 28       Uterine Cancer  . Stroke Paternal Grandfather 43       CVA  . Cancer Maternal Grandmother     Social History Social History   Tobacco Use  . Smoking status: Never Smoker  . Smokeless tobacco: Never Used  Vaping Use  . Vaping Use: Never used  Substance Use Topics  . Alcohol use: No    Alcohol/week: 2.0 standard drinks    Types: 2 Cans of beer per week    Comment: Occasionally   . Drug use: No     Allergies   Promethazine and Phenergan [promethazine hcl]   Review of Systems Review of Systems  Constitutional: Negative for diaphoresis.  Eyes: Negative for visual disturbance.  Respiratory: Negative for shortness of breath.   Cardiovascular: Positive for palpitations. Negative for chest pain and leg swelling.  Gastrointestinal: Negative for diarrhea, nausea and vomiting.  Neurological: Positive for dizziness. Negative for weakness and headaches.     Physical Exam Triage Vital Signs ED Triage Vitals  Enc Vitals Group     BP 10/09/20 1253 139/88     Pulse Rate 10/09/20 1253 78     Resp 10/09/20 1253 15     Temp 10/09/20 1253 98 F (36.7 C)     Temp Source 10/09/20 1253 Oral     SpO2 10/09/20 1253 100 %     Weight 10/09/20 1256 191 lb (86.6 kg)     Height 10/09/20 1256 5\' 7"  (1.702 m)     Head Circumference --      Peak Flow --       Pain Score 10/09/20 1256 0     Pain Loc --      Pain Edu? --      Excl. in GC? --    Orthostatic VS for the past 24 hrs:  BP- Lying Pulse- Lying BP- Sitting  Pulse- Sitting BP- Standing at 0 minutes Pulse- Standing at 0 minutes  10/09/20 1320 155/90 72 (!) 159/96 80 (!) 153/97 90    Updated Vital Signs BP 139/88 (BP Location: Left Arm) Comment: 158/103  R arm-will retake  Pulse 78   Temp 98 F (36.7 C) (Oral)   Resp 15   Ht 5\' 7"  (1.702 m)   Wt 191 lb (86.6 kg)   SpO2 100%   BMI 29.91 kg/m   Visual Acuity Right Eye Distance:   Left Eye Distance:   Bilateral Distance:    Right Eye Near:   Left Eye Near:    Bilateral Near:     Physical Exam Vitals and nursing note reviewed.  Constitutional:      General: She is not in acute distress.    Appearance: Normal appearance. She is well-developed. She is not ill-appearing, toxic-appearing or diaphoretic.  HENT:     Head: Normocephalic and atraumatic.     Right Ear: Tympanic membrane and ear canal normal.     Left Ear: Tympanic membrane and ear canal normal.     Nose: Nose normal.     Mouth/Throat:     Mouth: Mucous membranes are moist.     Pharynx: Oropharynx is clear.  Eyes:     Extraocular Movements: Extraocular movements intact.     Conjunctiva/sclera: Conjunctivae normal.     Pupils: Pupils are equal, round, and reactive to light.  Cardiovascular:     Rate and Rhythm: Normal rate and regular rhythm.  Pulmonary:     Effort: Pulmonary effort is normal. No respiratory distress.     Breath sounds: Normal breath sounds.  Musculoskeletal:        General: Normal range of motion.     Cervical back: Normal range of motion and neck supple.  Skin:    General: Skin is warm and dry.     Capillary Refill: Capillary refill takes less than 2 seconds.  Neurological:     General: No focal deficit present.     Mental Status: She is alert and oriented to person, place, and time.  Psychiatric:        Behavior: Behavior  normal.      UC Treatments / Results  Labs (all labs ordered are listed, but only abnormal results are displayed) Labs Reviewed - No data to display  EKG Date/Time:10/09/20    13:14:04  Ventricular Rate: 82 PR Interval: 134 QRS Duration: 84 QT Interval: 376 QTC Calculation: 439 P-R-T axes: 68   60   45 Text Interpretation: Normal sinus rhythm, Right atrial enlargement, nonspecific ST abnormality.  Abnormal ECG  No change from ECG in 2019   While attached to the ECG, rhythm strip printed in triage shows once PVC.   Radiology No results found.  Procedures Procedures (including critical care time)  Medications Ordered in UC Medications - No data to display  Initial Impression / Assessment and Plan / UC Course  I have reviewed the triage vital signs and the nursing notes.  Pertinent labs & imaging results that were available during my care of the patient were reviewed by me and considered in my medical decision making (see chart for details).    Reassured pt of no change to her EKG Only one PVC noted on rhythm strip from triage Discussed ways to help limit occasional PVCs Encouraged to f/u with PCP, may need to repeat a Holter monitor. Discussed symptoms that warrant emergent care in the ED. AVS given  Final Clinical  Impressions(s) / UC Diagnoses   Final diagnoses:  Palpitations  Dizziness  PVC (premature ventricular contraction)     Discharge Instructions      Please call to schedule an appointment with your primary care provider this week.  You may need a referral back to your cardiologist and another holter monitor to monitor your heart rate and beats over 24 hours or longer.    Call 911 or have someone drive you to the hospital if symptoms significantly worsening including worsening dizziness or developing chest pain, headache, difficulty breathing, or other new concerning symptoms develop./     ED Prescriptions    None     PDMP not  reviewed this encounter.   Noe Gens, PA-C 10/09/20 1429

## 2020-10-09 NOTE — ED Triage Notes (Signed)
Heart palpitations for a couple of days w/ increased dizziness today walking around Denies chest pain or SOB Pt has eaten today but has only has unsweetened tea and coffee to drink today Pt is on amlodipine 10mg  - takes at night  Plans on making an appointment w/ PCP this week to be seen for B/P NO COVID vaccine

## 2020-10-09 NOTE — Discharge Instructions (Addendum)
  Please call to schedule an appointment with your primary care provider this week.  You may need a referral back to your cardiologist and another holter monitor to monitor your heart rate and beats over 24 hours or longer.    Call 911 or have someone drive you to the hospital if symptoms significantly worsening including worsening dizziness or developing chest pain, headache, difficulty breathing, or other new concerning symptoms develop./

## 2020-10-16 ENCOUNTER — Encounter (INDEPENDENT_AMBULATORY_CARE_PROVIDER_SITE_OTHER): Payer: Self-pay

## 2020-10-26 ENCOUNTER — Ambulatory Visit (INDEPENDENT_AMBULATORY_CARE_PROVIDER_SITE_OTHER): Payer: BC Managed Care – PPO | Admitting: Physician Assistant

## 2020-10-26 VITALS — BP 156/86 | HR 92 | Ht 67.0 in | Wt 198.0 lb

## 2020-10-26 DIAGNOSIS — R748 Abnormal levels of other serum enzymes: Secondary | ICD-10-CM

## 2020-10-26 DIAGNOSIS — I493 Ventricular premature depolarization: Secondary | ICD-10-CM

## 2020-10-26 DIAGNOSIS — F5101 Primary insomnia: Secondary | ICD-10-CM | POA: Diagnosis not present

## 2020-10-26 MED ORDER — ZOLPIDEM TARTRATE 10 MG PO TABS: 10.0000 mg | ORAL_TABLET | Freq: Every evening | ORAL | 5 refills | Status: DC | PRN

## 2020-10-26 MED ORDER — METOPROLOL SUCCINATE ER 25 MG PO TB24: 25.0000 mg | ORAL_TABLET | Freq: Every day | ORAL | 2 refills | Status: DC

## 2020-10-26 NOTE — Progress Notes (Signed)
Subjective:    Patient ID: Kayla Hood, female    DOB: 1966/07/19, 54 y.o.   MRN: [id number]  HPI  Patient is a 54 year old female with depression, insomnia, palpitations who presents to the clinic to follow-up on worsening palpitations.  She has noticed over the past 2 weeks she has had more intermittent palpitations.  She has a history of these and has been evaluated by cardiologist and even had a Holter monitor with no significant findings.  She denies any headaches, chest pain, shortness of breath, cough, congestion, nausea vomiting or diarrhea.  She is on amlodipine 10 mg at bedtime.  She drinks very little caffeine.  She has had an echo done in 2014 which was normal.  She denies any swelling of extremities or problems laying flat.  Patient denies any abdominal pain or GI concerns.  Ambien controls her insomnia very well.  If she does not take it she does not sleep.   .. Active Ambulatory Problems    Diagnosis Date Noted  . Abnormal chest x-ray 03/03/2013  . Irregular heart beat 03/03/2013  . Sleep disturbance 03/03/2013  . General medical exam 03/03/2013  . Palpitations 03/21/2013  . Tendonitis of shoulder 09/01/2013  . Hives 01/26/2014  . RLQ abdominal pain 01/26/2014  . Facial edema 02/04/2014  . Anxiety state 06/07/2015  . Depression with anxiety 07/08/2015  . Endometriosis 07/08/2015  . Fibroid 07/08/2015  . Exposure to blood or body fluid 11/24/2013  . 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 Hidradenitis suppurativa 12/22/2015  . Hot flashes due to surgical menopause 12/22/2015  . Hyperlipidemia 12/22/2015  . Essential hypertension, benign 12/22/2015  . Vaginal dryness, menopausal 12/22/2015  . Anxiety 12/22/2015  . Screening for diabetes mellitus 12/22/2015  . Nodule of finger of both hands 08/18/2016  . Abnormal weight gain 11/18/2016  . Diverticulitis of large intestine without perforation or abscess 01/23/2017  . Ear itching 03/12/2017  . Major depression,  recurrent, chronic ([place]) 05/09/2017  . Lateral epicondylitis, left elbow 07/23/2017  . Numbness of left hand 09/03/2017  . Family history of early CAD 03/04/2018  . Primary insomnia 03/27/2018  . Overweight (BMI 25.0-29.9) 03/27/2018  . Degenerative disc disease, lumbar 05/16/2018  . Pre-diabetes 10/30/2018  . Class 1 obesity due to excess calories without serious comorbidity with body mass index (BMI) of 30.0 to 30.9 in adult 11/01/2018  . Primary osteoarthritis of left hip 05/08/2019  . Gallstones 12/31/2019  . Elevated LFTs 12/31/2019  . Bruise of breast 03/29/2020  . Erythematous papules of skin 03/30/2020  . Fullness of breast 03/30/2020  . Cervical radiculopathy 12/19/2017  . History of left tennis elbow 07/23/2017   Resolved Ambulatory Problems    Diagnosis Date Noted  . Left hip pain 06/28/2013  . Viral URI 03/09/2015   Past Medical History:  Diagnosis Date  . Allergy   . Chicken pox   . Depression   . Hypertension   . Incontinence of urine   . UTI (lower urinary tract infection)       Review of Systems  All other systems reviewed and are negative.      Objective:   Physical Exam Vitals reviewed.  Constitutional:      Appearance: Normal appearance.  HENT:     Head: Normocephalic.  Neck:     Vascular: No carotid bruit.  Cardiovascular:     Rate and Rhythm: Normal rate and regular rhythm.     Pulses: Normal pulses.     Comments: With frequent PVCs.  Pulmonary:     Effort: Pulmonary effort is normal.     Breath sounds: Normal breath sounds.  Musculoskeletal:     Right lower leg: No edema.     Left lower leg: No edema.  Neurological:     General: No focal deficit present.     Mental Status: She is alert and oriented to person, place, and time.  Psychiatric:        Mood and Affect: Mood normal.           Assessment & Plan:  Marland KitchenMarland KitchenVidalia was seen today for palpitations.  Diagnoses and all orders for this visit:  PVC (premature ventricular  contraction) -     TSH -     Fe+TIBC+Fer -     COMPLETE METABOLIC PANEL WITH GFR -     metoprolol succinate (TOPROL-XL) 25 MG 24 hr tablet; Take 1 tablet (25 mg total) by mouth daily.  Primary insomnia -     zolpidem (AMBIEN) 10 MG tablet; Take 1 tablet (10 mg total) by mouth at bedtime as needed for sleep.  Elevated alkaline phosphatase level -     Hepatic function panel   Hx of ALT elevation. Recheck with hepatic panel.   Unclear etiology of palpitation triggers.  Patient does not appear to drink too much caffeine and her mood is controlled.  Certainly not sleeping could trigger some palpitations.  She has had numerous work-ups in the past. Recheck tSH/CBC/ferritin for [facility] causes.  Echo done 2014 normal.  Reviewed EKG done 10/16 no acute changes.  Added metroprolol. Follow up in 3 months or sooner if needed.  HO given for potential triggers.  No cardiology referral made today.   Discussed sleep and how it can affect PVCs/palpitations.  Make sure you are sleeping.  Right now I would not advise to go off Ambien.  We could consider this in the future and work tomorrow sleep hygiene as you wish.  Spent 30 minutes with patient discussing plan.

## 2020-10-26 NOTE — Patient Instructions (Signed)
Premature Ventricular Contraction  A premature ventricular contraction (PVC) is a common kind of irregular heartbeat (arrhythmia). These contractions are extra heartbeats that start in the ventricles of the heart and occur too early in the normal sequence. During the PVC, the heart's normal electrical pathway is not used, so the beat is shorter and less effective. In most cases, these contractions come and go and do not require treatment. What are the causes? Common causes of the condition include:  Smoking.  Drinking alcohol.  Certain medicines.  Some illegal drugs.  Stress.  Caffeine. Certain medical conditions can also cause PVCs:  Heart failure.  Heart attack, or coronary artery disease.  Heart valve problems.  Changes in minerals in the blood (electrolytes).  Low blood oxygen levels or high carbon dioxide levels. In many cases, the cause of this condition is not known. What are the signs or symptoms? The main symptom of this condition is fast or skipped heartbeats (palpitations). Other symptoms include:  Chest pain.  Shortness of breath.  Feeling tired.  Dizziness.  Difficulty exercising. In some cases, there are no symptoms. How is this diagnosed? This condition may be diagnosed based on:  Your medical history.  A physical exam. During the exam, the health care provider will check for irregular heartbeats.  Tests, such as: ? An ECG (electrocardiogram) to monitor the electrical activity of your heart. ? An ambulatory cardiac monitor. This device records your heartbeats for 24 hours or more. ? Stress tests to see how exercise affects your heart rhythm and blood supply. ? An echocardiogram. This test uses sound waves (ultrasound) to produce an image of your heart. ? An electrophysiology study (EPS). This test checks for electrical problems in your heart. How is this treated? Treatment for this condition depends on any underlying conditions, the type of PVCs  that you are having, and how much the symptoms are interfering with your daily life. Possible treatments include:  Avoiding things that cause premature contractions (triggers). These include caffeine and alcohol.  Taking medicines if symptoms are severe or if the extra heartbeats are frequent.  Getting treatment for underlying conditions that cause PVCs.  Having an implantable cardioverter defibrillator (ICD), if you are at risk for a serious arrhythmia. The ICD is a small device that is inserted into your chest to monitor your heartbeat. When it senses an irregular heartbeat, it sends a shock to bring the heartbeat back to normal.  Having a procedure to destroy the portion of the heart tissue that sends out abnormal signals (catheter ablation). In some cases, no treatment is required. Follow these instructions at home: Lifestyle  Do not use any products that contain nicotine or tobacco, such as cigarettes, e-cigarettes, and chewing tobacco. If you need help quitting, ask your health care provider.  Do not use illegal drugs.  Exercise regularly. Ask your health care provider what type of exercise is safe for you.  Try to get at least 7-9 hours of sleep each night, or as much as recommended by your health care provider.  Find healthy ways to manage stress. Avoid stressful situations when possible. Alcohol use  Do not drink alcohol if: ? Your health care provider tells you not to drink. ? You are pregnant, may be pregnant, or are planning to become pregnant. ? Alcohol triggers your episodes.  If you drink alcohol: ? Limit how much you use to:  0-1 drink a day for women.  0-2 drinks a day for men.  Be aware of how much  alcohol is in your drink. In the [place] one drink equals one 12 oz bottle of beer (355 mL), one 5 oz glass of wine (148 mL), or one 1 oz glass of hard liquor (44 mL). General instructions  Take over-the-counter and prescription medicines only as told by your  health care provider.  If caffeine triggers episodes of PVC, do not eat, drink, or use anything with caffeine in it.  Keep all follow-up visits as told by your health care provider. This is important. Contact a health care provider if you:  Feel palpitations. Get help right away if you:  Have chest pain.  Have shortness of breath.  Have sweating for no reason.  Have nausea and vomiting.  Become light-headed or you faint. Summary  A premature ventricular contraction (PVC) is a common kind of irregular heartbeat (arrhythmia).  In most cases, these contractions come and go and do not require treatment.  You may need to wear an ambulatory cardiac monitor. This records your heartbeats for 24 hours or more.  Treatment depends on any underlying conditions, the type of PVCs that you are having, and how much the symptoms are interfering with your daily life. This information is not intended to replace advice given to you by your health care provider. Make sure you discuss any questions you have with your health care provider. Document Revised: 09/05/2018 Document Reviewed: 09/05/2018 Elsevier Patient Education  2020 [employer].

## 2020-10-27 LAB — COMPLETE METABOLIC PANEL WITH GFR
AG Ratio: 2 (calc) (ref 1.0–2.5)
ALT: 15 U/L (ref 6–29)
AST: 16 U/L (ref 10–35)
Albumin: 4.5 g/dL (ref 3.6–5.1)
Alkaline phosphatase (APISO): 109 U/L (ref 37–153)
BUN: 16 mg/dL (ref 7–25)
CO2: 26 mmol/L (ref 20–32)
Calcium: 9.8 mg/dL (ref 8.6–10.4)
Chloride: 105 mmol/L (ref 98–110)
Creat: 0.7 mg/dL (ref 0.50–1.05)
GFR, Est African American: 116 mL/min/{1.73_m2} (ref >=60)
GFR, Est Non African American: 100 mL/min/{1.73_m2} (ref >=60)
Globulin: 2.2 g/dL (calc) (ref 1.9–3.7)
Glucose, Bld: 105 mg/dL (ref 65–139)
Potassium: 4.1 mmol/L (ref 3.5–5.3)
Sodium: 139 mmol/L (ref 135–146)
Total Bilirubin: 0.4 mg/dL (ref 0.2–1.2)
Total Protein: 6.7 g/dL (ref 6.1–8.1)

## 2020-10-27 LAB — IRON,TIBC AND FERRITIN PANEL
%SAT: 11 % (calc) — ABNORMAL LOW (ref 16–45)
Ferritin: 39 ng/mL (ref 16–232)
Iron: 44 ug/dL — ABNORMAL LOW (ref 45–160)
TIBC: 394 mcg/dL (calc) (ref 250–450)

## 2020-10-27 LAB — HEPATIC FUNCTION PANEL
AG Ratio: 2 (calc) (ref 1.0–2.5)
ALT: 15 U/L (ref 6–29)
AST: 16 U/L (ref 10–35)
Albumin: 4.5 g/dL (ref 3.6–5.1)
Alkaline phosphatase (APISO): 109 U/L (ref 37–153)
Bilirubin, Direct: 0.1 mg/dL (ref 0.0–0.2)
Globulin: 2.2 g/dL (calc) (ref 1.9–3.7)
Indirect Bilirubin: 0.3 mg/dL (calc) (ref 0.2–1.2)
Total Bilirubin: 0.4 mg/dL (ref 0.2–1.2)
Total Protein: 6.7 g/dL (ref 6.1–8.1)

## 2020-10-27 LAB — TSH: TSH: 0.65 mIU/L

## 2020-10-27 NOTE — Progress Notes (Signed)
Aarion,   Thyroid stable.  Alk phosphatase looks great.  Iron is low. Starting iron 383m twice a day for 1 month then decrease to twice a day and take with vitamin C.

## 2020-11-02 ENCOUNTER — Other Ambulatory Visit (HOSPITAL_COMMUNITY): Payer: Self-pay | Admitting: Psychiatry

## 2020-12-09 ENCOUNTER — Telehealth (INDEPENDENT_AMBULATORY_CARE_PROVIDER_SITE_OTHER): Payer: BC Managed Care – PPO | Admitting: Psychiatry

## 2020-12-09 ENCOUNTER — Encounter (HOSPITAL_COMMUNITY): Payer: Self-pay | Admitting: Psychiatry

## 2020-12-09 DIAGNOSIS — F339 Major depressive disorder, recurrent, unspecified: Secondary | ICD-10-CM | POA: Diagnosis not present

## 2020-12-09 DIAGNOSIS — F411 Generalized anxiety disorder: Secondary | ICD-10-CM

## 2020-12-09 NOTE — Progress Notes (Signed)
[facility] Outpatient Follow up visit  Tele psych visit Patient Identification: Kayla Hood MRN:  [id number] Date of Evaluation:  12/09/2020 Referral Source: 54  years old currently married Caucasian female initially referred by primary care office for management of depression and anxiety  Chief Complaint:    depression follow up  Visit Diagnosis:    ICD-10-CM   1. Major depression, recurrent, chronic (HCC)  F33.9   2. GAD (generalized anxiety disorder)  F41.1     History of Present Illness:     I connected with [name] on 12/09/20 at  3:00 PM EST by telephone and verified that I am speaking with the correct person using two identifiers.   I discussed the limitations, risks, security and privacy concerns of performing an evaluation and management service by telephone and the availability of in person appointments. I also discussed with the patient that there may be a patient responsible charge related to this service. The patient expressed understanding and agreed to proceed. Patient location: home Provider location: home office   Doing fair, takes ambien for sleep  Aggravating factors; [place] living. Denies any particular Modifying factors; keeps busy. son when he is at home  Severity of depression;better  [name], now gets from [facility]  Duration 4 plus years  Past Psychiatric History: depression, anxiety   Previous Psychotropic Medications: Yes  VIIbryd made her agitated.  zoloft didn't help later Substance Abuse History in the last 12 months:  No.  Consequences of Substance Abuse: NA  Past Medical History:  Past Medical History:  Diagnosis Date  . Allergy   . Chicken pox   . Depression   . Hypertension   . Incontinence of urine   . UTI (lower urinary tract infection)     Past Surgical History:  Procedure Laterality Date  . ABLATION    . CERVICAL FUSION  2012  . CHOLECYSTECTOMY    . pelvic laproscopic surgeries     4 prior to 2008    Family  Psychiatric History: Dad; alcohol use in past. Depression. MOm side has depression and some bipolar in family  Family History:  Family History  Problem Relation Age of Onset  . Hyperlipidemia Mother   . Ulcerative colitis Mother   . Heart disease Father 64       CAD  . Hypertension Father   . Diverticulitis Father   . Hypertension Brother   . Cancer Maternal Grandfather 24       Stomach Cancer  . Cancer Paternal Grandmother 48       Uterine Cancer  . Stroke Paternal Grandfather 44       CVA  . Cancer Maternal Grandmother     Social History:   Social History   Socioeconomic History  . Marital status: Married    Spouse name: Carolene Gitto  . Number of children: 1  . Years of education: 73  . Highest education level: Not on file  Occupational History  . 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 Smoking status: Never Smoker  . Smokeless tobacco: Never Used  Vaping Use  . Vaping Use: Never used  Substance and Sexual Activity  . Alcohol use: No    Alcohol/week: 2.0 standard drinks    Types: 2 Cans of beer per week    Comment: Occasionally   . Drug use: No  . Sexual activity: Yes    Birth control/protection: None  Other  Topics Concern  . Not on file  Social History Narrative  . Not on file   Social Determinants of Health   Financial Resource Strain: Not on file  Food Insecurity: Not on file  Transportation Needs: Not on file  Physical Activity: Not on file  Stress: Not on file  Social Connections: Not on file     Allergies:   Allergies  Allergen Reactions  . Promethazine Anaphylaxis and Other (See Comments)    Other Reaction: OTHER REACTION  . Phenergan [Promethazine Hcl] Rash    Metabolic Disorder Labs: Lab Results  Component Value Date   HGBA1C 5.9 (H) 08/13/2019   MPG 123 08/13/2019   MPG 120 10/29/2018   No results found for: PROLACTIN Lab Results  Component Value Date   CHOL  246 (H) 08/13/2019   TRIG 377 (H) 08/13/2019   HDL 43 (L) 08/13/2019   CHOLHDL 5.7 (H) 08/13/2019   VLDL 37 (H) 12/22/2015   LDLCALC 145 (H) 08/13/2019   LDLCALC 173 (H) 10/29/2018     Current Medications: Current Outpatient Medications  Medication Sig Dispense Refill  . amLODipine (NORVASC) 10 MG tablet TAKE 1 TABLET(10 MG) BY MOUTH DAILY 90 tablet 3  . escitalopram (LEXAPRO) 20 MG tablet TAKE 1 TABLET(20 MG) BY MOUTH EVERY DAY 30 tablet 0  . ibuprofen (ADVIL) 800 MG tablet Take 800 mg by mouth every 8 (eight) hours as needed.    . metoprolol succinate (TOPROL-XL) 25 MG 24 hr tablet Take 1 tablet (25 mg total) by mouth daily. 30 tablet 2  . omeprazole (PRILOSEC) 40 MG capsule Take 1 capsule (40 mg total) by mouth daily. 30 capsule 1  . zolpidem (AMBIEN) 10 MG tablet Take 1 tablet (10 mg total) by mouth at bedtime as needed for sleep. 30 tablet 5   No current facility-administered medications for this visit.      Psychiatric Specialty Exam: Review of Systems  Cardiovascular: Negative for chest pain.  Psychiatric/Behavioral: Negative for depression and suicidal ideas.    There were no vitals taken for this visit.There is no height or weight on file to calculate BMI.  General Appearance:   Eye Contact:    Speech:  Normal Rate  Volume:  Normal  Mood: fair   Affect:  constricted  Thought Process:  Goal Directed  Orientation:  Full (Time, Place, and Person)  Thought Content:  Rumination  Suicidal Thoughts:  No  Homicidal Thoughts:  No  Memory:  Immediate;   Fair Recent;   Fair  Judgement:  Fair  Insight:  Fair  Psychomotor Activity:  Normal  Concentration:  Concentration: Fair and Attention Span: Fair  Recall:  [employer] of Knowledge:Fair  Language: Fair  Akathisia:  Negative  Handed:  Right  AIMS (if indicated):    Assets:  Desire for Improvement  ADL's:  Intact  Cognition: WNL  Sleep:  Fair with meds    Treatment Plan Summary: Medication management and Plan  as follows  1. Major depression, recurrent , moderate: remains stable, continue lexapro 2. GAD: manageable on lexapro continue 3. Insomnia: now on ambien , it helps. Gets from primary care  Fu 75m. Call for refills when due.  I discussed the assessment and treatment plan with the patient. The patient was provided an opportunity to ask questions and all were answered. The patient agreed with the plan and demonstrated an understanding of the instructions.   The patient was advised to call back or seek an in-person evaluation if the symptoms  worsen or if the condition fails to improve as anticipated.  I provided 15 minutes or less  of non-face-to-face time during this encounter. Merian Capron, MD 12/16/20213:23 PM

## 2021-01-06 ENCOUNTER — Other Ambulatory Visit (HOSPITAL_COMMUNITY): Payer: Self-pay | Admitting: Psychiatry

## 2021-01-07 ENCOUNTER — Other Ambulatory Visit: Payer: Self-pay | Admitting: Physician Assistant

## 2021-01-07 DIAGNOSIS — Z1231 Encounter for screening mammogram for malignant neoplasm of breast: Secondary | ICD-10-CM

## 2021-01-21 ENCOUNTER — Other Ambulatory Visit: Payer: Self-pay | Admitting: Physician Assistant

## 2021-01-21 DIAGNOSIS — I493 Ventricular premature depolarization: Secondary | ICD-10-CM

## 2021-01-24 ENCOUNTER — Other Ambulatory Visit: Payer: Self-pay

## 2021-01-24 ENCOUNTER — Ambulatory Visit (INDEPENDENT_AMBULATORY_CARE_PROVIDER_SITE_OTHER): Payer: BC Managed Care – PPO | Admitting: Physician Assistant

## 2021-01-24 VITALS — BP 140/84 | HR 78 | Ht 67.0 in | Wt 203.0 lb

## 2021-01-24 DIAGNOSIS — R748 Abnormal levels of other serum enzymes: Secondary | ICD-10-CM | POA: Insufficient documentation

## 2021-01-24 DIAGNOSIS — E785 Hyperlipidemia, unspecified: Secondary | ICD-10-CM

## 2021-01-24 DIAGNOSIS — R079 Chest pain, unspecified: Secondary | ICD-10-CM | POA: Diagnosis not present

## 2021-01-24 DIAGNOSIS — I493 Ventricular premature depolarization: Secondary | ICD-10-CM | POA: Diagnosis not present

## 2021-01-24 DIAGNOSIS — Z8249 Family history of ischemic heart disease and other diseases of the circulatory system: Secondary | ICD-10-CM

## 2021-01-24 HISTORY — DX: Abnormal levels of other serum enzymes: R74.8

## 2021-01-24 MED ORDER — METOPROLOL SUCCINATE ER 25 MG PO TB24: ORAL_TABLET | ORAL | 2 refills | Status: DC

## 2021-01-24 NOTE — Progress Notes (Signed)
    10 minutes morning not with exertion No SOB ASA   Digestive health.   Alk phos 136.  MR of liver   .Marland Kitchen Family History  Problem Relation Age of Onset  . Hyperlipidemia Mother   . Ulcerative colitis Mother   . Heart disease Father 57       CAD  . Hypertension Father   . Diverticulitis Father   . Hypertension Brother   . Cancer Maternal Grandfather 71       Stomach Cancer  . Cancer Paternal Grandmother 75       Uterine Cancer  . Stroke Paternal Grandfather 73       CVA  . Cancer Maternal Grandmother

## 2021-01-25 ENCOUNTER — Encounter: Payer: Self-pay | Admitting: Physician Assistant

## 2021-01-25 DIAGNOSIS — R079 Chest pain, unspecified: Secondary | ICD-10-CM

## 2021-01-25 DIAGNOSIS — I493 Ventricular premature depolarization: Secondary | ICD-10-CM | POA: Insufficient documentation

## 2021-01-25 HISTORY — DX: Ventricular premature depolarization: I49.3

## 2021-01-25 HISTORY — DX: Chest pain, unspecified: R07.9

## 2021-01-25 NOTE — Progress Notes (Signed)
Subjective:    Patient ID: Kayla Hood, female    DOB: 03-28-1966, 55 y.o.   MRN: [id number]  HPI  Pt is a 55 yo female with HTN, Palpitations, dyslipidemia and family hx of early CAD who presents to the clinic with concerns after episode of left sided chest pain on Friday, 4 days ago. She was at work but not exerting. She noticed it come on suddenly and start in sternum and then wrap around into left shoulder and down left arm. It last about 10 minutes and then resolved. She took a ASA and waiting. She denies any GERD like symptoms or cause for panic attack. She is concerned about her heart. Not happened since. She has declined statin in the past. She does not smoke. No melena or hematochezia and has had close work up with GI recently. She has been on BB for palpitations. It does help. If she does not take it she feels them a lot more. Some days she has taken 2 of them.    .. Active Ambulatory Problems    Diagnosis Date Noted  . Abnormal chest x-ray 03/03/2013  . Irregular heart beat 03/03/2013  . Sleep disturbance 03/03/2013  . General medical exam 03/03/2013  . Palpitations 03/21/2013  . Tendonitis of shoulder 09/01/2013  . Hives 01/26/2014  . RLQ abdominal pain 01/26/2014  . Facial edema 02/04/2014  . Anxiety state 06/07/2015  . Depression with anxiety 07/08/2015  . Endometriosis 07/08/2015  . Fibroid 07/08/2015  . Exposure to blood or body fluid 11/24/2013  . 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 Hidradenitis suppurativa 12/22/2015  . Hot flashes due to surgical menopause 12/22/2015  . Dyslipidemia (high LDL; low HDL) 12/22/2015  . Essential hypertension, benign 12/22/2015  . Vaginal dryness, menopausal 12/22/2015  . Anxiety 12/22/2015  . Screening for diabetes mellitus 12/22/2015  . Nodule of finger of both hands 08/18/2016  . Abnormal weight gain 11/18/2016  . Diverticulitis of large intestine without perforation or abscess 01/23/2017  . Ear itching 03/12/2017  . Major  depression, recurrent, chronic ([place]) 05/09/2017  . Lateral epicondylitis, left elbow 07/23/2017  . Numbness of left hand 09/03/2017  . Family history of early CAD 03/04/2018  . Primary insomnia 03/27/2018  . Overweight (BMI 25.0-29.9) 03/27/2018  . Degenerative disc disease, lumbar 05/16/2018  . Pre-diabetes 10/30/2018  . Class 1 obesity due to excess calories without serious comorbidity with body mass index (BMI) of 30.0 to 30.9 in adult 11/01/2018  . Primary osteoarthritis of left hip 05/08/2019  . Gallstones 12/31/2019  . Elevated LFTs 12/31/2019  . Bruise of breast 03/29/2020  . Erythematous papules of skin 03/30/2020  . Fullness of breast 03/30/2020  . Cervical radiculopathy 12/19/2017  . History of left tennis elbow 07/23/2017  . Elevated alkaline phosphatase level 01/24/2021  . Left-sided chest pain 01/25/2021  . PVC (premature ventricular contraction) 01/25/2021   Resolved Ambulatory Problems    Diagnosis Date Noted  . Left hip pain 06/28/2013  . Viral URI 03/09/2015   Past Medical History:  Diagnosis Date  . Allergy   . Chicken pox   . Depression   . Hypertension   . Incontinence of urine   . UTI (lower urinary tract infection)     Review of Systems See HPI.     Objective:   Physical Exam Vitals reviewed.  Constitutional:      Appearance: Normal appearance. She is obese.  HENT:     Head: Normocephalic.  Neck:     Vascular:  No carotid bruit.  Cardiovascular:     Rate and Rhythm: Normal rate and regular rhythm.     Pulses: Normal pulses.  Pulmonary:     Effort: Pulmonary effort is normal.     Breath sounds: Normal breath sounds.  Chest:     Chest wall: No tenderness.  Musculoskeletal:     Right lower leg: No edema.     Left lower leg: No edema.  Psychiatric:        Mood and Affect: Mood normal.           Assessment & Plan:  Marland KitchenMarland KitchenKashay was seen today for palpitations.  Diagnoses and all orders for this visit:  Left-sided chest pain -      Ambulatory referral to Cardiology  PVC (premature ventricular contraction) -     metoprolol succinate (TOPROL-XL) 25 MG 24 hr tablet; Take 1-2 tablets daily. -     Basic metabolic panel -     Ambulatory referral to Cardiology  Dyslipidemia (high LDL; low HDL) -     Lipid Panel w/reflex Direct LDL -     Ambulatory referral to Cardiology  Elevated alkaline phosphatase level  Family history of early CAD -     Ambulatory referral to Cardiology   Pt does not have any CP today or since Friday. CP did not sound like cardiac chest pain. Pt denies any anxiety and does not feel like it was her stomach or reflux. She continues on omeprazole. Pt is higher risk of CV event due to her untreated dyslipidemia and family history. Pt is aware. EKG done 09/2020 no acute findings pt needs more imaging and work up. It has been a while since lipid was done so ordered today. Continue on metoprolol for palpitations. Ok to increase to 2 tablets to help better control rhythm. Discussed different ways the heart does not work properly. Will make referral to cardiology. Ok to take baby ASA daily. If having CP that is not resolving please go to UC or ED.

## 2021-02-07 ENCOUNTER — Other Ambulatory Visit (HOSPITAL_COMMUNITY): Payer: Self-pay | Admitting: Psychiatry

## 2021-02-07 DIAGNOSIS — R32 Unspecified urinary incontinence: Secondary | ICD-10-CM | POA: Insufficient documentation

## 2021-02-07 DIAGNOSIS — I10 Essential (primary) hypertension: Secondary | ICD-10-CM | POA: Insufficient documentation

## 2021-02-07 DIAGNOSIS — T7840XA Allergy, unspecified, initial encounter: Secondary | ICD-10-CM | POA: Insufficient documentation

## 2021-02-07 DIAGNOSIS — B019 Varicella without complication: Secondary | ICD-10-CM | POA: Insufficient documentation

## 2021-02-07 DIAGNOSIS — M79643 Pain in unspecified hand: Secondary | ICD-10-CM

## 2021-02-07 HISTORY — DX: Pain in unspecified hand: M79.643

## 2021-02-08 ENCOUNTER — Other Ambulatory Visit: Payer: Self-pay | Admitting: Physician Assistant

## 2021-02-08 ENCOUNTER — Ambulatory Visit (INDEPENDENT_AMBULATORY_CARE_PROVIDER_SITE_OTHER): Payer: BC Managed Care – PPO | Admitting: Cardiology

## 2021-02-08 ENCOUNTER — Encounter: Payer: Self-pay | Admitting: Cardiology

## 2021-02-08 ENCOUNTER — Other Ambulatory Visit: Payer: Self-pay

## 2021-02-08 ENCOUNTER — Ambulatory Visit (INDEPENDENT_AMBULATORY_CARE_PROVIDER_SITE_OTHER): Payer: BC Managed Care – PPO

## 2021-02-08 VITALS — BP 144/80 | HR 104 | Ht 67.0 in | Wt 203.0 lb

## 2021-02-08 DIAGNOSIS — I10 Essential (primary) hypertension: Secondary | ICD-10-CM | POA: Diagnosis not present

## 2021-02-08 DIAGNOSIS — R002 Palpitations: Secondary | ICD-10-CM

## 2021-02-08 DIAGNOSIS — E782 Mixed hyperlipidemia: Secondary | ICD-10-CM

## 2021-02-08 DIAGNOSIS — E669 Obesity, unspecified: Secondary | ICD-10-CM | POA: Diagnosis not present

## 2021-02-08 HISTORY — DX: Obesity, unspecified: E66.9

## 2021-02-08 HISTORY — DX: Mixed hyperlipidemia: E78.2

## 2021-02-08 NOTE — Progress Notes (Signed)
Cardiology Office Note:    Date:  02/08/2021   ID:  Kayla Hood, DOB [birth year], MRN [id number]  PCP:  Donella Stade, PA-C  Cardiologist:  Jenean Lindau, MD   Referring MD: Donella Stade, PA-C    ASSESSMENT:    1. Essential hypertension, benign   2. Palpitations   3. Mixed dyslipidemia   4. Obesity (BMI 30-39.9)    PLAN:    In order of problems listed above:  1. Primary prevention stressed with patient.  Importance of compliance with diet medication stressed and she vocalized understanding.  I advised her to walk on a regular basis in a graded fashion.  She is going to start walking on a regular basis gradually.  She tells me that her husband walks with her. 2. Essential hypertension: Blood pressure education was given lifestyle modification and salt intake issues were discussed and she promises to comply.  She will keep a track of her blood pressures. 3. Mixed dyslipidemia: Diet was emphasized.  Lipids were reviewed.  I will do a calcium scoring CT scan on her to consider lipid-lowering therapy. 4. Obesity: Weight reduction was stressed diet was emphasized and she promises to do better. 5. Cardiac murmur: Echocardiogram will be done to assess murmur heard on auscultation. 6. I told her to hold her beta-blocker because of palpitations.  Want to actually know what is going on with her and rhythm.  We will do 2-week monitoring which will impact the monitoring and I will see her in follow-up next Friday.  Further recommendations will be made based on the findings of the aforementioned test.  Patient had multiple questions which were answered to her satisfaction.   Medication Adjustments/Labs and Tests Ordered: Current medicines are reviewed at length with the patient today.  Concerns regarding medicines are outlined above.  No orders of the defined types were placed in this encounter.  No orders of the defined types were placed in this encounter.    History of Present  Illness:    Kayla Hood is a 55 y.o. female who is being seen today for the evaluation of palpitations at the request of Donella Stade, PA-C.  Patient is a pleasant 55 year old female.  She has past medical history of essential hypertension, mixed dyslipidemia and leads a sedentary lifestyle.  She is overweight.  She mentions to me that she has palpitations that are troubling to her.  She is used beta-blocker with some relief.  At the time of my evaluation, the patient is alert awake oriented and in no distress.  She has had an episode of chest tightness in the past but is not related to exertion.  She mentions to me that she has had no such episodes subsequently.  She is leads a sedentary lifestyle but active at work and otherwise active at life in general.  With activity she does not have any symptoms.  At the time of my evaluation, the patient is alert awake oriented and in no distress.  Past Medical History:  Diagnosis Date  . Abnormal chest x-ray 03/03/2013   Patient brings in her report of xray done in 10/2012 and 11/2012 which showed persistent abnormality in the left perihilar and infrahilar regions posteriorly. A follow up chest xray was recommended.  Formatting of this note might be different from the original. Formatting of this note might be different from the original. Patient brings in her report of xray done in 10/2012 and 11/2012 which showe  . Abnormal weight  gain 11/18/2016  . 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 Allergy   . 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 Anxiety 12/22/2015  . Anxiety state 06/07/2015  . Bruise of breast 03/29/2020  . Cervical radiculopathy 12/19/2017  . Chest tightness or pressure 02/24/2018  . Chicken pox   . Class 1 obesity due to excess calories without serious comorbidity with body mass index (BMI) of 30.0 to 30.9 in adult 11/01/2018  . Degenerative disc disease, lumbar 05/16/2018   Formatting of this note might be different from the original. Last  Assessment & Plan:  Formatting of this note might be different from the original. Acute mid low back pain. Likely discogenic, started conservatively, prednisone, Robaxin, formal PT. Thoracic and lumbar spine x-rays as the pain is near the thoracolumbar junction. She does have significant pain out of proportion to degree of palpatio  . Depression   . Depression with anxiety 07/08/2015   PHQ-9 was 3. GAD-7 was 1. 06/2016.   Formatting of this note might be different from the original. Overview:  PHQ-9 was 3. GAD-7 was 1. 06/2016.   Last Assessment & Plan:  Inc lexapro 20 mg qd  con't ativan  . Diverticulitis of large intestine without perforation or abscess 01/23/2017  . Dyslipidemia (high LDL; low HDL) 12/22/2015   10 year cardiovascular risk 5.5 percent 02/2018.   . Ear itching 03/12/2017  . Elevated alkaline phosphatase level 01/24/2021  . Elevated LFTs 12/31/2019  . Endometriosis 07/08/2015  . Erythematous papules of skin 03/30/2020  . Essential hypertension, benign 12/22/2015  . Exposure to blood or body fluid 11/24/2013   Last Assessment & Plan:  Suspect low risk exposure but post exposure labs pend for reassurance and repeat these in 3-6 months.   Formatting of this note might be different from the original. Formatting of this note might be different from the original. Last Assessment & Plan:  Suspect low risk exposure but post exposure labs pend for reassurance and repeat these in 3-6 months.  . Facial edema 02/04/2014  . Family history of early CAD 03/04/2018  . Fibroids 07/08/2015  . Fullness of breast 03/30/2020  . Gallstones 12/31/2019  . General medical exam 03/03/2013   Formatting of this note might be different from the original. Last Assessment & Plan:  Formatting of this note might be different from the original. Normal physical exam except for problems listed below. Labs ordered: cbc w/diff, cmet, lipid, TSH, vit D level (hx of deficiency). Also ordered EKG, chest xray to follow up on previously  noted abnormality. Cardiology evaluation.  . Hand pain 02/07/2021  . Hidradenitis suppurativa 12/22/2015  . History of left tennis elbow 07/23/2017   Formatting of this note might be different from the original. Last Assessment & Plan:  Failure of conservative measures, ultrasound guided injection as above.  Marland Kitchen History of potentially hazardous body fluid exposure 11/24/2013   Last Assessment & Plan:  Formatting of this note might be different from the original. Suspect low risk exposure but post exposure labs pend for reassurance and repeat these in 3-6 months. Formatting of this note might be different from the original. Last Assessment & Plan:  Formatting of this note might be different from the original. Suspect low risk exposure but post exposure labs pend for reas  . Hives 01/26/2014   Formatting of this note might be different from the original. Last Assessment & Plan:  Formatting of this note might be different from the original. Uncertain etiology. Start Zyrtec nightly. If no  improvement in 2 weeks will refer to Allergist.  . Hot flashes due to surgical menopause 12/22/2015  . Hypertension   . Incontinence of urine   . Irregular heart beat 03/03/2013   Formatting of this note might be different from the original. Last Assessment & Plan:  Patient feeling skipped beats which are increasing in frequency. Associated with feeling lightheaded, fatigued. Will check metabolic panel, TSH. Patient also reports that her husband says she snores. I am ordering a sleep study to r/o OSA which may cause irregular heart beat. EKG was done and shows a minimal ST   . Lateral epicondylitis, left elbow 07/23/2017  . Left elbow pain 04/28/2020   Formatting of this note might be different from the original. Added automatically from request for surgery [id number]  . Left hip pain 06/28/2013   Formatting of this note might be different from the original. Last Assessment & Plan:  Suspect this is tendinitis involving the iliopsoas  tendon. Send for left hip x-ray to rule out any structural abnormalities. Start anti-inflammatories such as ibuprofen 600 mg every 6 hours for the next 3-4 days, ice for 15 minutes 3-4 times a day for the next 3-4 days and rest. Once inflammation subsides recomm  . Left lateral epicondylitis 04/28/2020   Formatting of this note might be different from the original. Added automatically from request for surgery [id number]  . Left-sided chest pain 01/25/2021  . Major depression, recurrent, chronic ([place]) 05/09/2017  . Nodule of finger of both hands 08/18/2016  . Numbness of left hand 09/03/2017   Formatting of this note might be different from the original. Last Assessment & Plan:  C8 distribution versus ulnar nerve. She does have a history of a multilevel cervical fusion. Symptoms have been present for a long time now, it sounds as her neurosurgeon was planning a repeat MRI. I'm going to order a nerve conduction study, repeat x-rays, depending on what we see I may order an MRI with and wi  . OCD (obsessive compulsive disorder) 12/14/2017  . Open wound 11/24/2013   Formatting of this note might be different from the original. Last Assessment & Plan:  Formatting of this note might be different from the original. Keep clean and observe for sx of infection. Rx for Augmentin to begin prn infection. FU for any wound problems. Last Assessment & Plan:  Formatting of this note might be different from the original. Keep clean and observe for sx of infection. Rx for A  . Overweight (BMI 25.0-29.9) 03/27/2018  . Palpitations 03/21/2013   Formatting of this note might be different from the original. Last Assessment & Plan:  Pt has seen cardiology in past She was put on BB but it made her too tired She was given norvasc for bp but has not started it yet Probably related to recent increase in stress and anxiety  . Pre-diabetes 10/30/2018  . Primary insomnia 03/27/2018  . Primary osteoarthritis of left hip 05/08/2019   Formatting of  this note might be different from the original. Last Assessment & Plan:  Formatting of this note might be different from the original. Cecilie has 2 pain generators, the hip joint on the greater trochanteric bursa. We injected the left hip joint at the last visit, and her groin pain has completely resolved. She still has some lateral pain referrable to the bursa, hip abductors are weak  . PVC (premature ventricular contraction) 01/25/2021  . RLQ abdominal pain 01/26/2014   Formatting of this note might be  different from the original. Last Assessment & Plan:  RLQ tenderness with palpation and frequent nausea - concerning for appendicitis. Also, her report of symptoms point toward gallbladder. Recommend CT of abdomen/pelvis. Check cbc and cmet. She wanted to wait to have CT done on Wednesday. Instructed to call if symptoms worsen.  . Screening for diabetes mellitus 12/22/2015  . Shoulder pain 12/19/2017  . Sleep disturbance 03/03/2013   Formatting of this note might be different from the original. Last Assessment & Plan:  Formatting of this note might be different from the original. Re-signed [facility] since I could not find the one on file. She has been on Ambien for years and it is helpful 5 mg a night (halves her 10 mg). Will continue this.  . Tendonitis of shoulder 09/01/2013  . UTI (lower urinary tract infection)   . Vaginal dryness, menopausal 12/22/2015    Past Surgical History:  Procedure Laterality Date  . ABLATION    . CERVICAL FUSION  2012  . CHOLECYSTECTOMY    . pelvic laproscopic surgeries     4 prior to 2008    Current Medications: Current Meds  Medication Sig  . amLODipine (NORVASC) 10 MG tablet TAKE 1 TABLET(10 MG) BY MOUTH DAILY  . escitalopram (LEXAPRO) 20 MG tablet TAKE 1 TABLET(20 MG) BY MOUTH EVERY DAY  . ibuprofen (ADVIL) 800 MG tablet Take 800 mg by mouth every 8 (eight) hours as needed for mild pain.  . metoprolol succinate (TOPROL-XL) 25 MG 24 hr tablet Take 25-50 mg by mouth  daily.  Marland Kitchen omeprazole (PRILOSEC) 40 MG capsule Take 40 mg by mouth as needed (reflux).  . valACYclovir (VALTREX) 500 MG tablet Take 500 mg by mouth as needed. Cold sores  . zolpidem (AMBIEN) 10 MG tablet Take 1 tablet (10 mg total) by mouth at bedtime as needed for sleep.  . [DISCONTINUED] omeprazole (PRILOSEC) 40 MG capsule Take 1 capsule (40 mg total) by mouth daily. (Patient taking differently: Take 40 mg by mouth as needed.)     Allergies:   Promethazine and Phenergan [promethazine hcl]   Social History   Socioeconomic History  . Marital status: Married    Spouse name: Nayelie Gionfriddo  . Number of children: 1  . Years of education: 72  . Highest education level: Not on file  Occupational History  . 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 Smoking status: Never Smoker  . Smokeless tobacco: Never Used  Vaping Use  . Vaping Use: Never used  Substance and Sexual Activity  . Alcohol use: No    Alcohol/week: 2.0 standard drinks    Types: 2 Cans of beer per week    Comment: Occasionally   . Drug use: No  . Sexual activity: Yes    Birth control/protection: None  Other Topics Concern  . Not on file  Social History Narrative  . Not on file   Social Determinants of Health   Financial Resource Strain: Not on file  Food Insecurity: Not on file  Transportation Needs: Not on file  Physical Activity: Not on file  Stress: Not on file  Social Connections: Not on file     Family History: The patient's family history includes Cancer in her maternal grandmother; Cancer (age of onset: 12) in her maternal grandfather; Cancer (age of onset: 30) in her paternal grandmother; Diverticulitis in her father; Heart disease (age of onset: 58) in her father; Hyperlipidemia in her  mother; Hypertension in her brother and father; Stroke (age of onset: 20) in her paternal grandfather; Ulcerative colitis in her mother.  ROS:   Please see  the history of present illness.    All other systems reviewed and are negative.  EKGs/Labs/Other Studies Reviewed:    The following studies were reviewed today: EKG reveals sinus rhythm and nonspecific ST-T changes   Recent Labs: 10/26/2020: ALT 15; ALT 15; BUN 16; Creat 0.70; Potassium 4.1; Sodium 139; TSH 0.65  Recent Lipid Panel    Component Value Date/Time   CHOL 246 (H) 08/13/2019 1408   TRIG 377 (H) 08/13/2019 1408   HDL 43 (L) 08/13/2019 1408   CHOLHDL 5.7 (H) 08/13/2019 1408   VLDL 37 (H) 12/22/2015 0929   LDLCALC 145 (H) 08/13/2019 1408   LDLDIRECT 182.5 03/03/2013 1017    Physical Exam:    VS:  BP (!) 144/80   Pulse (!) 104   Ht 5\' 7"  (1.702 m)   Wt 203 lb (92.1 kg)   SpO2 99%   BMI 31.79 kg/m     Wt Readings from Last 3 Encounters:  02/08/21 203 lb (92.1 kg)  01/24/21 203 lb (92.1 kg)  10/26/20 198 lb (89.8 kg)     GEN: Patient is in no acute distress HEENT: Normal NECK: No JVD; No carotid bruits LYMPHATICS: No lymphadenopathy CARDIAC: S1 S2 regular, 2/6 systolic murmur at the apex. RESPIRATORY:  Clear to auscultation without rales, wheezing or rhonchi  ABDOMEN: Soft, non-tender, non-distended MUSCULOSKELETAL:  No edema; No deformity  SKIN: Warm and dry NEUROLOGIC:  Alert and oriented x 3 PSYCHIATRIC:  Normal affect    Signed, Jenean Lindau, MD  02/08/2021 3:06 PM    [facility]

## 2021-02-08 NOTE — Patient Instructions (Signed)
Medication Instructions:  No medication changes. *If you need a refill on your cardiac medications before your next appointment, please call your pharmacy*   Lab Work: None ordered If you have labs (blood work) drawn today and your tests are completely normal, you will receive your results only by: Marland Kitchen MyChart Message (if you have MyChart) OR . A paper copy in the mail If you have any lab test that is abnormal or we need to change your treatment, we will call you to review the results.   Testing/Procedures:  WHY IS MY DOCTOR PRESCRIBING ZIO? The Zio system is proven and trusted by physicians to detect and diagnose irregular heart rhythms - and has been prescribed to hundreds of thousands of patients.  The FDA has cleared the Zio system to monitor for many different kinds of irregular heart rhythms. In a study, physicians were able to reach a diagnosis 90% of the time with the Zio system1.  You can wear the Zio monitor - a small, discreet, comfortable patch - during your normal day-to-day activity, including while you sleep, shower, and exercise, while it records every single heartbeat for analysis.  1Barrett, P., et al. Comparison of 24 Hour Holter Monitoring Versus 14 Day Novel Adhesive Patch Electrocardiographic Monitoring. [place], 2014.  ZIO VS. HOLTER MONITORING The Zio monitor can be comfortably worn for up to 14 days. Holter monitors can be worn for 24 to 48 hours, limiting the time to record any irregular heart rhythms you may have. Zio is able to capture data for the 51% of patients who have their first symptom-triggered arrhythmia after 48 hours.1  LIVE WITHOUT RESTRICTIONS The Zio ambulatory cardiac monitor is a small, unobtrusive, and water-resistant patch-you might even forget you're wearing it. The Zio monitor records and stores every beat of your heart, whether you're sleeping, working out, or showering.  Wear the monitor for 2 weeks, remove  02/22/21.  We will order CT coronary calcium score. It will cost $99.00 and is not covered by insurance.  Please call [phone] to schedule.   [facility]  [address], [place] [address]    Follow-Up: At [facility], you and your health needs are our priority.  As part of our continuing mission to provide you with exceptional heart care, we have created designated Provider Care Teams.  These Care Teams include your primary Cardiologist (physician) and Advanced Practice Providers (APPs -  Physician Assistants and Nurse Practitioners) who all work together to provide you with the care you need, when you need it.  We recommend signing up for the patient portal called "MyChart".  Sign up information is provided on this After Visit Summary.  MyChart is used to connect with patients for Virtual Visits (Telemedicine).  Patients are able to view lab/test results, encounter notes, upcoming appointments, etc.  Non-urgent messages can be sent to your provider as well.   To learn more about what you can do with MyChart, go to [email or web address].    Your next appointment:   1 week(s)  The format for your next appointment:   In Person  Provider:   Jyl Heinz, MD   Other Instructions  Coronary Calcium Scan A coronary calcium scan is an imaging test used to look for deposits of plaque in the inner lining of the blood vessels of the heart (coronary arteries). Plaque is made up of calcium, protein, and fatty substances. These deposits of plaque can partly clog and narrow the coronary  arteries without producing any symptoms or warning signs. This puts a person at risk for a heart attack. This test is recommended for people who are at moderate risk for heart disease. The test can find plaque deposits before symptoms develop. Tell a health care provider about:  Any allergies you have.  All medicines you are taking, including vitamins, herbs, eye drops, creams, and  over-the-counter medicines.  Any problems you or family members have had with anesthetic medicines.  Any blood disorders you have.  Any surgeries you have had.  Any medical conditions you have.  Whether you are pregnant or may be pregnant. What are the risks? Generally, this is a safe procedure. However, problems may occur, including:  Harm to a pregnant woman and her unborn baby. This test involves the use of radiation. Radiation exposure can be dangerous to a pregnant woman and her unborn baby. If you are pregnant or think you may be pregnant, you should not have this procedure done.  Slight increase in the risk of cancer. This is because of the radiation involved in the test. What happens before the procedure? Ask your health care provider for any specific instructions on how to prepare for this procedure. You may be asked to avoid products that contain caffeine, tobacco, or nicotine for 4 hours before the procedure. What happens during the procedure?  You will undress and remove any jewelry from your neck or chest.  You will put on a hospital gown.  Sticky electrodes will be placed on your chest. The electrodes will be connected to an electrocardiogram (ECG) machine to record a tracing of the electrical activity of your heart.  You will lie down on a curved bed that is attached to the Victor.  You may be given medicine to slow down your heart rate so that clear pictures can be created.  You will be moved into the CT scanner, and the CT scanner will take pictures of your heart. During this time, you will be asked to lie still and hold your breath for 2-3 seconds at a time while each picture of your heart is being taken. The procedure may vary among health care providers and hospitals.   What happens after the procedure?  You can get dressed.  You can return to your normal activities.  It is up to you to get the results of your procedure. Ask your health care provider, or  the department that is doing the procedure, when your results will be ready. Summary  A coronary calcium scan is an imaging test used to look for deposits of plaque in the inner lining of the blood vessels of the heart (coronary arteries). Plaque is made up of calcium, protein, and fatty substances.  Generally, this is a safe procedure. Tell your health care provider if you are pregnant or may be pregnant.  Ask your health care provider for any specific instructions on how to prepare for this procedure.  A CT scanner will take pictures of your heart.  You can return to your normal activities after the scan is done. This information is not intended to replace advice given to you by your health care provider. Make sure you discuss any questions you have with your health care provider. Document Revised: 07/01/2019 Document Reviewed: 07/01/2019 Elsevier Patient Education  [place].

## 2021-02-16 ENCOUNTER — Encounter: Payer: Self-pay | Admitting: Physician Assistant

## 2021-02-16 ENCOUNTER — Ambulatory Visit: Payer: Self-pay

## 2021-02-18 ENCOUNTER — Ambulatory Visit: Payer: BC Managed Care – PPO | Admitting: Cardiology

## 2021-02-24 ENCOUNTER — Ambulatory Visit: Payer: Self-pay

## 2021-03-03 ENCOUNTER — Ambulatory Visit (INDEPENDENT_AMBULATORY_CARE_PROVIDER_SITE_OTHER): Payer: BC Managed Care – PPO

## 2021-03-03 ENCOUNTER — Other Ambulatory Visit: Payer: Self-pay

## 2021-03-03 DIAGNOSIS — Z1231 Encounter for screening mammogram for malignant neoplasm of breast: Secondary | ICD-10-CM | POA: Diagnosis not present

## 2021-03-07 NOTE — Progress Notes (Signed)
Possible mass in right breast. You should be called by imaging for more films to better evaluate.

## 2021-03-08 ENCOUNTER — Other Ambulatory Visit: Payer: Self-pay | Admitting: Physician Assistant

## 2021-03-08 DIAGNOSIS — R928 Other abnormal and inconclusive findings on diagnostic imaging of breast: Secondary | ICD-10-CM

## 2021-03-09 DIAGNOSIS — F064 Anxiety disorder due to known physiological condition: Secondary | ICD-10-CM

## 2021-03-09 DIAGNOSIS — M545 Low back pain, unspecified: Secondary | ICD-10-CM | POA: Insufficient documentation

## 2021-03-09 DIAGNOSIS — IMO0002 Reserved for concepts with insufficient information to code with codable children: Secondary | ICD-10-CM

## 2021-03-09 DIAGNOSIS — G43909 Migraine, unspecified, not intractable, without status migrainosus: Secondary | ICD-10-CM

## 2021-03-09 DIAGNOSIS — N924 Excessive bleeding in the premenopausal period: Secondary | ICD-10-CM

## 2021-03-09 DIAGNOSIS — B009 Herpesviral infection, unspecified: Secondary | ICD-10-CM | POA: Insufficient documentation

## 2021-03-09 DIAGNOSIS — M533 Sacrococcygeal disorders, not elsewhere classified: Secondary | ICD-10-CM

## 2021-03-09 DIAGNOSIS — Z23 Encounter for immunization: Secondary | ICD-10-CM

## 2021-03-09 DIAGNOSIS — M5430 Sciatica, unspecified side: Secondary | ICD-10-CM

## 2021-03-09 DIAGNOSIS — C4490 Unspecified malignant neoplasm of skin, unspecified: Secondary | ICD-10-CM | POA: Insufficient documentation

## 2021-03-09 HISTORY — DX: Migraine, unspecified, not intractable, without status migrainosus: G43.909

## 2021-03-09 HISTORY — DX: Anxiety disorder due to known physiological condition: F06.4

## 2021-03-09 HISTORY — DX: Unspecified malignant neoplasm of skin, unspecified: C44.90

## 2021-03-09 HISTORY — DX: Low back pain, unspecified: M54.50

## 2021-03-09 HISTORY — DX: Encounter for immunization: Z23

## 2021-03-09 HISTORY — DX: Reserved for concepts with insufficient information to code with codable children: IMO0002

## 2021-03-09 HISTORY — DX: Sciatica, unspecified side: M54.30

## 2021-03-09 HISTORY — DX: Sacrococcygeal disorders, not elsewhere classified: M53.3

## 2021-03-09 HISTORY — DX: Herpesviral infection, unspecified: B00.9

## 2021-03-09 HISTORY — DX: Excessive bleeding in the premenopausal period: N92.4

## 2021-03-11 ENCOUNTER — Other Ambulatory Visit: Payer: Self-pay

## 2021-03-11 ENCOUNTER — Ambulatory Visit (INDEPENDENT_AMBULATORY_CARE_PROVIDER_SITE_OTHER)
Admission: RE | Admit: 2021-03-11 | Discharge: 2021-03-11 | Disposition: A | Discharge status: Home / Self Care | Payer: Self-pay | Source: Ambulatory Visit | Attending: Cardiology | Admitting: Cardiology

## 2021-03-11 ENCOUNTER — Encounter: Payer: Self-pay | Admitting: Cardiology

## 2021-03-11 ENCOUNTER — Ambulatory Visit (INDEPENDENT_AMBULATORY_CARE_PROVIDER_SITE_OTHER): Payer: BC Managed Care – PPO | Admitting: Cardiology

## 2021-03-11 VITALS — BP 138/82 | HR 78 | Ht 67.0 in | Wt 201.1 lb

## 2021-03-11 DIAGNOSIS — R002 Palpitations: Secondary | ICD-10-CM

## 2021-03-11 DIAGNOSIS — E785 Hyperlipidemia, unspecified: Secondary | ICD-10-CM

## 2021-03-11 DIAGNOSIS — E6609 Other obesity due to excess calories: Secondary | ICD-10-CM

## 2021-03-11 DIAGNOSIS — E782 Mixed hyperlipidemia: Secondary | ICD-10-CM

## 2021-03-11 DIAGNOSIS — I10 Essential (primary) hypertension: Secondary | ICD-10-CM

## 2021-03-11 DIAGNOSIS — E669 Obesity, unspecified: Secondary | ICD-10-CM

## 2021-03-11 DIAGNOSIS — Z6830 Body mass index (BMI) 30.0-30.9, adult: Secondary | ICD-10-CM

## 2021-03-11 NOTE — Progress Notes (Signed)
Cardiology Office Note:    Date:  03/11/2021   ID:  Kayla Hood, DOB 1966/02/24, MRN [id number]  PCP:  Donella Stade, PA-C  Cardiologist:  Jenean Lindau, MD   Referring MD: Donella Stade, PA-C    ASSESSMENT:    1. Essential hypertension, benign   2. Class 1 obesity due to excess calories without serious comorbidity with body mass index (BMI) of 30.0 to 30.9 in adult   3. Dyslipidemia (high LDL; low HDL)   4. Palpitations    PLAN:    In order of problems listed above:  1. Primary prevention stressed with the patient.  Importance of compliance with diet medication stressed and she vocalized understanding.  I congratulated her about her exercise program.  I told her to walk at least half an hour a day 5 days a week and she promises to do so. 2. Essential hypertension: Blood pressure stable and diet was emphasized.  Lifestyle modification was urged. 3. Obesity: Weight reduction was stressed with the patient and diet was emphasized. 4. CT scan of the chest for calcium scoring was done and we will get in touch with her when I have the results. 5. Palpitations: They have resolved significantly.  I reassured her after reviewing the findings of the event monitor with her at length and she was happy. 6. Patient will be seen in follow-up appointment in 6 months or earlier if the patient has any concerns    Medication Adjustments/Labs and Tests Ordered: Current medicines are reviewed at length with the patient today.  Concerns regarding medicines are outlined above.  No orders of the defined types were placed in this encounter.  No orders of the defined types were placed in this encounter.    No chief complaint on file.    History of Present Illness:    Kayla Hood is a 55 y.o. female.  Patient has past medical history of essential hypertension.  She was evaluated by me for palpitations also.  Her tests have been largely unremarkable.  No chest pain orthopnea or PND.   CT scan calcium scoring was done today and we are awaiting results.  Patient is here for follow-up.  She started walking a mile on a regular basis.  At the time of my evaluation, the patient is alert awake oriented and in no distress.  Past Medical History:  Diagnosis Date  . Abnormal chest x-ray 03/03/2013   Patient brings in her report of xray done in 10/2012 and 11/2012 which showed persistent abnormality in the left perihilar and infrahilar regions posteriorly. A follow up chest xray was recommended.  Formatting of this note might be different from the original. Formatting of this note might be different from the original. Patient brings in her report of xray done in 10/2012 and 11/2012 which showe  . Abnormal weight gain 11/18/2016  . 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 Allergy   . 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 Anxiety 12/22/2015  . Anxiety disorder due to medical condition 03/09/2021   Will restart zoloft per pt request.  F/u in 2-3 weeks.  Pt offered psych consult but declines.  . Anxiety state 06/07/2015  . Bruise of breast 03/29/2020  . Cervical radiculopathy 12/19/2017  . Chest tightness or pressure 02/24/2018  . Chicken pox   . Class 1 obesity due to excess calories without serious comorbidity with body mass index (BMI) of 30.0 to 30.9 in adult 11/01/2018  . Coccydynia 03/09/2021   no trauma,  on exam slightly prominent and recent weight loss probably increased pressure to site0 motrin q 8 x 2 weeks then prn - donut seat and activity modification. F/U prn- she declines rx - no pain  . Degenerative disc disease, lumbar 05/16/2018   Formatting of this note might be different from the original. Last Assessment & Plan:  Formatting of this note might be different from the original. Acute mid low back pain. Likely discogenic, started conservatively, prednisone, Robaxin, formal PT. Thoracic and lumbar spine x-rays as the pain is near the thoracolumbar junction. She does have significant  pain out of proportion to degree of palpatio  . Depression   . Depression with anxiety 07/08/2015   PHQ-9 was 3. GAD-7 was 1. 06/2016.   Formatting of this note might be different from the original. Overview:  PHQ-9 was 3. GAD-7 was 1. 06/2016.   Last Assessment & Plan:  Inc lexapro 20 mg qd  con't ativan  . Diverticulitis of large intestine without perforation or abscess 01/23/2017  . Dyslipidemia (high LDL; low HDL) 12/22/2015   10 year cardiovascular risk 5.5 percent 02/2018.   . Ear itching 03/12/2017  . Elevated alkaline phosphatase level 01/24/2021  . Elevated LFTs 12/31/2019  . Endometriosis 07/08/2015  . Erythematous papules of skin 03/30/2020  . Essential hypertension, benign 12/22/2015  . Exposure to blood or body fluid 11/24/2013   Last Assessment & Plan:  Suspect low risk exposure but post exposure labs pend for reassurance and repeat these in 3-6 months.   Formatting of this note might be different from the original. Formatting of this note might be different from the original. Last Assessment & Plan:  Suspect low risk exposure but post exposure labs pend for reassurance and repeat these in 3-6 months.  . Facial edema 02/04/2014  . Family history of early CAD 03/04/2018  . Fibroids 07/08/2015  . Fullness of breast 03/30/2020  . Gallstones 12/31/2019  . General medical exam 03/03/2013   Formatting of this note might be different from the original. Last Assessment & Plan:  Formatting of this note might be different from the original. Normal physical exam except for problems listed below. Labs ordered: cbc w/diff, cmet, lipid, TSH, vit D level (hx of deficiency). Also ordered EKG, chest xray to follow up on previously noted abnormality. Cardiology evaluation.  . Hand pain 02/07/2021  . Herpes simplex 03/09/2021   Discussed Tx. options. She would like to have valtrex, but would like to get it from a pharmacy outside this [facility].  Written Rx for Valtrex 1000mg  2 tabs bid x 1 day given. #4 with 3 RF.  Advised  her to take her medication today and then refill the Rx to ha  . Hidradenitis suppurativa 12/22/2015  . History of left tennis elbow 07/23/2017   Formatting of this note might be different from the original. Last Assessment & Plan:  Failure of conservative measures, ultrasound guided injection as above.  Marland Kitchen History of potentially hazardous body fluid exposure 11/24/2013   Last Assessment & Plan:  Formatting of this note might be different from the original. Suspect low risk exposure but post exposure labs pend for reassurance and repeat these in 3-6 months. Formatting of this note might be different from the original. Last Assessment & Plan:  Formatting of this note might be different from the original. Suspect low risk exposure but post exposure labs pend for reas  . Hives 01/26/2014   Formatting of this note might be different from the original. Last  Assessment & Plan:  Formatting of this note might be different from the original. Uncertain etiology. Start Zyrtec nightly. If no improvement in 2 weeks will refer to Allergist.  . Hot flashes due to surgical menopause 12/22/2015  . Hypertension   . Incontinence of urine   . Irregular heart beat 03/03/2013   Formatting of this note might be different from the original. Last Assessment & Plan:  Patient feeling skipped beats which are increasing in frequency. Associated with feeling lightheaded, fatigued. Will check metabolic panel, TSH. Patient also reports that her husband says she snores. I am ordering a sleep study to r/o OSA which may cause irregular heart beat. EKG was done and shows a minimal ST   . Lateral epicondylitis, left elbow 07/23/2017  . Left elbow pain 04/28/2020   Formatting of this note might be different from the original. Added automatically from request for surgery [id number]  . Left hip pain 06/28/2013   Formatting of this note might be different from the original. Last Assessment & Plan:  Suspect this is tendinitis involving the iliopsoas  tendon. Send for left hip x-ray to rule out any structural abnormalities. Start anti-inflammatories such as ibuprofen 600 mg every 6 hours for the next 3-4 days, ice for 15 minutes 3-4 times a day for the next 3-4 days and rest. Once inflammation subsides recomm  . Left lateral epicondylitis 04/28/2020   Formatting of this note might be different from the original. Added automatically from request for surgery [id number]  . Left-sided chest pain 01/25/2021  . Low back pain 03/09/2021  . Major depression, recurrent, chronic ([place]) 05/09/2017  . Malignant neoplasm of skin 03/09/2021   Placed consult for removal at [facility]  . Migraine headache 03/09/2021   Pt with hx of migraine ha needs refill of meds today  . Mixed dyslipidemia 02/08/2021  . Need for prophylactic vaccination and inoculation against influenza 03/09/2021  . Nodule of finger of both hands 08/18/2016  . Numbness of left hand 09/03/2017   Formatting of this note might be different from the original. Last Assessment & Plan:  C8 distribution versus ulnar nerve. She does have a history of a multilevel cervical fusion. Symptoms have been present for a long time now, it sounds as her neurosurgeon was planning a repeat MRI. I'm going to order a nerve conduction study, repeat x-rays, depending on what we see I may order an MRI with and wi  . Obesity (BMI 30-39.9) 02/08/2021  . OCD (obsessive compulsive disorder) 12/14/2017  . Open wound 11/24/2013   Formatting of this note might be different from the original. Last Assessment & Plan:  Formatting of this note might be different from the original. Keep clean and observe for sx of infection. Rx for Augmentin to begin prn infection. FU for any wound problems. Last Assessment & Plan:  Formatting of this note might be different from the original. Keep clean and observe for sx of infection. Rx for A  . Other dysfunctions of sleep stages or arousal from sleep 03/09/2021   Pt offered Lunest or change to non abuse med.   Pt declines.  Will write for in house meds as noted.  Pt to f/u. PRN  . Overweight (BMI 25.0-29.9) 03/27/2018  . Palpitations 03/21/2013   Formatting of this note might be different from the original. Last Assessment & Plan:  Pt has seen cardiology in past She was put on BB but it made her too tired She was given norvasc for bp but  has not started it yet Probably related to recent increase in stress and anxiety  . Pre-diabetes 10/30/2018  . Premenopausal menorrhagia 03/09/2021   HOT flashes- was on OCp prior- she dc'd has appt c GYN provider in a few weeks - also recently dc'd her zoloft- should restart zoloft for vasomotor sx will given ambien prn sleep difficulties see below - also pt to keep appt c GYN provider do not suggest altering hormones by me today as she is managed by specialist - discussed calcium also see below- avoid spicy foods and caffeine  . Primary insomnia 03/27/2018  . Primary osteoarthritis of left hip 05/08/2019   Formatting of this note might be different from the original. Last Assessment & Plan:  Formatting of this note might be different from the original. Aayliah has 2 pain generators, the hip joint on the greater trochanteric bursa. We injected the left hip joint at the last visit, and her groin pain has completely resolved. She still has some lateral pain referrable to the bursa, hip abductors are weak  . PVC (premature ventricular contraction) 01/25/2021  . RLQ abdominal pain 01/26/2014   Formatting of this note might be different from the original. Last Assessment & Plan:  RLQ tenderness with palpation and frequent nausea - concerning for appendicitis. Also, her report of symptoms point toward gallbladder. Recommend CT of abdomen/pelvis. Check cbc and cmet. She wanted to wait to have CT done on Wednesday. Instructed to call if symptoms worsen.  . Sciatica 03/09/2021   referral to ortho; lumbar x-ray pending  . Screening for diabetes mellitus 12/22/2015  . Shoulder pain 12/19/2017  .  Sleep disturbance 03/03/2013   Formatting of this note might be different from the original. Last Assessment & Plan:  Formatting of this note might be different from the original. Re-signed CSC since I could not find the one on file. She has been on Ambien for years and it is helpful 5 mg a night (halves her 10 mg). Will continue this.  . Tendonitis of shoulder 09/01/2013  . UTI (lower urinary tract infection)   . Vaginal dryness, menopausal 12/22/2015    Past Surgical History:  Procedure Laterality Date  . ABLATION    . CERVICAL FUSION  2012  . CHOLECYSTECTOMY    . pelvic laproscopic surgeries     4 prior to 2008    Current Medications: Current Meds  Medication Sig  . amLODipine (NORVASC) 10 MG tablet Take 1 tablet (10 mg total) by mouth daily.  Marland Kitchen escitalopram (LEXAPRO) 20 MG tablet Take 20 mg by mouth daily.  . metoprolol succinate (TOPROL-XL) 25 MG 24 hr tablet Take 25-50 mg by mouth daily.  Marland Kitchen zolpidem (AMBIEN) 10 MG tablet Take 1 tablet (10 mg total) by mouth at bedtime as needed for sleep.     Allergies:   Promethazine and Phenergan [promethazine hcl]   Social History   Socioeconomic History  . Marital status: Married    Spouse name: Kevia Zaucha  . Number of children: 1  . Years of education: 33  . Highest education level: Not on file  Occupational History  . 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 Smoking status: Never Smoker  . Smokeless tobacco: Never Used  Vaping Use  . Vaping Use: Never used  Substance and Sexual Activity  . Alcohol use: No    Alcohol/week: 2.0 standard drinks    Types: 2 Cans of beer per  week    Comment: Occasionally   . Drug use: No  . Sexual activity: Yes    Birth control/protection: None  Other Topics Concern  . Not on file  Social History Narrative  . Not on file   Social Determinants of Health   Financial Resource Strain: Not on file  Food Insecurity: Not on file   Transportation Needs: Not on file  Physical Activity: Not on file  Stress: Not on file  Social Connections: Not on file     Family History: The patient's family history includes Cancer in her maternal grandmother; Cancer (age of onset: 2) in her maternal grandfather; Cancer (age of onset: 54) in her paternal grandmother; Diverticulitis in her father; Heart disease (age of onset: 2) in her father; Hyperlipidemia in her mother; Hypertension in her brother and father; Stroke (age of onset: 77) in her paternal grandfather; Ulcerative colitis in her mother.  ROS:   Please see the history of present illness.    All other systems reviewed and are negative.  EKGs/Labs/Other Studies Reviewed:    The following studies were reviewed today: Study Highlights   Patch Wear Time:  12 days and 23 hours (2022-02-15T15:24:04-499 to 2022-02-28T14:58:29-0500)  Patient had a min HR of 50 bpm, max HR of 261 bpm, and avg HR of 81 bpm. Predominant underlying rhythm was Sinus Rhythm. 4 Supraventricular Tachycardia runs occurred, the run with the fastest interval lasting 7 beats with a max rate of 261 bpm, the  longest lasting 9 beats with an avg rate of 130 bpm. True duration of Supraventricular Tachycardia difficult to ascertain due to artifact. Supraventricular Tachycardia was detected within +/- 45 seconds of symptomatic patient event(s). Isolated SVEs were  rare (<1.0%), SVE Couplets were rare (<1.0%), and SVE Triplets were rare (<1.0%). Isolated VEs were rare (<1.0%, 1325), VE Couplets were rare (<1.0%, 19), and VE Triplets were rare (<1.0%, 1).  Impression: Unremarkable event monitor.  Rare brief atrial runs, PACs and PVCs were noted.  The longest atrial run was 7 beats.  At times patient felt some symptoms with this rhythm.    Recent Labs: 10/26/2020: ALT 15; ALT 15; BUN 16; Creat 0.70; Potassium 4.1; Sodium 139; TSH 0.65  Recent Lipid Panel    Component Value Date/Time   CHOL 246 (H)  08/13/2019 1408   TRIG 377 (H) 08/13/2019 1408   HDL 43 (L) 08/13/2019 1408   CHOLHDL 5.7 (H) 08/13/2019 1408   VLDL 37 (H) 12/22/2015 0929   LDLCALC 145 (H) 08/13/2019 1408   LDLDIRECT 182.5 03/03/2013 1017    Physical Exam:    VS:  BP 138/82   Pulse 78   Ht 5\' 7"  (1.702 m)   Wt 201 lb 1.3 oz (91.2 kg)   SpO2 98%   BMI 31.49 kg/m     Wt Readings from Last 3 Encounters:  03/11/21 201 lb 1.3 oz (91.2 kg)  02/08/21 203 lb (92.1 kg)  01/24/21 203 lb (92.1 kg)     GEN: Patient is in no acute distress HEENT: Normal NECK: No JVD; No carotid bruits LYMPHATICS: No lymphadenopathy CARDIAC: Hear sounds regular, 2/6 systolic murmur at the apex. RESPIRATORY:  Clear to auscultation without rales, wheezing or rhonchi  ABDOMEN: Soft, non-tender, non-distended MUSCULOSKELETAL:  No edema; No deformity  SKIN: Warm and dry NEUROLOGIC:  Alert and oriented x 3 PSYCHIATRIC:  Normal affect   Signed, Jenean Lindau, MD  03/11/2021 3:09 PM    [facility]

## 2021-03-11 NOTE — Patient Instructions (Signed)

## 2021-04-07 ENCOUNTER — Encounter (HOSPITAL_COMMUNITY): Payer: Self-pay | Admitting: Psychiatry

## 2021-04-07 ENCOUNTER — Telehealth (INDEPENDENT_AMBULATORY_CARE_PROVIDER_SITE_OTHER): Payer: BC Managed Care – PPO | Admitting: Psychiatry

## 2021-04-07 DIAGNOSIS — F339 Major depressive disorder, recurrent, unspecified: Secondary | ICD-10-CM | POA: Diagnosis not present

## 2021-04-07 DIAGNOSIS — F411 Generalized anxiety disorder: Secondary | ICD-10-CM | POA: Diagnosis not present

## 2021-04-07 NOTE — Progress Notes (Signed)
[facility] Outpatient Follow up visit  Tele psych visit Patient Identification: Kayla Hood MRN:  [id number] Date of Evaluation:  04/07/2021 Referral Source: pcp Chief Complaint:    depression follow up  Visit Diagnosis:    ICD-10-CM   1. Major depression, recurrent, chronic (HCC)  F33.9   2. GAD (generalized anxiety disorder)  F41.1    Virtual Visit via Telephone Note  I connected with CHARLEA NARDO on 04/07/21 at  3:00 PM EDT by telephone and verified that I am speaking with the correct person using two identifiers.  Location: Patient: home Provider: office   I discussed the limitations, risks, security and privacy concerns of performing an evaluation and management service by telephone and the availability of in person appointments. I also discussed with the patient that there may be a patient responsible charge related to this service. The patient expressed understanding and agreed to proceed.      I discussed the assessment and treatment plan with the patient. The patient was provided an opportunity to ask questions and all were answered. The patient agreed with the plan and demonstrated an understanding of the instructions.   The patient was advised to call back or seek an in-person evaluation if the symptoms worsen or if the condition fails to improve as anticipated.  I provided 12  minutes of non-face-to-face time during this encounter including documentation.    History of Present Illness:    Patient is doing fair and stable, less stress and wants to taper off lexapro. Already on 5mg  now for last one week Job stress low Relationship is fine Plans to lower ambien later by sleep clinic   Aggravating factors; country living. When son in college feels lonely Modifying factors; keeps busy.. son back from college  Severity of depression;better   Duration 4 plus years  Past Psychiatric History: depression, anxiety   Previous Psychotropic Medications: Yes  VIIbryd  made her agitated.  zoloft didn't help later Substance Abuse History in the last 12 months:  No.  Consequences of Substance Abuse: NA  Past Medical History:  Past Medical History:  Diagnosis Date  . Abnormal chest x-ray 03/03/2013   Patient brings in her report of xray done in 10/2012 and 11/2012 which showed persistent abnormality in the left perihilar and infrahilar regions posteriorly. A follow up chest xray was recommended.  Formatting of this note might be different from the original. Formatting of this note might be different from the original. Patient brings in her report of xray done in 10/2012 and 11/2012 which showe  . Abnormal weight gain 11/18/2016  . 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 Allergy   . 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 Anxiety 12/22/2015  . Anxiety disorder due to medical condition 03/09/2021   Will restart zoloft per pt request.  F/u in 2-3 weeks.  Pt offered psych consult but declines.  . Anxiety state 06/07/2015  . Bruise of breast 03/29/2020  . Cervical radiculopathy 12/19/2017  . Chest tightness or pressure 02/24/2018  . Chicken pox   . Class 1 obesity due to excess calories without serious comorbidity with body mass index (BMI) of 30.0 to 30.9 in adult 11/01/2018  . Coccydynia 03/09/2021   no trauma, on exam slightly prominent and recent weight loss probably increased pressure to site0 motrin q 8 x 2 weeks then prn - donut seat and activity modification. F/U prn- she declines rx - no pain  . Degenerative disc disease, lumbar 05/16/2018   Formatting of this note  might be different from the original. Last Assessment & Plan:  Formatting of this note might be different from the original. Acute mid low back pain. Likely discogenic, started conservatively, prednisone, Robaxin, formal PT. Thoracic and lumbar spine x-rays as the pain is near the thoracolumbar junction. She does have significant pain out of proportion to degree of palpatio  . Depression   .  Depression with anxiety 07/08/2015   PHQ-9 was 3. GAD-7 was 1. 06/2016.   Formatting of this note might be different from the original. Overview:  PHQ-9 was 3. GAD-7 was 1. 06/2016.   Last Assessment & Plan:  Inc lexapro 20 mg qd  con't ativan  . Diverticulitis of large intestine without perforation or abscess 01/23/2017  . Dyslipidemia (high LDL; low HDL) 12/22/2015   10 year cardiovascular risk 5.5 percent 02/2018.   . Ear itching 03/12/2017  . Elevated alkaline phosphatase level 01/24/2021  . Elevated LFTs 12/31/2019  . Endometriosis 07/08/2015  . Erythematous papules of skin 03/30/2020  . Essential hypertension, benign 12/22/2015  . Exposure to blood or body fluid 11/24/2013   Last Assessment & Plan:  Suspect low risk exposure but post exposure labs pend for reassurance and repeat these in 3-6 months.   Formatting of this note might be different from the original. Formatting of this note might be different from the original. Last Assessment & Plan:  Suspect low risk exposure but post exposure labs pend for reassurance and repeat these in 3-6 months.  . Facial edema 02/04/2014  . Family history of early CAD 03/04/2018  . Fibroids 07/08/2015  . Fullness of breast 03/30/2020  . Gallstones 12/31/2019  . General medical exam 03/03/2013   Formatting of this note might be different from the original. Last Assessment & Plan:  Formatting of this note might be different from the original. Normal physical exam except for problems listed below. Labs ordered: cbc w/diff, cmet, lipid, TSH, vit D level (hx of deficiency). Also ordered EKG, chest xray to follow up on previously noted abnormality. Cardiology evaluation.  . Hand pain 02/07/2021  . Herpes simplex 03/09/2021   Discussed Tx. options. She would like to have valtrex, but would like to get it from a pharmacy outside this [facility].  Written Rx for Valtrex 1000mg  2 tabs bid x 1 day given. #4 with 3 RF.  Advised her to take her medication today and then refill the Rx to ha  .  Hidradenitis suppurativa 12/22/2015  . History of left tennis elbow 07/23/2017   Formatting of this note might be different from the original. Last Assessment & Plan:  Failure of conservative measures, ultrasound guided injection as above.  Marland Kitchen History of potentially hazardous body fluid exposure 11/24/2013   Last Assessment & Plan:  Formatting of this note might be different from the original. Suspect low risk exposure but post exposure labs pend for reassurance and repeat these in 3-6 months. Formatting of this note might be different from the original. Last Assessment & Plan:  Formatting of this note might be different from the original. Suspect low risk exposure but post exposure labs pend for reas  . Hives 01/26/2014   Formatting of this note might be different from the original. Last Assessment & Plan:  Formatting of this note might be different from the original. Uncertain etiology. Start Zyrtec nightly. If no improvement in 2 weeks will refer to Allergist.  . Hot flashes due to surgical menopause 12/22/2015  . Hypertension   . Incontinence of urine   .  Irregular heart beat 03/03/2013   Formatting of this note might be different from the original. Last Assessment & Plan:  Patient feeling skipped beats which are increasing in frequency. Associated with feeling lightheaded, fatigued. Will check metabolic panel, TSH. Patient also reports that her husband says she snores. I am ordering a sleep study to r/o OSA which may cause irregular heart beat. EKG was done and shows a minimal ST   . Lateral epicondylitis, left elbow 07/23/2017  . Left elbow pain 04/28/2020   Formatting of this note might be different from the original. Added automatically from request for surgery [id number]  . Left hip pain 06/28/2013   Formatting of this note might be different from the original. Last Assessment & Plan:  Suspect this is tendinitis involving the iliopsoas tendon. Send for left hip x-ray to rule out any structural  abnormalities. Start anti-inflammatories such as ibuprofen 600 mg every 6 hours for the next 3-4 days, ice for 15 minutes 3-4 times a day for the next 3-4 days and rest. Once inflammation subsides recomm  . Left lateral epicondylitis 04/28/2020   Formatting of this note might be different from the original. Added automatically from request for surgery [id number]  . Left-sided chest pain 01/25/2021  . Low back pain 03/09/2021  . Major depression, recurrent, chronic ([place]) 05/09/2017  . Malignant neoplasm of skin 03/09/2021   Placed consult for removal at [facility]  . Migraine headache 03/09/2021   Pt with hx of migraine ha needs refill of meds today  . Mixed dyslipidemia 02/08/2021  . Need for prophylactic vaccination and inoculation against influenza 03/09/2021  . Nodule of finger of both hands 08/18/2016  . Numbness of left hand 09/03/2017   Formatting of this note might be different from the original. Last Assessment & Plan:  C8 distribution versus ulnar nerve. She does have a history of a multilevel cervical fusion. Symptoms have been present for a long time now, it sounds as her neurosurgeon was planning a repeat MRI. I'm going to order a nerve conduction study, repeat x-rays, depending on what we see I may order an MRI with and wi  . Obesity (BMI 30-39.9) 02/08/2021  . OCD (obsessive compulsive disorder) 12/14/2017  . Open wound 11/24/2013   Formatting of this note might be different from the original. Last Assessment & Plan:  Formatting of this note might be different from the original. Keep clean and observe for sx of infection. Rx for Augmentin to begin prn infection. FU for any wound problems. Last Assessment & Plan:  Formatting of this note might be different from the original. Keep clean and observe for sx of infection. Rx for A  . Other dysfunctions of sleep stages or arousal from sleep 03/09/2021   Pt offered Lunest or change to non abuse med.  Pt declines.  Will write for in house meds as noted.  Pt  to f/u. PRN  . Overweight (BMI 25.0-29.9) 03/27/2018  . Palpitations 03/21/2013   Formatting of this note might be different from the original. Last Assessment & Plan:  Pt has seen cardiology in past She was put on BB but it made her too tired She was given norvasc for bp but has not started it yet Probably related to recent increase in stress and anxiety  . Pre-diabetes 10/30/2018  . Premenopausal menorrhagia 03/09/2021   HOT flashes- was on OCp prior- she dc'd has appt c GYN provider in a few weeks - also recently dc'd her zoloft- should restart  zoloft for vasomotor sx will given ambien prn sleep difficulties see below - also pt to keep appt c GYN provider do not suggest altering hormones by me today as she is managed by specialist - discussed calcium also see below- avoid spicy foods and caffeine  . Primary insomnia 03/27/2018  . Primary osteoarthritis of left hip 05/08/2019   Formatting of this note might be different from the original. Last Assessment & Plan:  Formatting of this note might be different from the original. Teretha has 2 pain generators, the hip joint on the greater trochanteric bursa. We injected the left hip joint at the last visit, and her groin pain has completely resolved. She still has some lateral pain referrable to the bursa, hip abductors are weak  . PVC (premature ventricular contraction) 01/25/2021  . RLQ abdominal pain 01/26/2014   Formatting of this note might be different from the original. Last Assessment & Plan:  RLQ tenderness with palpation and frequent nausea - concerning for appendicitis. Also, her report of symptoms point toward gallbladder. Recommend CT of abdomen/pelvis. Check cbc and cmet. She wanted to wait to have CT done on Wednesday. Instructed to call if symptoms worsen.  . Sciatica 03/09/2021   referral to ortho; lumbar x-ray pending  . Screening for diabetes mellitus 12/22/2015  . Shoulder pain 12/19/2017  . Sleep disturbance 03/03/2013   Formatting of this note  might be different from the original. Last Assessment & Plan:  Formatting of this note might be different from the original. Re-signed [facility] since I could not find the one on file. She has been on Ambien for years and it is helpful 5 mg a night (halves her 10 mg). Will continue this.  . Tendonitis of shoulder 09/01/2013  . UTI (lower urinary tract infection)   . Vaginal dryness, menopausal 12/22/2015    Past Surgical History:  Procedure Laterality Date  . ABLATION    . CERVICAL FUSION  2012  . CHOLECYSTECTOMY    . pelvic laproscopic surgeries     4 prior to 2008    Family Psychiatric History: Dad; alcohol use in past. Depression. MOm side has depression and some bipolar in family  Family History:  Family History  Problem Relation Age of Onset  . Hyperlipidemia Mother   . Ulcerative colitis Mother   . Heart disease Father 56       CAD  . Hypertension Father   . Diverticulitis Father   . Hypertension Brother   . Cancer Maternal Grandfather 54       Stomach Cancer  . Cancer Paternal Grandmother 65       Uterine Cancer  . Stroke Paternal Grandfather 51       CVA  . Cancer Maternal Grandmother     Social History:   Social History   Socioeconomic History  . Marital status: Married    Spouse name: Shaely Gadberry  . Number of children: 1  . Years of education: 25  . Highest education level: Not on file  Occupational History  . 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 Smoking status: Never Smoker  . Smokeless tobacco: Never Used  Vaping Use  . Vaping Use: Never used  Substance and Sexual Activity  . Alcohol use: No    Alcohol/week: 2.0 standard drinks    Types: 2 Cans of beer per week    Comment: Occasionally   . Drug use: No  .  Sexual activity: Yes    Birth control/protection: None  Other Topics Concern  . Not on file  Social History Narrative  . Not on file   Social Determinants of Health    Financial Resource Strain: Not on file  Food Insecurity: Not on file  Transportation Needs: Not on file  Physical Activity: Not on file  Stress: Not on file  Social Connections: Not on file     Allergies:   Allergies  Allergen Reactions  . Promethazine Anaphylaxis and Other (See Comments)    Other Reaction: OTHER REACTION Other reaction(s): Unknown  . Phenergan [Promethazine Hcl] Rash    Metabolic Disorder Labs: Lab Results  Component Value Date   HGBA1C 5.9 (H) 08/13/2019   MPG 123 08/13/2019   MPG 120 10/29/2018   No results found for: PROLACTIN Lab Results  Component Value Date   CHOL 246 (H) 08/13/2019   TRIG 377 (H) 08/13/2019   HDL 43 (L) 08/13/2019   CHOLHDL 5.7 (H) 08/13/2019   VLDL 37 (H) 12/22/2015   LDLCALC 145 (H) 08/13/2019   LDLCALC 173 (H) 10/29/2018     Current Medications: Current Outpatient Medications  Medication Sig Dispense Refill  . amLODipine (NORVASC) 10 MG tablet Take 1 tablet (10 mg total) by mouth daily. 90 tablet 0  . escitalopram (LEXAPRO) 20 MG tablet Take 20 mg by mouth daily.    . metoprolol succinate (TOPROL-XL) 25 MG 24 hr tablet Take 25-50 mg by mouth daily.    Marland Kitchen zolpidem (AMBIEN) 10 MG tablet Take 1 tablet (10 mg total) by mouth at bedtime as needed for sleep. 30 tablet 5   No current facility-administered medications for this visit.      Psychiatric Specialty Exam: Review of Systems  Cardiovascular: Negative for chest pain.  Psychiatric/Behavioral: Negative for depression and suicidal ideas.    There were no vitals taken for this visit.There is no height or weight on file to calculate BMI.  General Appearance:   Eye Contact:    Speech:  Normal Rate  Volume:  Normal  Mood: good  Affect:    Thought Process:  Goal Directed  Orientation:  Full (Time, Place, and Person)  Thought Content:  Rumination  Suicidal Thoughts:  No  Homicidal Thoughts:  No  Memory:  Immediate;   Fair Recent;   Fair  Judgement:  Fair   Insight:  Fair  Psychomotor Activity:  Normal  Concentration:  Concentration: Fair and Attention Span: Fair  Recall:  [employer] of Knowledge:Fair  Language: Fair  Akathisia:  Negative  Handed:  Right  AIMS (if indicated):    Assets:  Desire for Improvement  ADL's:  Intact  Cognition: WNL  Sleep:  Fair with meds    Treatment Plan Summary: Medication management and Plan as follows  1. Major depression, recurrent , moderate: remission. And stable, can lower lexapro or taper off in one month Fu 42m call earlier if needed   2. GAD: stable, wants to taper off lexarpo, discussed risk and will review in 29months 3. Insomnia: on ambien, she plans to cut down in the next months and talk with sleep clinic how to do so  Fu 85m in office Merian Capron, MD 4/14/20223:10 PM

## 2021-04-11 ENCOUNTER — Other Ambulatory Visit: Payer: BC Managed Care – PPO

## 2021-04-12 ENCOUNTER — Other Ambulatory Visit: Payer: Self-pay | Admitting: Physician Assistant

## 2021-04-12 ENCOUNTER — Other Ambulatory Visit: Payer: Self-pay

## 2021-04-12 ENCOUNTER — Ambulatory Visit
Admission: RE | Admit: 2021-04-12 | Discharge: 2021-04-12 | Disposition: A | Discharge status: Home / Self Care | Payer: BC Managed Care – PPO | Source: Ambulatory Visit | Attending: Physician Assistant | Admitting: Physician Assistant

## 2021-04-12 ENCOUNTER — Ambulatory Visit: Payer: BC Managed Care – PPO

## 2021-04-12 DIAGNOSIS — R921 Mammographic calcification found on diagnostic imaging of breast: Secondary | ICD-10-CM

## 2021-04-12 DIAGNOSIS — R928 Other abnormal and inconclusive findings on diagnostic imaging of breast: Secondary | ICD-10-CM

## 2021-04-15 ENCOUNTER — Encounter (INDEPENDENT_AMBULATORY_CARE_PROVIDER_SITE_OTHER): Payer: Self-pay

## 2021-05-04 ENCOUNTER — Encounter: Payer: Self-pay | Admitting: Physician Assistant

## 2021-05-04 DIAGNOSIS — F5101 Primary insomnia: Secondary | ICD-10-CM

## 2021-05-04 MED ORDER — ZOLPIDEM TARTRATE 10 MG PO TABS: 10.0000 mg | ORAL_TABLET | Freq: Every evening | ORAL | 5 refills | Status: DC | PRN

## 2021-06-03 ENCOUNTER — Other Ambulatory Visit: Payer: Self-pay

## 2021-06-03 ENCOUNTER — Ambulatory Visit
Admission: RE | Admit: 2021-06-03 | Discharge: 2021-06-03 | Disposition: A | Discharge status: Home / Self Care | Payer: BC Managed Care – PPO | Source: Ambulatory Visit | Attending: Physician Assistant | Admitting: Physician Assistant

## 2021-06-03 DIAGNOSIS — R921 Mammographic calcification found on diagnostic imaging of breast: Secondary | ICD-10-CM

## 2021-06-06 ENCOUNTER — Telehealth: Payer: Self-pay | Admitting: Physician Assistant

## 2021-06-06 DIAGNOSIS — D0511 Intraductal carcinoma in situ of right breast: Secondary | ICD-10-CM | POA: Insufficient documentation

## 2021-06-06 HISTORY — DX: Intraductal carcinoma in situ of right breast: D05.11

## 2021-06-06 NOTE — Telephone Encounter (Signed)
Mychart message sent.

## 2021-06-14 ENCOUNTER — Ambulatory Visit (INDEPENDENT_AMBULATORY_CARE_PROVIDER_SITE_OTHER): Payer: BC Managed Care – PPO | Admitting: Psychiatry

## 2021-06-14 ENCOUNTER — Encounter (HOSPITAL_COMMUNITY): Payer: Self-pay | Admitting: Psychiatry

## 2021-06-14 VITALS — BP 156/96 | Ht 67.0 in | Wt 197.0 lb

## 2021-06-14 DIAGNOSIS — F339 Major depressive disorder, recurrent, unspecified: Secondary | ICD-10-CM | POA: Diagnosis not present

## 2021-06-14 DIAGNOSIS — F411 Generalized anxiety disorder: Secondary | ICD-10-CM

## 2021-06-14 DIAGNOSIS — M8589 Other specified disorders of bone density and structure, multiple sites: Secondary | ICD-10-CM

## 2021-06-14 HISTORY — DX: Other specified disorders of bone density and structure, multiple sites: M85.89

## 2021-06-14 MED ORDER — LORAZEPAM 0.5 MG PO TABS: 0.5000 mg | ORAL_TABLET | Freq: Every day | ORAL | 0 refills | Status: DC | PRN

## 2021-06-14 NOTE — Progress Notes (Signed)
[facility] Outpatient Follow up visit  Tele psych visit Patient Identification: Kayla Hood MRN:  [id number] Date of Evaluation:  06/14/2021 Referral Source: pcp Chief Complaint:    depression follow up  Visit Diagnosis:    ICD-10-CM   1. Major depression, recurrent, chronic (HCC)  F33.9     2. GAD (generalized anxiety disorder)  F41.1        History of Present Illness:     Has been doing fair but recently diagnosed with breast cancer early stage so going through the procedure is and treatment options.  She has lowered and stopped Lexapro but takes it every fifth day of quarter otherwise he has some dizziness  Sleep study done mild to moderate sleep apnea will be using CPAP machine  She had an MRI tomorrow wants some Ativan or Xanax to help with anxiety of claustrophobia  Otherwise husband is supportive family supportive  Relationship is fine Plans to lower ambien later by sleep clinic   Aggravating factors; [place] living.  Recent diagnosis of breast cancer  modifying factors; keeps busy..son  Severity of depression; manageable  Duration 4 plus years  Past Psychiatric History: depression, anxiety   Previous Psychotropic Medications: Yes  VIIbryd made her agitated.  zoloft didn't help later Substance Abuse History in the last 12 months:  No.  Consequences of Substance Abuse: NA  Past Medical History:  Past Medical History:  Diagnosis Date   Abnormal chest x-ray 03/03/2013   Patient brings in her report of xray done in 10/2012 and 11/2012 which showed persistent abnormality in the left perihilar and infrahilar regions posteriorly. A follow up chest xray was recommended.  Formatting of this note might be different from the original. Formatting of this note might be different from the original. 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  F/u in 2-3 weeks.  Pt offered psych consult but declines.   Anxiety state 06/07/2015   Bruise of breast 03/29/2020   Cervical radiculopathy 12/19/2017   Chest tightness or pressure 02/24/2018   Chicken pox    Class 1 obesity due to excess calories without serious comorbidity with body mass index (BMI) of 30.0 to 30.9 in adult 11/01/2018   Coccydynia 03/09/2021   no trauma, on exam slightly prominent and recent weight loss probably increased pressure to site0 motrin q 8 x 2 weeks then prn - donut seat and activity modification. F/U prn- she declines rx - no pain   Degenerative disc disease, lumbar 05/16/2018   Formatting of this note might be different from the original. Last Assessment & Plan:  Formatting of this note might be different from the original. Acute mid low back pain. Likely discogenic, started conservatively, prednisone, Robaxin, formal PT. Thoracic and lumbar spine x-rays as the pain is near the thoracolumbar junction. She does have significant pain out of proportion to degree of palpatio   Depression    Depression with anxiety 07/08/2015   PHQ-9 was 3. GAD-7 was 1. 06/2016.   Formatting of this note might be different from the original. Overview:  PHQ-9 was 3. GAD-7 was 1. 06/2016.   Last Assessment & Plan:  Inc lexapro 20 mg qd  con't ativan   Diverticulitis of large  intestine without perforation or abscess 01/23/2017   Dyslipidemia (high LDL; low HDL) 12/22/2015   10 year cardiovascular risk 5.5 percent 02/2018.    Ear itching 03/12/2017   Elevated alkaline phosphatase level 01/24/2021   Elevated LFTs 12/31/2019   Endometriosis 07/08/2015   Erythematous papules of skin 03/30/2020   Essential hypertension, benign 12/22/2015   Exposure to blood or body fluid 11/24/2013   Last Assessment & Plan:  Suspect low risk exposure but post exposure labs pend for  reassurance and repeat these in 3-6 months.   Formatting of this note might be different from the original. Formatting of this note might be different from the original. Last Assessment & Plan:  Suspect low risk exposure but post exposure labs pend for reassurance and repeat these in 3-6 months.   Facial edema 02/04/2014   Family history of early CAD 03/04/2018   Fibroids 07/08/2015   Fullness of breast 03/30/2020   Gallstones 12/31/2019   General medical exam 03/03/2013   Formatting of this note might be different from the original. Last Assessment & Plan:  Formatting of this note might be different from the original. Normal physical exam except for problems listed below. Labs ordered: cbc w/diff, cmet, lipid, TSH, vit D level (hx of deficiency). Also ordered EKG, chest xray to follow up on previously noted abnormality. Cardiology evaluation.   Hand pain 02/07/2021   Herpes simplex 03/09/2021   Discussed Tx. options. She would like to have valtrex, but would like to get it from a pharmacy outside this [facility].  Written Rx for Valtrex 1000mg  2 tabs bid x 1 day given. #4 with 3 RF.  Advised her to take her medication today and then refill the Rx to ha   Hidradenitis suppurativa 12/22/2015   History of left tennis elbow 07/23/2017   Formatting of this note might be different from the original. Last Assessment & Plan:  Failure of conservative measures, ultrasound guided injection as above.   History of potentially hazardous body fluid exposure 11/24/2013   Last Assessment & Plan:  Formatting of this note might be different from the original. Suspect low risk exposure but post exposure labs pend for reassurance and repeat these in 3-6 months. Formatting of this note might be different from the original. Last Assessment & Plan:  Formatting of this note might be different from the original. Suspect low risk exposure but post exposure labs pend for reas   Hives 01/26/2014   Formatting of this note might be different from  the original. Last Assessment & Plan:  Formatting of this note might be different from the original. Uncertain etiology. Start Zyrtec nightly. If no improvement in 2 weeks will refer to Allergist.   Hot flashes due to surgical menopause 12/22/2015   Hypertension    Incontinence of urine    Irregular heart beat 03/03/2013   Formatting of this note might be different from the original. Last Assessment & Plan:  Patient feeling skipped beats which are increasing in frequency. Associated with feeling lightheaded, fatigued. Will check metabolic panel, TSH. Patient also reports that her husband says she snores. I am ordering a sleep study to r/o OSA which may cause irregular heart beat. EKG was done and shows a minimal ST    Lateral epicondylitis, left elbow 07/23/2017   Left elbow pain 04/28/2020   Formatting of this note might be different from the original. Added automatically from request for surgery [id number]   Left hip pain 06/28/2013   Formatting of this note  might be different from the original. Last Assessment & Plan:  Suspect this is tendinitis involving the iliopsoas tendon. Send for left hip x-ray to rule out any structural abnormalities. Start anti-inflammatories such as ibuprofen 600 mg every 6 hours for the next 3-4 days, ice for 15 minutes 3-4 times a day for the next 3-4 days and rest. Once inflammation subsides recomm   Left lateral epicondylitis 04/28/2020   Formatting of this note might be different from the original. Added automatically from request for surgery [id number]   Left-sided chest pain 01/25/2021   Low back pain 03/09/2021   Major depression, recurrent, chronic ([place]) 05/09/2017   Malignant neoplasm of skin 03/09/2021   Placed consult for removal at [facility] clinic   Migraine headache 03/09/2021   Pt with hx of migraine ha needs refill of meds today   Mixed dyslipidemia 02/08/2021   Need for prophylactic vaccination and inoculation against influenza 03/09/2021   Nodule of finger of both hands  08/18/2016   Numbness of left hand 09/03/2017   Formatting of this note might be different from the original. Last Assessment & Plan:  C8 distribution versus ulnar nerve. She does have a history of a multilevel cervical fusion. Symptoms have been present for a long time now, it sounds as her neurosurgeon was planning a repeat MRI. I'm going to order a nerve conduction study, repeat x-rays, depending on what we see I may order an MRI with and wi   Obesity (BMI 30-39.9) 02/08/2021   OCD (obsessive compulsive disorder) 12/14/2017   Open wound 11/24/2013   Formatting of this note might be different from the original. Last Assessment & Plan:  Formatting of this note might be different from the original. Keep clean and observe for sx of infection. Rx for Augmentin to begin prn infection. FU for any wound problems. Last Assessment & Plan:  Formatting of this note might be different from the original. Keep clean and observe for sx of infection. Rx for A   Other dysfunctions of sleep stages or arousal from sleep 03/09/2021   Pt offered Lunest or change to non abuse med.  Pt declines.  Will write for in house meds as noted.  Pt to f/u. PRN   Overweight (BMI 25.0-29.9) 03/27/2018   Palpitations 03/21/2013   Formatting of this note might be different from the original. Last Assessment & Plan:  Pt has seen cardiology in past She was put on BB but it made her too tired She was given norvasc for bp but has not started it yet Probably related to recent increase in stress and anxiety   Pre-diabetes 10/30/2018   Premenopausal menorrhagia 03/09/2021   HOT flashes- was on OCp prior- she dc'd has appt c GYN provider in a few weeks - also recently dc'd her zoloft- should restart zoloft for vasomotor sx will given ambien prn sleep difficulties see below - also pt to keep appt c GYN provider do not suggest altering hormones by me today as she is managed by specialist - discussed calcium also see below- avoid spicy foods and caffeine    Primary insomnia 03/27/2018   Primary osteoarthritis of left hip 05/08/2019   Formatting of this note might be different from the original. Last Assessment & Plan:  Formatting of this note might be different from the original. Olayinka has 2 pain generators, the hip joint on the greater trochanteric bursa. We injected the left hip joint at the last visit, and her groin pain has completely resolved. She  still has some lateral pain referrable to the bursa, hip abductors are weak   PVC (premature ventricular contraction) 01/25/2021   RLQ abdominal pain 01/26/2014   Formatting of this note might be different from the original. Last Assessment & Plan:  RLQ tenderness with palpation and frequent nausea - concerning for appendicitis. Also, her report of symptoms point toward gallbladder. Recommend CT of abdomen/pelvis. Check cbc and cmet. She wanted to wait to have CT done on Wednesday. Instructed to call if symptoms worsen.   Sciatica 03/09/2021   referral to ortho; lumbar x-ray pending   Screening for diabetes mellitus 12/22/2015   Shoulder pain 12/19/2017   Sleep disturbance 03/03/2013   Formatting of this note might be different from the original. Last Assessment & Plan:  Formatting of this note might be different from the original. Re-signed CSC since I could not find the one on file. She has been on Ambien for years and it is helpful 5 mg a night (halves her 10 mg). Will continue this.   Tendonitis of shoulder 09/01/2013   UTI (lower urinary tract infection)    Vaginal dryness, menopausal 12/22/2015    Past Surgical History:  Procedure Laterality Date   ABLATION     CERVICAL FUSION  2012   CHOLECYSTECTOMY     pelvic laproscopic surgeries     4 prior to 2008    Family Psychiatric History: Dad; alcohol use in past. Depression. MOm side has depression and some bipolar in family  Family History:  Family History  Problem Relation Age of Onset   Hyperlipidemia Mother    Ulcerative colitis Mother    Heart  disease Father 29       CAD   Hypertension Father    Diverticulitis Father    Hypertension Brother    Cancer Maternal Grandfather 26       Stomach Cancer   Cancer Paternal Grandmother 56       Uterine Cancer   Stroke Paternal Grandfather 23       CVA   Cancer Maternal Grandmother     Social History:   Social History   Socioeconomic History   Marital status: Married    Spouse name: Rocsi Hazelbaker   Number of children: 1   Years of education: 14   Highest education level: Not on file  Occupational History   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  Tobacco Use   Smoking status: Never   Smokeless tobacco: Never  Vaping Use   Vaping Use: Never used  Substance and Sexual Activity   Alcohol use: No    Alcohol/week: 2.0 standard drinks    Types: 2 Cans of beer per week    Comment: Occasionally    Drug use: No   Sexual activity: Yes    Birth control/protection: None  Other Topics Concern   Not on file  Social History Narrative   Not on file   Social Determinants of Health   Financial Resource Strain: Not on file  Food Insecurity: Not on file  Transportation Needs: Not on file  Physical Activity: Not on file  Stress: Not on file  Social Connections: Not on file     Allergies:   Allergies  Allergen Reactions   Promethazine Anaphylaxis and Other (See Comments)    Other Reaction: OTHER REACTION Other reaction(s): Unknown   Phenergan [Promethazine Hcl] Rash    Metabolic Disorder Labs: Lab Results  Component Value Date  HGBA1C 5.9 (H) 08/13/2019   MPG 123 08/13/2019   MPG 120 10/29/2018   No results found for: PROLACTIN Lab Results  Component Value Date   CHOL 246 (H) 08/13/2019   TRIG 377 (H) 08/13/2019   HDL 43 (L) 08/13/2019   CHOLHDL 5.7 (H) 08/13/2019   VLDL 37 (H) 12/22/2015   LDLCALC 145 (H) 08/13/2019   LDLCALC 173 (H) 10/29/2018     Current Medications: Current Outpatient Medications   Medication Sig Dispense Refill   amLODipine (NORVASC) 10 MG tablet Take 1 tablet (10 mg total) by mouth daily. 90 tablet 0   LORazepam (ATIVAN) 0.5 MG tablet Take 1 tablet (0.5 mg total) by mouth daily as needed for anxiety. 5 tablet 0   metoprolol succinate (TOPROL-XL) 25 MG 24 hr tablet Take 25-50 mg by mouth daily.     zolpidem (AMBIEN) 10 MG tablet Take 1 tablet (10 mg total) by mouth at bedtime as needed for sleep. 30 tablet 5   escitalopram (LEXAPRO) 20 MG tablet Take 20 mg by mouth daily. (Patient not taking: Reported on 06/14/2021)     No current facility-administered medications for this visit.      Psychiatric Specialty Exam: Review of Systems  Cardiovascular:  Negative for chest pain.  Psychiatric/Behavioral:  Negative for depression and suicidal ideas.    Blood pressure (!) 156/96, height 5\' 7"  (1.702 m), weight 197 lb (89.4 kg).Body mass index is 30.85 kg/m.  General Appearance: Casual  Eye Contact: Fair  Speech:  Normal Rate  Volume:  Normal  Mood: g fair  Affect:    Thought Process:  Goal Directed  Orientation:  Full (Time, Place, and Person)  Thought Content:  Rumination  Suicidal Thoughts:  No  Homicidal Thoughts:  No  Memory:  Immediate;   Fair Recent;   Fair  Judgement:  Fair  Insight:  Fair  Psychomotor Activity:  Normal  Concentration:  Concentration: Fair and Attention Span: Fair  Recall:  [employer] of Knowledge:Fair  Language: Fair  Akathisia:  Negative  Handed:  Right  AIMS (if indicated):    Assets:  Desire for Improvement  ADL's:  Intact  Cognition: WNL  Sleep:  Fair with meds    Treatment Plan Summary: Medication management and Plan as follows   1. Major depression, recurrent , moderate: r doing fair although going through cancer diagnosis but following through the procedures and appointments provided supportive therapy continue low-dose Lexapro for now every 4 to 5 days as she has been doing  Waiting for CPAP machine for her sleep  apnea   2. GAD: Anxiety related to current stressors including having an MRI tomorrow will write 5 tablets of Ativan small dose  3. Insomnia: on ambien, she plans to cut down in the next months and talk with sleep clinic how to do so Total face-to-face time spent 15 minutes Fu 36m in office Merian Capron, MD 6/21/20222:16 PM

## 2021-06-15 ENCOUNTER — Encounter: Payer: Self-pay | Admitting: Physician Assistant

## 2021-06-15 DIAGNOSIS — G4733 Obstructive sleep apnea (adult) (pediatric): Secondary | ICD-10-CM

## 2021-06-15 HISTORY — DX: Obstructive sleep apnea (adult) (pediatric): G47.33

## 2021-07-01 DIAGNOSIS — C50919 Malignant neoplasm of unspecified site of unspecified female breast: Secondary | ICD-10-CM | POA: Insufficient documentation

## 2021-07-01 HISTORY — DX: Malignant neoplasm of unspecified site of unspecified female breast: C50.919

## 2021-07-26 ENCOUNTER — Ambulatory Visit (HOSPITAL_COMMUNITY): Payer: BC Managed Care – PPO | Admitting: Psychiatry

## 2021-08-02 ENCOUNTER — Ambulatory Visit (INDEPENDENT_AMBULATORY_CARE_PROVIDER_SITE_OTHER): Payer: BC Managed Care – PPO | Admitting: Psychiatry

## 2021-08-02 ENCOUNTER — Encounter (HOSPITAL_COMMUNITY): Payer: Self-pay | Admitting: Psychiatry

## 2021-08-02 VITALS — BP 162/98 | HR 96 | Ht 67.0 in | Wt 199.0 lb

## 2021-08-02 DIAGNOSIS — F411 Generalized anxiety disorder: Secondary | ICD-10-CM | POA: Diagnosis not present

## 2021-08-02 DIAGNOSIS — F339 Major depressive disorder, recurrent, unspecified: Secondary | ICD-10-CM

## 2021-08-02 MED ORDER — ESCITALOPRAM OXALATE 10 MG PO TABS: 10.0000 mg | ORAL_TABLET | Freq: Every day | ORAL | 0 refills | Status: DC

## 2021-08-02 NOTE — Progress Notes (Signed)
[facility] Outpatient Follow up visit  Tele psych visit Patient Identification: Kayla Hood MRN:  [id number] Date of Evaluation:  08/02/2021 Referral Source: pcp Chief Complaint:    depression follow up  Visit Diagnosis:    ICD-10-CM   1. Major depression, recurrent, chronic (HCC)  F33.9     2. GAD (generalized anxiety disorder)  F41.1         History of Present Illness:    Patient has been off Lexapro but apparently she is going through breast cancer evaluation and has seen a therapist some anxiety and has had some MRIs so we discussed of option to restart back on a small dose of Lexapro is not taking Ambien either for sleep  Relationship is going on fine Aggravating factors; country living. When son in college feels lonely, recent cancer diagnosis or evaluation Modifying factors; keeps busy..  Some  Duration 4 plus years  Past Psychiatric History: depression, anxiety   Previous Psychotropic Medications: Yes  VIIbryd made her agitated.  zoloft didn't help later Substance Abuse History in the last 12 months:  No.  Consequences of Substance Abuse: NA  Past Medical History:  Past Medical History:  Diagnosis Date   Abnormal chest x-ray 03/03/2013   Patient brings in her report of xray done in 10/2012 and 11/2012 which showed persistent abnormality in the left perihilar and infrahilar regions posteriorly. A follow up chest xray was recommended.  Formatting of this note might be different from the original. Formatting of this note might be different from the original. 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  F/u in 2-3 weeks.  Pt offered psych consult but declines.   Anxiety state 06/07/2015   Bruise of breast  03/29/2020   Cervical radiculopathy 12/19/2017   Chest tightness or pressure 02/24/2018   Chicken pox    Class 1 obesity due to excess calories without serious comorbidity with body mass index (BMI) of 30.0 to 30.9 in adult 11/01/2018   Coccydynia 03/09/2021   no trauma, on exam slightly prominent and recent weight loss probably increased pressure to site0 motrin q 8 x 2 weeks then prn - donut seat and activity modification. F/U prn- she declines rx - no pain   Degenerative disc disease, lumbar 05/16/2018   Formatting of this note might be different from the original. Last Assessment & Plan:  Formatting of this note might be different from the original. Acute mid low back pain. Likely discogenic, started conservatively, prednisone, Robaxin, formal PT. Thoracic and lumbar spine x-rays as the pain is near the thoracolumbar junction. She does have significant pain out of proportion to degree of palpatio   Depression    Depression with anxiety 07/08/2015   PHQ-9 was 3. GAD-7 was 1. 06/2016.   Formatting of this note might be different from the original. Overview:  PHQ-9 was 3. GAD-7 was 1. 06/2016.   Last Assessment & Plan:  Inc lexapro 20 mg qd  con't ativan   Diverticulitis of large intestine without perforation or abscess 01/23/2017   Dyslipidemia (high LDL; low HDL) 12/22/2015   10 year cardiovascular risk 5.5 percent 02/2018.    Ear itching 03/12/2017   Elevated alkaline phosphatase level 01/24/2021   Elevated  LFTs 12/31/2019   Endometriosis 07/08/2015   Erythematous papules of skin 03/30/2020   Essential hypertension, benign 12/22/2015   Exposure to blood or body fluid 11/24/2013   Last Assessment & Plan:  Suspect low risk exposure but post exposure labs pend for reassurance and repeat these in 3-6 months.   Formatting of this note might be different from the original. Formatting of this note might be different from the original. Last Assessment & Plan:  Suspect low risk exposure but post exposure labs pend for  reassurance and repeat these in 3-6 months.   Facial edema 02/04/2014   Family history of early CAD 03/04/2018   Fibroids 07/08/2015   Fullness of breast 03/30/2020   Gallstones 12/31/2019   General medical exam 03/03/2013   Formatting of this note might be different from the original. Last Assessment & Plan:  Formatting of this note might be different from the original. Normal physical exam except for problems listed below. Labs ordered: cbc w/diff, cmet, lipid, TSH, vit D level (hx of deficiency). Also ordered EKG, chest xray to follow up on previously noted abnormality. Cardiology evaluation.   Hand pain 02/07/2021   Herpes simplex 03/09/2021   Discussed Tx. options. She would like to have valtrex, but would like to get it from a pharmacy outside this [facility].  Written Rx for Valtrex '1000mg'$  2 tabs bid x 1 day given. #4 with 3 RF.  Advised her to take her medication today and then refill the Rx to ha   Hidradenitis suppurativa 12/22/2015   History of left tennis elbow 07/23/2017   Formatting of this note might be different from the original. Last Assessment & Plan:  Failure of conservative measures, ultrasound guided injection as above.   History of potentially hazardous body fluid exposure 11/24/2013   Last Assessment & Plan:  Formatting of this note might be different from the original. Suspect low risk exposure but post exposure labs pend for reassurance and repeat these in 3-6 months. Formatting of this note might be different from the original. Last Assessment & Plan:  Formatting of this note might be different from the original. Suspect low risk exposure but post exposure labs pend for reas   Hives 01/26/2014   Formatting of this note might be different from the original. Last Assessment & Plan:  Formatting of this note might be different from the original. Uncertain etiology. Start Zyrtec nightly. If no improvement in 2 weeks will refer to Allergist.   Hot flashes due to surgical menopause 12/22/2015    Hypertension    Incontinence of urine    Irregular heart beat 03/03/2013   Formatting of this note might be different from the original. Last Assessment & Plan:  Patient feeling skipped beats which are increasing in frequency. Associated with feeling lightheaded, fatigued. Will check metabolic panel, TSH. Patient also reports that her husband says she snores. I am ordering a sleep study to r/o OSA which may cause irregular heart beat. EKG was done and shows a minimal ST    Lateral epicondylitis, left elbow 07/23/2017   Left elbow pain 04/28/2020   Formatting of this note might be different from the original. Added automatically from request for surgery [id number]   Left hip pain 06/28/2013   Formatting of this note might be different from the original. Last Assessment & Plan:  Suspect this is tendinitis involving the iliopsoas tendon. Send for left hip x-ray to rule out any structural abnormalities. Start anti-inflammatories such as ibuprofen 600 mg every 6  hours for the next 3-4 days, ice for 15 minutes 3-4 times a day for the next 3-4 days and rest. Once inflammation subsides recomm   Left lateral epicondylitis 04/28/2020   Formatting of this note might be different from the original. Added automatically from request for surgery [id number]   Left-sided chest pain 01/25/2021   Low back pain 03/09/2021   Major depression, recurrent, chronic ([place]) 05/09/2017   Malignant neoplasm of skin 03/09/2021   Placed consult for removal at [facility] clinic   Migraine headache 03/09/2021   Pt with hx of migraine ha needs refill of meds today   Mixed dyslipidemia 02/08/2021   Need for prophylactic vaccination and inoculation against influenza 03/09/2021   Nodule of finger of both hands 08/18/2016   Numbness of left hand 09/03/2017   Formatting of this note might be different from the original. Last Assessment & Plan:  C8 distribution versus ulnar nerve. She does have a history of a multilevel cervical fusion. Symptoms have been present  for a long time now, it sounds as her neurosurgeon was planning a repeat MRI. I'm going to order a nerve conduction study, repeat x-rays, depending on what we see I may order an MRI with and wi   Obesity (BMI 30-39.9) 02/08/2021   OCD (obsessive compulsive disorder) 12/14/2017   Open wound 11/24/2013   Formatting of this note might be different from the original. Last Assessment & Plan:  Formatting of this note might be different from the original. Keep clean and observe for sx of infection. Rx for Augmentin to begin prn infection. FU for any wound problems. Last Assessment & Plan:  Formatting of this note might be different from the original. Keep clean and observe for sx of infection. Rx for A   Other dysfunctions of sleep stages or arousal from sleep 03/09/2021   Pt offered Lunest or change to non abuse med.  Pt declines.  Will write for in house meds as noted.  Pt to f/u. PRN   Overweight (BMI 25.0-29.9) 03/27/2018   Palpitations 03/21/2013   Formatting of this note might be different from the original. Last Assessment & Plan:  Pt has seen cardiology in past She was put on BB but it made her too tired She was given norvasc for bp but has not started it yet Probably related to recent increase in stress and anxiety   Pre-diabetes 10/30/2018   Premenopausal menorrhagia 03/09/2021   HOT flashes- was on OCp prior- she dc'd has appt c GYN provider in a few weeks - also recently dc'd her zoloft- should restart zoloft for vasomotor sx will given ambien prn sleep difficulties see below - also pt to keep appt c GYN provider do not suggest altering hormones by me today as she is managed by specialist - discussed calcium also see below- avoid spicy foods and caffeine   Primary insomnia 03/27/2018   Primary osteoarthritis of left hip 05/08/2019   Formatting of this note might be different from the original. Last Assessment & Plan:  Formatting of this note might be different from the original. [place]lin has 2 pain generators,  the hip joint on the greater trochanteric bursa. We injected the left hip joint at the last visit, and her groin pain has completely resolved. She still has some lateral pain referrable to the bursa, hip abductors are weak   PVC (premature ventricular contraction) 01/25/2021   RLQ abdominal pain 01/26/2014   Formatting of this note might be different from the original. Last  Assessment & Plan:  RLQ tenderness with palpation and frequent nausea - concerning for appendicitis. Also, her report of symptoms point toward gallbladder. Recommend CT of abdomen/pelvis. Check cbc and cmet. She wanted to wait to have CT done on Wednesday. Instructed to call if symptoms worsen.   Sciatica 03/09/2021   referral to ortho; lumbar x-ray pending   Screening for diabetes mellitus 12/22/2015   Shoulder pain 12/19/2017   Sleep disturbance 03/03/2013   Formatting of this note might be different from the original. Last Assessment & Plan:  Formatting of this note might be different from the original. Re-signed CSC since I could not find the one on file. She has been on Ambien for years and it is helpful 5 mg a night (halves her 10 mg). Will continue this.   Tendonitis of shoulder 09/01/2013   UTI (lower urinary tract infection)    Vaginal dryness, menopausal 12/22/2015    Past Surgical History:  Procedure Laterality Date   ABLATION     CERVICAL FUSION  2012   CHOLECYSTECTOMY     pelvic laproscopic surgeries     4 prior to 2008    Family Psychiatric History: Dad; alcohol use in past. Depression. MOm side has depression and some bipolar in family  Family History:  Family History  Problem Relation Age of Onset   Hyperlipidemia Mother    Ulcerative colitis Mother    Heart disease Father 43       CAD   Hypertension Father    Diverticulitis Father    Hypertension Brother    Cancer Maternal Grandfather 71       Stomach Cancer   Cancer Paternal Grandmother 49       Uterine Cancer   Stroke Paternal Grandfather 18        CVA   Cancer Maternal Grandmother     Social History:   Social History   Socioeconomic History   Marital status: Married    Spouse name: Laycee Numbers   Number of children: 1   Years of education: 14   Highest education level: Not on file  Occupational History   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  Tobacco Use   Smoking status: Never   Smokeless tobacco: Never  Vaping Use   Vaping Use: Never used  Substance and Sexual Activity   Alcohol use: No    Alcohol/week: 2.0 standard drinks    Types: 2 Cans of beer per week    Comment: Occasionally    Drug use: No   Sexual activity: Yes    Birth control/protection: None  Other Topics Concern   Not on file  Social History Narrative   Not on file   Social Determinants of Health   Financial Resource Strain: Not on file  Food Insecurity: Not on file  Transportation Needs: Not on file  Physical Activity: Not on file  Stress: Not on file  Social Connections: Not on file     Allergies:   Allergies  Allergen Reactions   Promethazine Anaphylaxis and Other (See Comments)    Other Reaction: OTHER REACTION Other reaction(s): Unknown   Phenergan [Promethazine Hcl] Rash    Metabolic Disorder Labs: Lab Results  Component Value Date   HGBA1C 5.9 (H) 08/13/2019   MPG 123 08/13/2019   MPG 120 10/29/2018   No results found for: PROLACTIN Lab Results  Component Value Date   CHOL 246 (H) 08/13/2019   TRIG 377 (H) 08/13/2019  HDL 43 (L) 08/13/2019   CHOLHDL 5.7 (H) 08/13/2019   VLDL 37 (H) 12/22/2015   LDLCALC 145 (H) 08/13/2019   LDLCALC 173 (H) 10/29/2018     Current Medications: Current Outpatient Medications  Medication Sig Dispense Refill   amLODipine (NORVASC) 10 MG tablet Take 1 tablet (10 mg total) by mouth daily. 90 tablet 0   metoprolol succinate (TOPROL-XL) 25 MG 24 hr tablet Take 25-50 mg by mouth daily.     escitalopram (LEXAPRO) 10 MG tablet Take 1  tablet (10 mg total) by mouth daily. 30 tablet 0   zolpidem (AMBIEN) 10 MG tablet Take 1 tablet (10 mg total) by mouth at bedtime as needed for sleep. (Patient not taking: Reported on 08/02/2021) 30 tablet 5   No current facility-administered medications for this visit.      Psychiatric Specialty Exam: Review of Systems  Cardiovascular:  Negative for chest pain.  Psychiatric/Behavioral:  Negative for depression and suicidal ideas.    Blood pressure (!) 162/98, pulse 96, height '5\' 7"'$  (1.702 m), weight 199 lb (90.3 kg), SpO2 96 %.Body mass index is 31.17 kg/m.  General Appearance: Casual  Eye Contact: Fair  Speech:  Normal Rate  Volume:  Normal  Mood: g fair  Affect:    Thought Process:  Goal Directed  Orientation:  Full (Time, Place, and Person)  Thought Content:  Rumination  Suicidal Thoughts:  No  Homicidal Thoughts:  No  Memory:  Immediate;   Fair Recent;   Fair  Judgement:  Fair  Insight:  Fair  Psychomotor Activity:  Normal  Concentration:  Concentration: Fair and Attention Span: Fair  Recall:  [employer] of Knowledge:Fair  Language: Fair  Akathisia:  Negative  Handed:  Right  AIMS (if indicated):    Assets:  Desire for Improvement  ADL's:  Intact  Cognition: WNL  Sleep:  Fair with meds    Treatment Plan Summary: Medication management and Plan as follows    Prior documentation reviewed 1. Major depression, recurrent , moderate: Doing reasonable but considering her cancer evaluation and some anxiety associated with it it would be better to restart back on Lexapro at least 10 mg continue therapy with the oncology psychologist  2. GAD: Recurrence of anxiety restart Lexapro at a dose of 10 mg  3. Insomnia: Not taking Ambien reviewed sleep hygiene  Time spent face-to-face 15 minutes  Follow-up in 5 weeks in office Merian Capron, MD 8/9/20224:34 PM

## 2021-08-08 ENCOUNTER — Encounter: Payer: Self-pay | Admitting: Physician Assistant

## 2021-08-08 DIAGNOSIS — I10 Essential (primary) hypertension: Secondary | ICD-10-CM

## 2021-08-08 MED ORDER — AMLODIPINE BESYLATE 10 MG PO TABS: 10.0000 mg | ORAL_TABLET | Freq: Every day | ORAL | 0 refills | Status: DC

## 2021-08-08 NOTE — Telephone Encounter (Signed)
Please call pt to schedule appt.  No further refills until pt is seen.  T. Alanee Ting, CMA  

## 2021-08-08 NOTE — Telephone Encounter (Signed)
Called pt and left a VM for pt to call back and schedule a F/u appt with [facility] for her BP

## 2021-08-15 ENCOUNTER — Telehealth: Payer: Self-pay | Admitting: *Deleted

## 2021-08-15 NOTE — Telephone Encounter (Signed)
Transition Care Management Unsuccessful Follow-up Telephone Call  Date of discharge and from where:  08/12/2021 - [place]  Attempts:  1st Attempt  Reason for unsuccessful TCM follow-up call:  Left voice message

## 2021-08-16 NOTE — Telephone Encounter (Signed)
Transition Care Management Follow-up Telephone Call Date of discharge and from where: 08/12/2021 - [place] How have you been since you were released from the hospital? "A little sore" Any questions or concerns? No  Items Reviewed: Did the pt receive and understand the discharge instructions provided? Yes  Medications obtained and verified? Yes  Other? No  Any new allergies since your discharge? No  Dietary orders reviewed? No Do you have support at home? Yes    Functional Questionnaire: (I = Independent and D = Dependent) ADLs: I  Bathing/Dressing- I  Meal Prep- I  Eating- I  Maintaining continence- I  Transferring/Ambulation- I  Managing Meds- I  Follow up appointments reviewed:  PCP Hospital f/u appt confirmed? Yes  Scheduled to see Kayla Planas, PA on 08/17/2021 @ 1010. [place] [facility] f/u appt confirmed? No   Are transportation arrangements needed? No  If their condition worsens, is the pt aware to call PCP or go to the Emergency Dept.? Yes Was the patient provided with contact information for the PCP's office or ED? Yes Was to pt encouraged to call back with questions or concerns? Yes

## 2021-08-17 ENCOUNTER — Ambulatory Visit: Payer: BC Managed Care – PPO | Admitting: Physician Assistant

## 2021-08-22 ENCOUNTER — Other Ambulatory Visit: Payer: Self-pay

## 2021-08-22 ENCOUNTER — Encounter: Payer: Self-pay | Admitting: Physician Assistant

## 2021-08-22 ENCOUNTER — Ambulatory Visit (INDEPENDENT_AMBULATORY_CARE_PROVIDER_SITE_OTHER): Payer: BC Managed Care – PPO | Admitting: Physician Assistant

## 2021-08-22 VITALS — BP 142/87 | HR 96 | Ht 67.0 in | Wt 196.0 lb

## 2021-08-22 DIAGNOSIS — D0511 Intraductal carcinoma in situ of right breast: Secondary | ICD-10-CM | POA: Diagnosis not present

## 2021-08-22 DIAGNOSIS — I10 Essential (primary) hypertension: Secondary | ICD-10-CM | POA: Diagnosis not present

## 2021-08-22 DIAGNOSIS — Z9011 Acquired absence of right breast and nipple: Secondary | ICD-10-CM

## 2021-08-22 DIAGNOSIS — F4323 Adjustment disorder with mixed anxiety and depressed mood: Secondary | ICD-10-CM

## 2021-08-22 DIAGNOSIS — R5383 Other fatigue: Secondary | ICD-10-CM

## 2021-08-22 HISTORY — DX: Acquired absence of right breast and nipple: Z90.11

## 2021-08-22 MED ORDER — AMLODIPINE BESYLATE 10 MG PO TABS
10.0000 mg | ORAL_TABLET | Freq: Every day | ORAL | 3 refills | Status: DC
Start: 2021-08-22 — End: 2022-04-20

## 2021-08-22 MED ORDER — TRAMADOL HCL 50 MG PO TABS: 50.0000 mg | ORAL_TABLET | Freq: Four times a day (QID) | ORAL | 0 refills | Status: AC | PRN

## 2021-08-22 NOTE — Progress Notes (Signed)
Subjective:    Patient ID: Kayla Hood, female    DOB: 02-11-1966, 55 y.o.   MRN: [id number]  HPI Patient is a 55 year old female who presents to the clinic 12 days after right mastectomy for ductal breast cancer.  She is doing fairly well.  She does have a expander in place that is quite painful.  She is very fatigued and does not feel great.  She is checking her blood pressure at home and at her doctors visits.  They have not been as high as today.  She does feel like she is in some pain today.  She is not taking any of the oxycodone and does not want to take Norco.  She is alternating Tylenol and ibuprofen with some relief.  She has been back on Lexapro and Ambien for her mood and sleep.  She definitely feels like her mood is more depressed.  She denies any chest pain, palpitations, headaches or vision changes.  She does need a refill on Norvasc.  .. 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   Hidradenitis suppurativa 12/22/2015   Hot flashes due to surgical menopause 12/22/2015   Dyslipidemia (high LDL; low HDL) 12/22/2015   Essential hypertension, benign 12/22/2015   Vaginal dryness, menopausal 12/22/2015   Anxiety 12/22/2015   Screening for diabetes mellitus 12/22/2015   Nodule of finger of both hands 08/18/2016   Abnormal weight gain 11/18/2016   Diverticulitis of large intestine without perforation or abscess 01/23/2017   Ear itching 03/12/2017   Major depression, recurrent,  chronic (HCC) 05/09/2017   Lateral epicondylitis, left elbow 07/23/2017   Numbness of left hand 09/03/2017   Family history of early CAD 03/04/2018   Primary insomnia 03/27/2018   Overweight (BMI 25.0-29.9) 03/27/2018   Degenerative disc disease, lumbar 05/16/2018   Pre-diabetes 10/30/2018   Class 1 obesity due to excess calories without serious comorbidity with body mass index (BMI) of 30.0 to 30.9 in adult 11/01/2018   Primary osteoarthritis of left hip 05/08/2019   Gallstones 12/31/2019   Elevated LFTs 12/31/2019   Bruise of breast 03/29/2020   Erythematous papules of skin 03/30/2020   Fullness of breast 03/30/2020   Cervical radiculopathy 12/19/2017   History of left tennis elbow 07/23/2017   Elevated alkaline phosphatase level 01/24/2021   Left-sided chest pain 01/25/2021   PVC (premature ventricular contraction) 01/25/2021   Allergy    Chicken pox    Depression 12/14/2017   Hypertension    Incontinence of urine    History of potentially hazardous body fluid exposure 11/24/2013   Open wound 11/24/2013   OCD (obsessive compulsive disorder) 12/14/2017   Chest tightness or pressure 02/24/2018   Left lateral epicondylitis 04/28/2020   Hand pain 02/07/2021   Left elbow pain 04/28/2020   Shoulder pain 12/19/2017   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   Mixed dyslipidemia 02/08/2021   Obesity (BMI 30-39.9) 02/08/2021   Herpes simplex 03/09/2021   Coccydynia 03/09/2021   Low back pain  03/09/2021   Malignant neoplasm of skin 03/09/2021   Migraine headache 03/09/2021   Need for prophylactic vaccination and inoculation against influenza 03/09/2021   Other dysfunctions of sleep stages or arousal from sleep 03/09/2021   Premenopausal menorrhagia 03/09/2021   Sciatica 03/09/2021   Anxiety disorder due to medical condition 03/09/2021   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  Constitutional:      Appearance: Normal appearance.  HENT:     Head: Normocephalic.  Cardiovascular:     Rate and Rhythm: Normal rate.     Pulses: Normal pulses.     Heart sounds: Normal heart sounds.  Pulmonary:     Comments: Healing incision over the right chest with presence of expander. Swollen and tender to touch.  Musculoskeletal:     Right lower leg: No edema.     Left lower leg: No edema.  Neurological:     General: No focal deficit present.     Mental Status: She is alert and oriented to person, place, and time.  Psychiatric:        Mood and Affect: Mood normal.          Assessment & Plan:  Marland KitchenMarland KitchenAngenette was seen today for hypertension.  Diagnoses and all orders for this visit:  Essential hypertension, benign -     amLODipine (NORVASC) 10 MG tablet; Take 1 tablet (10 mg total) by mouth daily.  Ductal carcinoma in situ (DCIS) of right breast  S/P mastectomy, right -     traMADol (ULTRAM) 50 MG tablet; Take 1 tablet (50 mg total) by mouth every 6 (six) hours as needed for up to 5 days.  Fatigue, unspecified type  Adjustment reaction with anxiety and depression  BP not to goal today. She has been checking and been a lot better at other appts and at home. She is in some pain today with expander just being adjusted yesterday.  BP did come down on 2nd recheck.  Likely increased due to pain.  Continue to monitor at home.  Refilled norvasc.   Tylenol and ibuprofen not controlling pain. She does not want oxycodone or norco. She didn't even take the ones she was given per patient.  Sent tramadol.  Marland KitchenMarland KitchenPDMP reviewed during this encounter. No concerns.    Pt concerned about fatigue and mood. Discussed it will take some time and likely pain and soreness is taking her energy. Hopefully tramadol will help some. Continue on lexapro. Just had labs and don't thinking checking them right now would be beneficial. If fatigue worsening we will order labs.   Follow up in 3-6 months.

## 2021-08-23 DIAGNOSIS — F4323 Adjustment disorder with mixed anxiety and depressed mood: Secondary | ICD-10-CM

## 2021-08-23 DIAGNOSIS — R5383 Other fatigue: Secondary | ICD-10-CM

## 2021-08-23 HISTORY — DX: Other fatigue: R53.83

## 2021-08-23 HISTORY — DX: Adjustment disorder with mixed anxiety and depressed mood: F43.23

## 2021-08-24 ENCOUNTER — Encounter: Payer: Self-pay | Admitting: Physician Assistant

## 2021-08-24 ENCOUNTER — Telehealth: Payer: Self-pay | Admitting: General Practice

## 2021-08-24 NOTE — Telephone Encounter (Signed)
Transition Care Management Unsuccessful Follow-up Telephone Call  Date of discharge and from where:  08/24/21 from [facility]  Attempts:  1st Attempt  Reason for unsuccessful TCM follow-up call:  Left voice message

## 2021-08-26 NOTE — Telephone Encounter (Signed)
Transition Care Management Follow-up Telephone Call Date of discharge and from where: 08/24/21 from [place] How have you been since you were released from the hospital? Doing much better. Any questions or concerns? No  Items Reviewed: Did the pt receive and understand the discharge instructions provided? Yes  Medications obtained and verified? Yes  Other? No  Any new allergies since your discharge? No  Dietary orders reviewed? Yes Do you have support at home? Yes   Home Care and Equipment/Supplies: Were home health services ordered? no   Functional Questionnaire: (I = Independent and D = Dependent) ADLs: I  Bathing/Dressing- I  Meal Prep- I  Eating- I  Maintaining continence- I  Transferring/Ambulation- I  Managing Meds- I  Follow up appointments reviewed:  PCP Hospital f/u appt confirmed? No  Patient stated that she did not need to schedule a follow up at this time. [facility] f/u appt confirmed? No   Are transportation arrangements needed? No  If their condition worsens, is the pt aware to call PCP or go to the Emergency Dept.? Yes Was the patient provided with contact information for the PCP's office or ED? Yes Was to pt encouraged to call back with questions or concerns? Yes

## 2021-09-05 LAB — HM PAP SMEAR: HM Pap smear: NORMAL

## 2021-09-07 ENCOUNTER — Telehealth (HOSPITAL_COMMUNITY): Payer: BC Managed Care – PPO | Admitting: Psychiatry

## 2021-09-08 ENCOUNTER — Telehealth (INDEPENDENT_AMBULATORY_CARE_PROVIDER_SITE_OTHER): Payer: BC Managed Care – PPO | Admitting: Psychiatry

## 2021-09-08 ENCOUNTER — Encounter (HOSPITAL_COMMUNITY): Payer: Self-pay | Admitting: Psychiatry

## 2021-09-08 DIAGNOSIS — F411 Generalized anxiety disorder: Secondary | ICD-10-CM | POA: Diagnosis not present

## 2021-09-08 DIAGNOSIS — F339 Major depressive disorder, recurrent, unspecified: Secondary | ICD-10-CM | POA: Diagnosis not present

## 2021-09-08 MED ORDER — ESCITALOPRAM OXALATE 10 MG PO TABS: 10.0000 mg | ORAL_TABLET | Freq: Every day | ORAL | 2 refills | Status: DC

## 2021-09-08 NOTE — Progress Notes (Signed)
[facility] Outpatient Follow up visit  Tele psych visit Patient Identification: Kayla Hood MRN:  [id number] Date of Evaluation:  09/08/2021 Referral Source: pcp Chief Complaint:    depression follow up  Visit Diagnosis:    ICD-10-CM   1. Major depression, recurrent, chronic (HCC)  F33.9     2. GAD (generalized anxiety disorder)  F41.1       Virtual Visit via Telephone Note  I connected with Kayla Hood on 09/08/21 at  4:30 PM EDT by telephone and verified that I am speaking with the correct person using two identifiers.  Location: Patient: home Provider: office   I discussed the limitations, risks, security and privacy concerns of performing an evaluation and management service by telephone and the availability of in person appointments. I also discussed with the patient that there may be a patient responsible charge related to this service. The patient expressed understanding and agreed to proceed.       I discussed the assessment and treatment plan with the patient. The patient was provided an opportunity to ask questions and all were answered. The patient agreed with the plan and demonstrated an understanding of the instructions.   The patient was advised to call back or seek an in-person evaluation if the symptoms worsen or if the condition fails to improve as anticipated.  I provided 11 minutes of non-face-to-face time during this encounter.   History of Present Illness:    We have restarted Lexapro last visit as she has been diagnosed with breast cancer and was feeling down with anxiety she has gone through the surgery recovering and moving forward she believes Lexapro has helped and she wants to continue the same dose is 10 mg she does have ample support including her husband and her family members there is no reported side effects  They will discuss if she needs tamoxifen other than that surgery has gone on well Sleep energy level is fair  Relationship is going on  fine Aggravating factors; country living.  Recent surgery and diagnosis of breast cancer Modifying factors; husband, family Duration 4 plus years  Past Psychiatric History: depression, anxiety   Previous Psychotropic Medications: Yes  VIIbryd made her agitated.  zoloft didn't help later Substance Abuse History in the last 12 months:  No.  Consequences of Substance Abuse: NA  Past Medical History:  Past Medical History:  Diagnosis Date   Abnormal chest x-ray 03/03/2013   Patient brings in her report of xray done in 10/2012 and 11/2012 which showed persistent abnormality in the left perihilar and infrahilar regions posteriorly. A follow up chest xray was recommended.  Formatting of this note might be different from the original. Formatting of this note might be different from the original. 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  F/u in 2-3 weeks.  Pt offered psych consult but declines.   Anxiety state 06/07/2015   Bruise of breast 03/29/2020   Cervical radiculopathy 12/19/2017   Chest tightness or pressure 02/24/2018   Chicken pox    Class 1 obesity due to excess calories without serious comorbidity with body mass index (BMI) of 30.0 to 30.9 in adult 11/01/2018   Coccydynia 03/09/2021   no trauma, on  exam slightly prominent and recent weight loss probably increased pressure to site0 motrin q 8 x 2 weeks then prn - donut seat and activity modification. F/U prn- she declines rx - no pain   Degenerative disc disease, lumbar 05/16/2018   Formatting of this note might be different from the original. Last Assessment & Plan:  Formatting of this note might be different from the original. Acute mid low back pain. Likely discogenic,  started conservatively, prednisone, Robaxin, formal PT. Thoracic and lumbar spine x-rays as the pain is near the thoracolumbar junction. She does have significant pain out of proportion to degree of palpatio   Depression    Depression with anxiety 07/08/2015   PHQ-9 was 3. GAD-7 was 1. 06/2016.   Formatting of this note might be different from the original. Overview:  PHQ-9 was 3. GAD-7 was 1. 06/2016.   Last Assessment & Plan:  Inc lexapro 20 mg qd  con't ativan   Diverticulitis of large intestine without perforation or abscess 01/23/2017   Dyslipidemia (high LDL; low HDL) 12/22/2015   10 year cardiovascular risk 5.5 percent 02/2018.    Ear itching 03/12/2017   Elevated alkaline phosphatase level 01/24/2021   Elevated LFTs 12/31/2019   Endometriosis 07/08/2015   Erythematous papules of skin 03/30/2020   Essential hypertension, benign 12/22/2015   Exposure to blood or body fluid 11/24/2013   Last Assessment & Plan:  Suspect low risk exposure but post exposure labs pend for reassurance and repeat these in 3-6 months.   Formatting of this note might be different from the original. Formatting of this note might be different from the original. Last Assessment & Plan:  Suspect low risk exposure but post exposure labs pend for reassurance and repeat these in 3-6 months.   Facial edema 02/04/2014   Family history of early CAD 03/04/2018   Fibroids 07/08/2015   Fullness of breast 03/30/2020   Gallstones 12/31/2019   General medical exam 03/03/2013   Formatting of this note might be different from the original. Last Assessment & Plan:  Formatting of this note might be different from the original. Normal physical exam except for problems listed below. Labs ordered: cbc w/diff, cmet, lipid, TSH, vit D level (hx of deficiency). Also ordered EKG, chest xray to follow up on previously noted abnormality. Cardiology evaluation.   Hand pain 02/07/2021   Herpes simplex 03/09/2021   Discussed Tx. options. She would like to have  valtrex, but would like to get it from a pharmacy outside this [facility].  Written Rx for Valtrex '1000mg'$  2 tabs bid x 1 day given. #4 with 3 RF.  Advised her to take her medication today and then refill the Rx to ha   Hidradenitis suppurativa 12/22/2015   History of left tennis elbow 07/23/2017   Formatting of this note might be different from the original. Last Assessment & Plan:  Failure of conservative measures, ultrasound guided injection as above.   History of potentially hazardous body fluid exposure 11/24/2013   Last Assessment & Plan:  Formatting of this note might be different from the original. Suspect low risk exposure but post exposure labs pend for reassurance and repeat these in 3-6 months. Formatting of this note might be different from the original. Last Assessment & Plan:  Formatting of this note might be different from the original. Suspect low risk exposure but post exposure labs pend for reas   Hives 01/26/2014   Formatting of this note might be different from the original. Last Assessment &  Plan:  Formatting of this note might be different from the original. Uncertain etiology. Start Zyrtec nightly. If no improvement in 2 weeks will refer to Allergist.   Hot flashes due to surgical menopause 12/22/2015   Hypertension    Incontinence of urine    Irregular heart beat 03/03/2013   Formatting of this note might be different from the original. Last Assessment & Plan:  Patient feeling skipped beats which are increasing in frequency. Associated with feeling lightheaded, fatigued. Will check metabolic panel, TSH. Patient also reports that her husband says she snores. I am ordering a sleep study to r/o OSA which may cause irregular heart beat. EKG was done and shows a minimal ST    Lateral epicondylitis, left elbow 07/23/2017   Left elbow pain 04/28/2020   Formatting of this note might be different from the original. Added automatically from request for surgery [id number]   Left hip pain 06/28/2013    Formatting of this note might be different from the original. Last Assessment & Plan:  Suspect this is tendinitis involving the iliopsoas tendon. Send for left hip x-ray to rule out any structural abnormalities. Start anti-inflammatories such as ibuprofen 600 mg every 6 hours for the next 3-4 days, ice for 15 minutes 3-4 times a day for the next 3-4 days and rest. Once inflammation subsides recomm   Left lateral epicondylitis 04/28/2020   Formatting of this note might be different from the original. Added automatically from request for surgery [id number]   Left-sided chest pain 01/25/2021   Low back pain 03/09/2021   Major depression, recurrent, chronic ([place]) 05/09/2017   Malignant neoplasm of skin 03/09/2021   Placed consult for removal at [facility]   Migraine headache 03/09/2021   Pt with hx of migraine ha needs refill of meds today   Mixed dyslipidemia 02/08/2021   Need for prophylactic vaccination and inoculation against influenza 03/09/2021   Nodule of finger of both hands 08/18/2016   Numbness of left hand 09/03/2017   Formatting of this note might be different from the original. Last Assessment & Plan:  C8 distribution versus ulnar nerve. She does have a history of a multilevel cervical fusion. Symptoms have been present for a long time now, it sounds as her neurosurgeon was planning a repeat MRI. I'm going to order a nerve conduction study, repeat x-rays, depending on what we see I may order an MRI with and wi   Obesity (BMI 30-39.9) 02/08/2021   OCD (obsessive compulsive disorder) 12/14/2017   Open wound 11/24/2013   Formatting of this note might be different from the original. Last Assessment & Plan:  Formatting of this note might be different from the original. Keep clean and observe for sx of infection. Rx for Augmentin to begin prn infection. FU for any wound problems. Last Assessment & Plan:  Formatting of this note might be different from the original. Keep clean and observe for sx of infection. Rx  for A   Other dysfunctions of sleep stages or arousal from sleep 03/09/2021   Pt offered Lunest or change to non abuse med.  Pt declines.  Will write for in house meds as noted.  Pt to f/u. PRN   Overweight (BMI 25.0-29.9) 03/27/2018   Palpitations 03/21/2013   Formatting of this note might be different from the original. Last Assessment & Plan:  Pt has seen cardiology in past She was put on BB but it made her too tired She was given norvasc for bp but has not  started it yet Probably related to recent increase in stress and anxiety   Pre-diabetes 10/30/2018   Premenopausal menorrhagia 03/09/2021   HOT flashes- was on OCp prior- she dc'd has appt c GYN provider in a few weeks - also recently dc'd her zoloft- should restart zoloft for vasomotor sx will given ambien prn sleep difficulties see below - also pt to keep appt c GYN provider do not suggest altering hormones by me today as she is managed by specialist - discussed calcium also see below- avoid spicy foods and caffeine   Primary insomnia 03/27/2018   Primary osteoarthritis of left hip 05/08/2019   Formatting of this note might be different from the original. Last Assessment & Plan:  Formatting of this note might be different from the original. T[place]ha has 2 pain generators, the hip joint on the greater trochanteric bursa. We injected the left hip joint at the last visit, and her groin pain has completely resolved. She still has some lateral pain referrable to the bursa, hip abductors are weak   PVC (premature ventricular contraction) 01/25/2021   RLQ abdominal pain 01/26/2014   Formatting of this note might be different from the original. Last Assessment & Plan:  RLQ tenderness with palpation and frequent nausea - concerning for appendicitis. Also, her report of symptoms point toward gallbladder. Recommend CT of abdomen/pelvis. Check cbc and cmet. She wanted to wait to have CT done on Wednesday. Instructed to call if symptoms worsen.   Sciatica 03/09/2021    referral to ortho; lumbar x-ray pending   Screening for diabetes mellitus 12/22/2015   Shoulder pain 12/19/2017   Sleep disturbance 03/03/2013   Formatting of this note might be different from the original. Last Assessment & Plan:  Formatting of this note might be different from the original. Re-signed CSC since I could not find the one on file. She has been on Ambien for years and it is helpful 5 mg a night (halves her 10 mg). Will continue this.   Tendonitis of shoulder 09/01/2013   UTI (lower urinary tract infection)    Vaginal dryness, menopausal 12/22/2015    Past Surgical History:  Procedure Laterality Date   ABLATION     CERVICAL FUSION  2012   CHOLECYSTECTOMY     pelvic laproscopic surgeries     4 prior to 2008    Family Psychiatric History: Dad; alcohol use in past. Depression. MOm side has depression and some bipolar in family  Family History:  Family History  Problem Relation Age of Onset   Hyperlipidemia Mother    Ulcerative colitis Mother    Heart disease Father 49       CAD   Hypertension Father    Diverticulitis Father    Hypertension Brother    Cancer Maternal Grandfather 58       Stomach Cancer   Cancer Paternal Grandmother 73       Uterine Cancer   Stroke Paternal Grandfather 15       CVA   Cancer Maternal Grandmother     Social History:   Social History   Socioeconomic History   Marital status: Married    Spouse name: Gaynell Strow   Number of children: 1   Years of education: 14   Highest education level: Not on file  Occupational History   Occupation: Print production planner for [employer]    Employer: [employer]  Tobacco Use   Smoking status: Never   Smokeless tobacco: Never  Vaping Use  Vaping Use: Never used  Substance and Sexual Activity   Alcohol use: No    Alcohol/week: 2.0 standard drinks    Types: 2 Cans of beer per week    Comment: Occasionally    Drug use: No   Sexual activity: Yes    Birth control/protection:  None  Other Topics Concern   Not on file  Social History Narrative   Not on file   Social Determinants of Health   Financial Resource Strain: Not on file  Food Insecurity: Not on file  Transportation Needs: Not on file  Physical Activity: Not on file  Stress: Not on file  Social Connections: Not on file     Allergies:   Allergies  Allergen Reactions   Promethazine Anaphylaxis and Other (See Comments)    Other Reaction: OTHER REACTION Other reaction(s): Unknown   Phenergan [Promethazine Hcl] Rash    Metabolic Disorder Labs: Lab Results  Component Value Date   HGBA1C 5.9 (H) 08/13/2019   MPG 123 08/13/2019   MPG 120 10/29/2018   No results found for: PROLACTIN Lab Results  Component Value Date   CHOL 246 (H) 08/13/2019   TRIG 377 (H) 08/13/2019   HDL 43 (L) 08/13/2019   CHOLHDL 5.7 (H) 08/13/2019   VLDL 37 (H) 12/22/2015   LDLCALC 145 (H) 08/13/2019   LDLCALC 173 (H) 10/29/2018     Current Medications: Current Outpatient Medications  Medication Sig Dispense Refill   amLODipine (NORVASC) 10 MG tablet Take 1 tablet (10 mg total) by mouth daily. 90 tablet 3   escitalopram (LEXAPRO) 10 MG tablet Take 1 tablet (10 mg total) by mouth daily. 30 tablet 2   metoprolol succinate (TOPROL-XL) 25 MG 24 hr tablet Take 25-50 mg by mouth daily.     zolpidem (AMBIEN) 10 MG tablet Take 1 tablet (10 mg total) by mouth at bedtime as needed for sleep. 30 tablet 5   No current facility-administered medications for this visit.      Psychiatric Specialty Exam: Review of Systems  Cardiovascular:  Negative for chest pain.  Psychiatric/Behavioral:  Negative for depression and suicidal ideas.    There were no vitals taken for this visit.There is no height or weight on file to calculate BMI.  General Appearance:   Eye Contact:   Speech:  Normal Rate  Volume:  Normal  Mood: Fair  Affect:    Thought Process:  Goal Directed  Orientation:  Full (Time, Place, and Person)  Thought  Content:  Rumination  Suicidal Thoughts:  No  Homicidal Thoughts:  No  Memory:  Immediate;   Fair Recent;   Fair  Judgement:  Fair  Insight:  Fair  Psychomotor Activity:  Normal  Concentration:  Concentration: Fair and Attention Span: Fair  Recall:  [employer] of Knowledge:Fair  Language: Fair  Akathisia:  Negative  Handed:  Right  AIMS (if indicated):    Assets:  Desire for Improvement  ADL's:  Intact  Cognition: WNL  Sleep:  Fair with meds    Treatment Plan Summary: Medication management and Plan as follows   Prior documentation reviewed 1. Major depression, recurrent , moderate: Doing reasonable she continues to follow-up with her oncology psychologist continue Lexapro at a dose of 10 mg that is helping to cope with her current stressors including cancer surgery  2. GAD: Manageable continue Lexapro  3. Insomnia: Not worse    Follow-up in 2 months or earlier if needed renewed Lexapro Merian Capron, MD 9/15/20224:38 PM

## 2021-09-16 ENCOUNTER — Ambulatory Visit: Payer: BC Managed Care – PPO | Admitting: Cardiology

## 2021-09-16 ENCOUNTER — Telehealth: Payer: Self-pay

## 2021-09-16 NOTE — Telephone Encounter (Signed)
Medication: traMADol (ULTRAM) 50 MG tablet Prior authorization submitted via CoverMyMeds on 09/16/2021 [place] submission pending

## 2021-09-20 NOTE — Telephone Encounter (Signed)
Medication: traMADol (ULTRAM) 50 MG tablet Prior authorization determination received Medication has been approved Approval dates: 09/16/2021-09/16/2022  Patient aware via: [place] aware: Yes Provider aware via this encounter

## 2021-10-24 ENCOUNTER — Ambulatory Visit (INDEPENDENT_AMBULATORY_CARE_PROVIDER_SITE_OTHER): Payer: BC Managed Care – PPO | Admitting: Cardiology

## 2021-10-24 ENCOUNTER — Encounter: Payer: Self-pay | Admitting: Cardiology

## 2021-10-24 ENCOUNTER — Other Ambulatory Visit: Payer: Self-pay

## 2021-10-24 VITALS — BP 140/90 | HR 78 | Ht 67.0 in | Wt 189.0 lb

## 2021-10-24 DIAGNOSIS — I493 Ventricular premature depolarization: Secondary | ICD-10-CM

## 2021-10-24 DIAGNOSIS — G4733 Obstructive sleep apnea (adult) (pediatric): Secondary | ICD-10-CM

## 2021-10-24 DIAGNOSIS — I10 Essential (primary) hypertension: Secondary | ICD-10-CM

## 2021-10-24 NOTE — Patient Instructions (Signed)

## 2021-10-24 NOTE — Progress Notes (Signed)
Cardiology Office Note:    Date:  10/24/2021   ID:  Kayla Hood, DOB [birth year], MRN [id number]  PCP:  Donella Stade, PA-C  Cardiologist:  Jenean Lindau, MD   Referring MD: Donella Stade, PA-C    ASSESSMENT:    1. PVC (premature ventricular contraction)   2. Essential hypertension, benign   3. OSA (obstructive sleep apnea)    PLAN:    In order of problems listed above:  Primary prevention stressed with the patient.  Importance of compliance with diet medication stressed and she vocalized understanding. Essential hypertension: Blood pressure stable.  She has an element of whitecoat hypertension.  She has been advised by her oncologist to review her blood pressure medications.  Diet including salt intake issues and exercise was emphasized.  Her blood pressure readings from home were reviewed and they are in the range of systolic 1 70-3 30 which is completely acceptable.  I do not think there is any reason to uptitrate her medications at this time.  I discussed this with her at length and she understands. Sleep apnea: Sleep health issues were discussed and questions were answered to her satisfaction. PVCs: Stable at this time and asymptomatic. Patient will be seen in follow-up appointment in 6 months or earlier if the patient has any concerns    Medication Adjustments/Labs and Tests Ordered: Current medicines are reviewed at length with the patient today.  Concerns regarding medicines are outlined above.  No orders of the defined types were placed in this encounter.  No orders of the defined types were placed in this encounter.    Chief Complaint  Patient presents with   Follow-up     History of Present Illness:    Kayla Hood is a 55 y.o. female.  Patient has past medical history of PVCs, essential hypertension and history of sleep apnea.  She has undergone mastectomy for breast cancer on the right side.  She denies any chest pain orthopnea or PND.  She is a  very active job and exercises at work on a regular basis.  No chest pain.  At the time of my evaluation, the patient is alert awake oriented and in no distress.  Past Medical History:  Diagnosis Date   Abnormal chest x-ray 03/03/2013   Patient brings in her report of xray done in 10/2012 and 11/2012 which showed persistent abnormality in the left perihilar and infrahilar regions posteriorly. A follow up chest xray was recommended.  Formatting of this note might be different from the original. Formatting of this note might be different from the original. 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  F/u in 2-3 weeks.  Pt offered psych consult but declines.   Anxiety state 06/07/2015   Bruise of breast 03/29/2020   Cervical radiculopathy 12/19/2017   Chest tightness or pressure 02/24/2018   Chicken pox    Class 1 obesity due to excess calories without serious comorbidity with body mass index (BMI) of 30.0 to 30.9 in adult 11/01/2018   Coccydynia 03/09/2021   no trauma, on exam slightly prominent and recent weight loss probably increased pressure to site0 motrin q 8  x 2 weeks then prn - donut seat and activity modification. F/U prn- she declines rx - no pain   Degenerative disc disease, lumbar 05/16/2018   Formatting of this note might be different from the original. Last Assessment & Plan:  Formatting of this note might be different from the original. Acute mid low back pain. Likely discogenic, started conservatively, prednisone, Robaxin, formal PT. Thoracic and lumbar spine x-rays as the pain is near the thoracolumbar junction. She does have significant pain out of  proportion to degree of palpatio   Depression    Depression with anxiety 07/08/2015   PHQ-9 was 3. GAD-7 was 1. 06/2016.   Formatting of this note might be different from the original. Overview:  PHQ-9 was 3. GAD-7 was 1. 06/2016.   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    Ear itching 03/12/2017   Elevated alkaline phosphatase level 01/24/2021   Elevated LFTs 12/31/2019   Endometriosis 07/08/2015   Erythematous papules of skin 03/30/2020   Essential hypertension, benign 12/22/2015   Exposure to blood or body fluid 11/24/2013   Last Assessment & Plan:  Suspect low risk exposure but post exposure labs pend for reassurance and repeat these in 3-6 months.   Formatting of this note might be different from the original. Formatting of this note might be different from the original. Last Assessment & Plan:  Suspect low risk exposure but post exposure labs pend for reassurance and repeat these in 3-6 months.   Facial edema 02/04/2014   Family history of early CAD 03/04/2018   Fatigue 08/23/2021   Fibroids 07/08/2015   Fullness of breast 03/30/2020   Gallstones 12/31/2019   General medical exam 03/03/2013   Formatting of this note might be different from the original. Last Assessment & Plan:  Formatting of this note might be different from the original. Normal physical exam except for problems listed below. Labs ordered: cbc w/diff, cmet, lipid, TSH, vit D level (hx of deficiency). Also ordered EKG, chest xray to follow up on previously noted abnormality. Cardiology evaluation.   Hand pain 02/07/2021   Herpes simplex 03/09/2021   Discussed Tx. options. She would like to have valtrex, but would like to get it from a pharmacy outside this [facility].  Written  Rx for Valtrex 1000mg  2 tabs bid x 1 day given. #4 with 3 RF.  Advised her to take her medication today and then refill the Rx to ha   Hidradenitis suppurativa 12/22/2015   History of left tennis elbow 07/23/2017   Formatting of this note might be different from the original. Last Assessment & Plan:  Failure of conservative measures, ultrasound guided injection as above.   History of potentially hazardous body fluid exposure 11/24/2013   Last Assessment & Plan:  Formatting of this note might be different from the original. Suspect low risk exposure but post exposure labs pend for reassurance and repeat these in 3-6 months. Formatting of this note might be different from the original. Last Assessment & Plan:  Formatting of this note might be different from the original. Suspect low risk exposure but post exposure labs pend for reas   Hives 01/26/2014   Formatting of this note might be different from the original. Last Assessment &  Plan:  Formatting of this note might be different from the original. Uncertain etiology. Start Zyrtec nightly. If no improvement in 2 weeks will refer to Allergist.   Hot flashes due to surgical menopause 12/22/2015   Hypertension    Incontinence of urine    Irregular heart beat 03/03/2013   Formatting of this note might be different from the original. Last Assessment & Plan:  Patient feeling skipped beats which are increasing in frequency. Associated with feeling lightheaded, fatigued. Will check metabolic panel, TSH. Patient also reports that her husband says she snores. I am ordering a sleep study to r/o OSA which may cause irregular heart beat. EKG was done and shows a minimal ST    Lateral epicondylitis, left elbow 07/23/2017   Left elbow pain 04/28/2020   Formatting of this note might be different from the original. Added automatically from request for surgery [id number]   Left hip pain 06/28/2013   Formatting of this note might be different from the original. Last  Assessment & Plan:  Suspect this is tendinitis involving the iliopsoas tendon. Send for left hip x-ray to rule out any structural abnormalities. Start anti-inflammatories such as ibuprofen 600 mg every 6 hours for the next 3-4 days, ice for 15 minutes 3-4 times a day for the next 3-4 days and rest. Once inflammation subsides recomm   Left lateral epicondylitis 04/28/2020   Formatting of this note might be different from the original. Added automatically from request for surgery [id number]   Left-sided chest pain 01/25/2021   Low back pain 03/09/2021   Major depression, recurrent, chronic ([place]) 05/09/2017   Malignant neoplasm of skin 03/09/2021   Placed consult for removal at [facility]   Migraine headache 03/09/2021   Pt with hx of migraine ha needs refill of meds today   Mixed dyslipidemia 02/08/2021   Need for prophylactic vaccination and inoculation against influenza 03/09/2021   Nodule of finger of both hands 08/18/2016   Numbness of left hand 09/03/2017   Formatting of this note might be different from the original. Last Assessment & Plan:  C8 distribution versus ulnar nerve. She does have a history of a multilevel cervical fusion. Symptoms have been present for a long time now, it sounds as her neurosurgeon was planning a repeat MRI. I'm going to order a nerve conduction study, repeat x-rays, depending on what we see I may order an MRI with and wi   Obesity (BMI 30-39.9) 02/08/2021   OCD (obsessive compulsive disorder) 12/14/2017   Open wound 11/24/2013   Formatting of this note might be different from the original. Last Assessment & Plan:  Formatting of this note might be different from the original. Keep clean and observe for sx of infection. Rx for Augmentin to begin prn infection. FU for any wound problems. Last Assessment & Plan:  Formatting of this note might be different from the original. Keep clean and observe for sx of infection. Rx for A   OSA (obstructive sleep apnea) 06/15/2021   On  CPAP. 05/2021. Managed by neurology.    Osteopenia of multiple sites 06/14/2021   Other dysfunctions of sleep stages or arousal from sleep 03/09/2021   Pt offered Lunest or change to non abuse med.  Pt declines.  Will write for in house meds as noted.  Pt to f/u. PRN   Overweight (BMI 25.0-29.9) 03/27/2018   Palpitations 03/21/2013   Formatting of this note might be different from the original. Last Assessment & Plan:  Pt has seen  cardiology in past She was put on BB but it made her too tired She was given norvasc for bp but has not started it yet Probably related to recent increase in stress and anxiety   Pre-diabetes 10/30/2018   Premenopausal menorrhagia 03/09/2021   HOT flashes- was on OCp prior- she dc'd has appt c GYN provider in a few weeks - also recently dc'd her zoloft- should restart zoloft for vasomotor sx will given ambien prn sleep difficulties see below - also pt to keep appt c GYN provider do not suggest altering hormones by me today as she is managed by specialist - discussed calcium also see below- avoid spicy foods and caffeine   Primary insomnia 03/27/2018   Primary osteoarthritis of left hip 05/08/2019   Formatting of this note might be different from the original. Last Assessment & Plan:  Formatting of this note might be different from the original. Sue has 2 pain generators, the hip joint on the greater trochanteric bursa. We injected the left hip joint at the last visit, and her groin pain has completely resolved. She still has some lateral pain referrable to the bursa, hip abductors are weak   PVC (premature ventricular contraction) 01/25/2021   RLQ abdominal pain 01/26/2014   Formatting of this note might be different from the original. Last Assessment & Plan:  RLQ tenderness with palpation and frequent nausea - concerning for appendicitis. Also, her report of symptoms point toward gallbladder. Recommend CT of abdomen/pelvis. Check cbc and cmet. She wanted to wait to have CT  done on Wednesday. Instructed to call if symptoms worsen.   S/P mastectomy, right 08/22/2021   Sciatica 03/09/2021   referral to ortho; lumbar x-ray pending   Screening for diabetes mellitus 12/22/2015   Shoulder pain 12/19/2017   Sleep disturbance 03/03/2013   Formatting of this note might be different from the original. Last Assessment & Plan:  Formatting of this note might be different from the original. Re-signed CSC since I could not find the one on file. She has been on Ambien for years and it is helpful 5 mg a night (halves her 10 mg). Will continue this.   Tendonitis of shoulder 09/01/2013   Vaginal dryness, menopausal 12/22/2015    Past Surgical History:  Procedure Laterality Date   ABLATION     CERVICAL FUSION  2012   CHOLECYSTECTOMY     pelvic laproscopic surgeries     4 prior to 2008    Current Medications: Current Meds  Medication Sig   amLODipine (NORVASC) 10 MG tablet Take 1 tablet (10 mg total) by mouth daily.   metoprolol succinate (TOPROL-XL) 25 MG 24 hr tablet Take 25-50 mg by mouth daily.   zolpidem (AMBIEN) 10 MG tablet Take 1 tablet (10 mg total) by mouth at bedtime as needed for sleep.     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  Tobacco Use   Smoking status: Never   Smokeless tobacco: Never  Vaping Use   Vaping Use: Never used  Substance and Sexual Activity   Alcohol use: No    Alcohol/week: 2.0 standard drinks    Types: 2  Cans of beer per week    Comment: Occasionally    Drug use: No   Sexual activity: Yes    Birth control/protection: None  Other Topics Concern   Not on file  Social History Narrative   Not on file   Social  Determinants of Health   Financial Resource Strain: Not on file  Food Insecurity: Not on file  Transportation Needs: Not on file  Physical Activity: Not on file  Stress: Not on file  Social Connections: Not on file     Family History: The patient's family history includes Cancer in her maternal grandmother; Cancer (age of onset: 12) in her maternal grandfather; Cancer (age of onset: 57) in her paternal grandmother; Diverticulitis in her father; Heart disease (age of onset: 71) in her father; Hyperlipidemia in her mother; Hypertension in her brother and father; Stroke (age of onset: 63) in her paternal grandfather; Ulcerative colitis in her mother.  ROS:   Please see the history of present illness.    All other systems reviewed and are negative.  EKGs/Labs/Other Studies Reviewed:    The following studies were reviewed today: I discussed my findings with the patient extensively   Recent Labs: 10/26/2020: ALT 15; ALT 15; BUN 16; Creat 0.70; Potassium 4.1; Sodium 139; TSH 0.65  Recent Lipid Panel    Component Value Date/Time   CHOL 246 (H) 08/13/2019 1408   TRIG 377 (H) 08/13/2019 1408   HDL 43 (L) 08/13/2019 1408   CHOLHDL 5.7 (H) 08/13/2019 1408   VLDL 37 (H) 12/22/2015 0929   LDLCALC 145 (H) 08/13/2019 1408   LDLDIRECT 182.5 03/03/2013 1017    Physical Exam:    VS:  BP 140/90 (BP Location: Left Arm, Patient Position: Sitting, Cuff Size: Normal)   Pulse 78   Ht 5\' 7"  (1.702 m)   Wt 189 lb (85.7 kg)   SpO2 99%   BMI 29.60 kg/m     Wt Readings from Last 3 Encounters:  10/24/21 189 lb (85.7 kg)  08/22/21 196 lb (88.9 kg)  03/11/21 201 lb 1.3 oz (91.2 kg)     GEN: Patient is in no acute distress HEENT: Normal NECK: No JVD; No carotid bruits LYMPHATICS: No lymphadenopathy CARDIAC: Hear sounds regular, 2/6 systolic murmur at the apex. RESPIRATORY:  Clear to auscultation without rales, wheezing or rhonchi  ABDOMEN: Soft, non-tender, non-distended MUSCULOSKELETAL:  No  edema; No deformity  SKIN: Warm and dry NEUROLOGIC:  Alert and oriented x 3 PSYCHIATRIC:  Normal affect   Signed, Jenean Lindau, MD  10/24/2021 3:19 PM    [facility]

## 2021-10-25 ENCOUNTER — Ambulatory Visit (INDEPENDENT_AMBULATORY_CARE_PROVIDER_SITE_OTHER): Payer: BC Managed Care – PPO | Admitting: Physician Assistant

## 2021-10-25 VITALS — BP 140/86 | HR 80 | Ht 67.0 in | Wt 191.0 lb

## 2021-10-25 DIAGNOSIS — D0511 Intraductal carcinoma in situ of right breast: Secondary | ICD-10-CM | POA: Diagnosis not present

## 2021-10-25 DIAGNOSIS — Z9011 Acquired absence of right breast and nipple: Secondary | ICD-10-CM

## 2021-10-25 DIAGNOSIS — F5101 Primary insomnia: Secondary | ICD-10-CM | POA: Diagnosis not present

## 2021-10-25 DIAGNOSIS — R030 Elevated blood-pressure reading, without diagnosis of hypertension: Secondary | ICD-10-CM

## 2021-10-25 DIAGNOSIS — M549 Dorsalgia, unspecified: Secondary | ICD-10-CM

## 2021-10-25 DIAGNOSIS — E663 Overweight: Secondary | ICD-10-CM

## 2021-10-25 MED ORDER — CYCLOBENZAPRINE HCL 10 MG PO TABS: 5.0000 mg | ORAL_TABLET | Freq: Three times a day (TID) | ORAL | 2 refills | Status: DC | PRN

## 2021-10-25 MED ORDER — ZOLPIDEM TARTRATE 10 MG PO TABS: 10.0000 mg | ORAL_TABLET | Freq: Every evening | ORAL | 1 refills | Status: DC | PRN

## 2021-10-25 NOTE — Patient Instructions (Addendum)
Dr. Arelia Sneddon referral for oncologist.   Plenity for weight.

## 2021-10-25 NOTE — Progress Notes (Signed)
Subjective:    Patient ID: Kayla Hood, female    DOB: 05-Nov-1966, 55 y.o.   MRN: [id number]  HPI Pt is a 55 yo female with DCIS in remission, s/p right mastectomy, insomnia, HTN, OSA, MDD who presents to the clinic for follow up and to discuss some mid back pain.   She believes back pain is b/c of having to sleep in recliner after her expanders are giving her problems. She does quite a bit of lifting at work too. She is requesting a muscle relaxer.   BP readings at home are 123/80. No CP, palpitations,headaches, vision changes.   She is not happy with oncologist. She would like another oncologist to manage her follow ups.   .. 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   Hidradenitis suppurativa 12/22/2015   Hot flashes due to surgical menopause 12/22/2015   Dyslipidemia (high LDL; low HDL) 12/22/2015   Essential hypertension, benign 12/22/2015   Vaginal dryness, menopausal 12/22/2015   Anxiety 12/22/2015   Screening for diabetes mellitus 12/22/2015   Nodule of finger of both hands 08/18/2016   Abnormal weight gain 11/18/2016   Diverticulitis of large intestine without perforation or abscess 01/23/2017   Ear itching 03/12/2017   Major depression, recurrent, chronic ([facility]) 05/09/2017   Lateral epicondylitis, left elbow 07/23/2017   Numbness of left hand 09/03/2017   Family history of early CAD 03/04/2018   Primary insomnia 03/27/2018   Overweight (BMI 25.0-29.9) 03/27/2018    Degenerative disc disease, lumbar 05/16/2018   Pre-diabetes 10/30/2018   Class 1 obesity due to excess calories without serious comorbidity with body mass index (BMI) of 30.0 to 30.9 in adult 11/01/2018   Primary osteoarthritis of left hip 05/08/2019   Gallstones 12/31/2019   Elevated LFTs 12/31/2019   Bruise of breast 03/29/2020   Erythematous papules of skin 03/30/2020   Fullness of breast 03/30/2020   Cervical radiculopathy 12/19/2017   History of left tennis elbow 07/23/2017   Elevated alkaline phosphatase level 01/24/2021   Left-sided chest pain 01/25/2021   PVC (premature ventricular contraction) 01/25/2021   Allergy    Chicken pox    Depression 12/14/2017   Hypertension    Incontinence of urine    History of potentially hazardous body fluid exposure 11/24/2013   Open wound 11/24/2013   OCD (obsessive compulsive disorder) 12/14/2017   Chest tightness or pressure 02/24/2018   Left lateral epicondylitis 04/28/2020   Hand pain 02/07/2021   Left elbow pain 04/28/2020   Shoulder pain 12/19/2017   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   Mixed dyslipidemia 02/08/2021   Obesity (BMI 30-39.9) 02/08/2021   Herpes simplex 03/09/2021   Coccydynia 03/09/2021   Low back pain 03/09/2021   Malignant neoplasm of skin 03/09/2021   Migraine headache 03/09/2021   Need for prophylactic vaccination and inoculation against influenza 03/09/2021   Other dysfunctions of sleep stages or arousal from sleep 03/09/2021   Premenopausal menorrhagia 03/09/2021   Sciatica 03/09/2021   Anxiety disorder  due to medical condition 03/09/2021   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   OSA (obstructive sleep apnea) 06/15/2021   S/P mastectomy, right 08/22/2021   Fatigue 08/23/2021   Adjustment reaction with anxiety and depression 08/23/2021   Osteopenia of multiple sites 06/14/2021   Elevated blood pressure reading 10/28/2021   Mid back pain 10/28/2021   Resolved Ambulatory  Problems    Diagnosis Date Noted   Viral URI 03/09/2015   No Additional Past Medical History     Review of Systems See HPI.     Objective:   Physical Exam Vitals reviewed.  Constitutional:      Appearance: Normal appearance.  Cardiovascular:     Rate and Rhythm: Normal rate and regular rhythm.     Pulses: Normal pulses.  Pulmonary:     Effort: Pulmonary effort is normal.     Breath sounds: Normal breath sounds.  Musculoskeletal:     Comments: Tenderness and tightness over the mid back more to the left than right.   Neurological:     General: No focal deficit present.     Mental Status: She is alert and oriented to person, place, and time.  Psychiatric:        Mood and Affect: Mood normal.          Assessment & Plan:  Marland KitchenMarland KitchenMarieke was seen today for back pain.  Diagnoses and all orders for this visit:  Mid back pain -     cyclobenzaprine (FLEXERIL) 10 MG tablet; Take 0.5-1 tablets (5-10 mg total) by mouth 3 (three) times daily as needed for muscle spasms. Caution: can cause drowsiness  Primary insomnia -     zolpidem (AMBIEN) 10 MG tablet; Take 1 tablet (10 mg total) by mouth at bedtime as needed for sleep.  Ductal carcinoma in situ (DCIS) of right breast -     Ambulatory referral to Hematology / Oncology  S/P mastectomy, right -     Ambulatory referral to Hematology / Oncology  Elevated blood pressure reading  Overweight (BMI 25.0-29.9)  Refilled ambien.   Elevated BP in office but home readings are good. No changes.   Mid back pain. Seems muscular.  Flexeril sent.  Discussed tens unit, icy hot patches, massages.  Follow up as needed.   Referral made for [facility]. She is in remission but not happy with her after care.    Spent 30 minutes with patient reviewing chart, discussing medications,

## 2021-10-28 ENCOUNTER — Telehealth: Payer: Self-pay | Admitting: *Deleted

## 2021-10-28 ENCOUNTER — Encounter: Payer: Self-pay | Admitting: Physician Assistant

## 2021-10-28 DIAGNOSIS — R030 Elevated blood-pressure reading, without diagnosis of hypertension: Secondary | ICD-10-CM

## 2021-10-28 DIAGNOSIS — M549 Dorsalgia, unspecified: Secondary | ICD-10-CM | POA: Insufficient documentation

## 2021-10-28 HISTORY — DX: Elevated blood-pressure reading, without diagnosis of hypertension: R03.0

## 2021-10-28 HISTORY — DX: Dorsalgia, unspecified: M54.9

## 2021-10-28 NOTE — Telephone Encounter (Signed)
Called and left voice mail for a call back to schedule appointment with Dr. Marin Olp.

## 2021-11-03 ENCOUNTER — Ambulatory Visit (INDEPENDENT_AMBULATORY_CARE_PROVIDER_SITE_OTHER): Payer: BC Managed Care – PPO | Admitting: Psychiatry

## 2021-11-03 ENCOUNTER — Encounter (HOSPITAL_COMMUNITY): Payer: Self-pay | Admitting: Psychiatry

## 2021-11-03 VITALS — BP 146/98 | Temp 98.8°F | Ht 67.0 in | Wt 190.0 lb

## 2021-11-03 DIAGNOSIS — F411 Generalized anxiety disorder: Secondary | ICD-10-CM | POA: Diagnosis not present

## 2021-11-03 DIAGNOSIS — F339 Major depressive disorder, recurrent, unspecified: Secondary | ICD-10-CM

## 2021-11-03 DIAGNOSIS — F5102 Adjustment insomnia: Secondary | ICD-10-CM | POA: Diagnosis not present

## 2021-11-03 NOTE — Progress Notes (Signed)
[facility] Outpatient Follow up visit  Tele psych visit Patient Identification: Kayla Hood MRN:  [id number] Date of Evaluation:  11/03/2021 Referral Source: pcp Chief Complaint:    depression follow up  Visit Diagnosis:    ICD-10-CM   1. Major depression, recurrent, chronic (HCC)  F33.9     2. GAD (generalized anxiety disorder)  F41.1     3. Adjustment insomnia  F51.02         History of Present Illness:    We have restarted Lexapro last visit as she has been diagnosed with breast cancer and was feeling down with anxiety she has gone through the surgery recovering and moving forward she believes Lexapro has helped and she wanted  to continue the same dose is 10 mg she does have ample support including her husband and her family members there is no reported side effects   Later  her oncologist suggested to stop lexapro due to increase apetite. 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  Recent surgery and diagnosis of breast cancer Modifying factors; family, husband Duration 4 plus years  Past Psychiatric History: depression, anxiety   Previous Psychotropic Medications: Yes  VIIbryd made her agitated.  zoloft didn't help later Substance Abuse History in the last 12 months:  No.  Consequences of Substance Abuse: NA  Past Medical History:  Past Medical History:  Diagnosis Date   Abnormal chest x-ray 03/03/2013   Patient brings in her report of xray done in 10/2012 and 11/2012 which showed persistent abnormality in the left perihilar and infrahilar regions posteriorly. A follow up chest xray was recommended.  Formatting of this note might be different from the original. Formatting of this note might be different from the original. 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  F/u in 2-3 weeks.  Pt offered psych consult but declines.   Anxiety state 06/07/2015   Bruise of breast 03/29/2020   Cervical radiculopathy 12/19/2017   Chest tightness or pressure 02/24/2018   Chicken pox    Class 1 obesity due to excess calories without serious comorbidity with body mass index (BMI) of 30.0 to 30.9 in adult 11/01/2018   Coccydynia 03/09/2021   no trauma, on exam slightly prominent and recent weight loss probably increased pressure to site0 motrin q 8 x 2 weeks then prn - donut seat and activity modification. F/U prn- she declines rx - no pain   Degenerative disc disease, lumbar 05/16/2018   Formatting of this note might be different from the original. Last Assessment & Plan:  Formatting of this note might be different from the original. Acute mid low back pain. Likely discogenic, started conservatively, prednisone, Robaxin, formal PT. Thoracic and lumbar spine x-rays as the pain is near the thoracolumbar junction. She does have significant pain out of proportion to degree of palpatio   Depression    Depression with anxiety 07/08/2015   PHQ-9 was 3. GAD-7 was 1. 06/2016.  Formatting of this note might be different from the original. Overview:  PHQ-9 was 3. GAD-7 was 1. 06/2016.   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    Ear itching 03/12/2017   Elevated alkaline phosphatase level  01/24/2021   Elevated LFTs 12/31/2019   Endometriosis 07/08/2015   Erythematous papules of skin 03/30/2020   Essential hypertension, benign 12/22/2015   Exposure to blood or body fluid 11/24/2013   Last Assessment & Plan:  Suspect low risk exposure but post exposure labs pend for reassurance and repeat these in 3-6 months.   Formatting of this note might be different from the original. Formatting of this note might be different from the original. Last Assessment & Plan:  Suspect low risk exposure but post exposure labs pend for reassurance and repeat these in 3-6 months.   Facial edema 02/04/2014   Family history of early CAD 03/04/2018   Fatigue 08/23/2021   Fibroids 07/08/2015   Fullness of breast 03/30/2020   Gallstones 12/31/2019   General medical exam 03/03/2013   Formatting of this note might be different from the original. Last Assessment & Plan:  Formatting of this note might be different from the original. Normal physical exam except for problems listed below. Labs ordered: cbc w/diff, cmet, lipid, TSH, vit D level (hx of deficiency). Also ordered EKG, chest xray to follow up on previously noted abnormality. Cardiology evaluation.   Hand pain 02/07/2021   Herpes simplex 03/09/2021   Discussed Tx. options. She would like to have valtrex, but would like to get it from a pharmacy outside this [facility].  Written Rx for Valtrex 1000mg  2 tabs bid x 1 day given. #4 with 3 RF.  Advised her to take her medication today and then refill the Rx to ha   Hidradenitis suppurativa 12/22/2015   History of left tennis elbow 07/23/2017   Formatting of this note might be different from the original. Last Assessment & Plan:  Failure of conservative measures, ultrasound guided injection as above.   History of potentially hazardous body fluid exposure 11/24/2013   Last Assessment & Plan:  Formatting of this note might be different from the original. Suspect low risk exposure but post exposure labs pend for  reassurance and repeat these in 3-6 months. Formatting of this note might be different from the original. Last Assessment & Plan:  Formatting of this note might be different from the original. Suspect low risk exposure but post exposure labs pend for reas   Hives 01/26/2014   Formatting of this note might be different from the original. Last Assessment & Plan:  Formatting of this note might be different from the original. Uncertain etiology. Start Zyrtec nightly. If no improvement in 2 weeks will refer to Allergist.   Hot flashes due to surgical menopause 12/22/2015   Hypertension    Incontinence of urine    Irregular heart beat 03/03/2013   Formatting of this note might be different from the original. Last Assessment & Plan:  Patient feeling skipped beats which are increasing in frequency. Associated with feeling lightheaded, fatigued. Will check metabolic panel, TSH. Patient also reports that her husband says she snores. I am ordering a sleep study to r/o OSA which may cause irregular heart  beat. EKG was done and shows a minimal ST    Lateral epicondylitis, left elbow 07/23/2017   Left elbow pain 04/28/2020   Formatting of this note might be different from the original. Added automatically from request for surgery [id number]   Left hip pain 06/28/2013   Formatting of this note might be different from the original. Last Assessment & Plan:  Suspect this is tendinitis involving the iliopsoas tendon. Send for left hip x-ray to rule out any structural abnormalities. Start anti-inflammatories such as ibuprofen 600 mg every 6 hours for the next 3-4 days, ice for 15 minutes 3-4 times a day for the next 3-4 days and rest. Once inflammation subsides recomm   Left lateral epicondylitis 04/28/2020   Formatting of this note might be different from the original. Added automatically from request for surgery [id number]   Left-sided chest pain 01/25/2021   Low back pain 03/09/2021   Major depression, recurrent, chronic  ([place]) 05/09/2017   Malignant neoplasm of skin 03/09/2021   Placed consult for removal at [facility]   Migraine headache 03/09/2021   Pt with hx of migraine ha needs refill of meds today   Mixed dyslipidemia 02/08/2021   Need for prophylactic vaccination and inoculation against influenza 03/09/2021   Nodule of finger of both hands 08/18/2016   Numbness of left hand 09/03/2017   Formatting of this note might be different from the original. Last Assessment & Plan:  C8 distribution versus ulnar nerve. She does have a history of a multilevel cervical fusion. Symptoms have been present for a long time now, it sounds as her neurosurgeon was planning a repeat MRI. I'm going to order a nerve conduction study, repeat x-rays, depending on what we see I may order an MRI with and wi   Obesity (BMI 30-39.9) 02/08/2021   OCD (obsessive compulsive disorder) 12/14/2017   Open wound 11/24/2013   Formatting of this note might be different from the original. Last Assessment & Plan:  Formatting of this note might be different from the original. Keep clean and observe for sx of infection. Rx for Augmentin to begin prn infection. FU for any wound problems. Last Assessment & Plan:  Formatting of this note might be different from the original. Keep clean and observe for sx of infection. Rx for A   OSA (obstructive sleep apnea) 06/15/2021   On CPAP. 05/2021. Managed by neurology.    Osteopenia of multiple sites 06/14/2021   Other dysfunctions of sleep stages or arousal from sleep 03/09/2021   Pt offered Lunest or change to non abuse med.  Pt declines.  Will write for in house meds as noted.  Pt to f/u. PRN   Overweight (BMI 25.0-29.9) 03/27/2018   Palpitations 03/21/2013   Formatting of this note might be different from the original. Last Assessment & Plan:  Pt has seen cardiology in past She was put on BB but it made her too tired She was given norvasc for bp but has not started it yet Probably related to recent increase in  stress and anxiety   Pre-diabetes 10/30/2018   Premenopausal menorrhagia 03/09/2021   HOT flashes- was on OCp prior- she dc'd has appt c GYN provider in a few weeks - also recently dc'd her zoloft- should restart zoloft for vasomotor sx will given ambien prn sleep difficulties see below - also pt to keep appt c GYN provider do not suggest altering hormones by me today as she is managed by specialist - discussed calcium also see  below- avoid spicy foods and caffeine   Primary insomnia 03/27/2018   Primary osteoarthritis of left hip 05/08/2019   Formatting of this note might be different from the original. Last Assessment & Plan:  Formatting of this note might be different from the original. [place] has 2 pain generators, the hip joint on the greater trochanteric bursa. We injected the left hip joint at the last visit, and her groin pain has completely resolved. She still has some lateral pain referrable to the bursa, hip abductors are weak   PVC (premature ventricular contraction) 01/25/2021   RLQ abdominal pain 01/26/2014   Formatting of this note might be different from the original. Last Assessment & Plan:  RLQ tenderness with palpation and frequent nausea - concerning for appendicitis. Also, her report of symptoms point toward gallbladder. Recommend CT of abdomen/pelvis. Check cbc and cmet. She wanted to wait to have CT done on Wednesday. Instructed to call if symptoms worsen.   S/P mastectomy, right 08/22/2021   Sciatica 03/09/2021   referral to ortho; lumbar x-ray pending   Screening for diabetes mellitus 12/22/2015   Shoulder pain 12/19/2017   Sleep disturbance 03/03/2013   Formatting of this note might be different from the original. Last Assessment & Plan:  Formatting of this note might be different from the original. Re-signed CSC since I could not find the one on file. She has been on Ambien for years and it is helpful 5 mg a night (halves her 10 mg). Will continue this.   Tendonitis of  shoulder 09/01/2013   Vaginal dryness, menopausal 12/22/2015    Past Surgical History:  Procedure Laterality Date   ABLATION     CERVICAL FUSION  2012   CHOLECYSTECTOMY     pelvic laproscopic surgeries     4 prior to 2008    Family Psychiatric History: Dad; alcohol use in past. Depression. MOm side has depression and some bipolar in family  Family History:  Family History  Problem Relation Age of Onset   Hyperlipidemia Mother    Ulcerative colitis Mother    Heart disease Father 46       CAD   Hypertension Father    Diverticulitis Father    Hypertension Brother    Cancer Maternal Grandfather 83       Stomach Cancer   Cancer Paternal Grandmother 79       Uterine Cancer   Stroke Paternal Grandfather 65       CVA   Cancer Maternal Grandmother     Social History:   Social History   Socioeconomic History   Marital status: Married    Spouse name: Marine Lezotte   Number of children: 1   Years of education: 14   Highest education level: Not on file  Occupational History   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  Tobacco Use   Smoking status: Never   Smokeless tobacco: Never  Vaping Use   Vaping Use: Never used  Substance and Sexual Activity   Alcohol use: No    Alcohol/week: 2.0 standard drinks    Types: 2 Cans of beer per week    Comment: Occasionally    Drug use: No   Sexual activity: Yes    Birth control/protection: None  Other Topics Concern   Not on file  Social History Narrative   Not on file   Social Determinants of Health   Financial Resource Strain: Not on file  Food Insecurity:  Not on file  Transportation Needs: Not on file  Physical Activity: Not on file  Stress: Not on file  Social Connections: Not on file     Allergies:   Allergies  Allergen Reactions   Promethazine Anaphylaxis and Other (See Comments)    Other Reaction: OTHER REACTION Other reaction(s): Unknown   Phenergan  [Promethazine Hcl] Rash    Metabolic Disorder Labs: Lab Results  Component Value Date   HGBA1C 5.9 (H) 08/13/2019   MPG 123 08/13/2019   MPG 120 10/29/2018   No results found for: PROLACTIN Lab Results  Component Value Date   CHOL 246 (H) 08/13/2019   TRIG 377 (H) 08/13/2019   HDL 43 (L) 08/13/2019   CHOLHDL 5.7 (H) 08/13/2019   VLDL 37 (H) 12/22/2015   LDLCALC 145 (H) 08/13/2019   LDLCALC 173 (H) 10/29/2018     Current Medications: Current Outpatient Medications  Medication Sig Dispense Refill   amLODipine (NORVASC) 10 MG tablet Take 1 tablet (10 mg total) by mouth daily. 90 tablet 3   cyclobenzaprine (FLEXERIL) 10 MG tablet Take 0.5-1 tablets (5-10 mg total) by mouth 3 (three) times daily as needed for muscle spasms. Caution: can cause drowsiness 60 tablet 2   metoprolol succinate (TOPROL-XL) 25 MG 24 hr tablet Take 25-50 mg by mouth daily.     zolpidem (AMBIEN) 10 MG tablet Take 1 tablet (10 mg total) by mouth at bedtime as needed for sleep. 90 tablet 1   No current facility-administered medications for this visit.      Psychiatric Specialty Exam: Review of Systems  Cardiovascular:  Negative for chest pain.  Psychiatric/Behavioral:  Positive for depression. Negative for suicidal ideas.    Blood pressure (!) 146/98, temperature 98.8 F (37.1 C), height 5\' 7"  (1.702 m), weight 190 lb (86.2 kg).Body mass index is 29.76 kg/m.  General Appearance: neat  Eye Contact: good  Speech:  Normal Rate  Volume:  Normal  Mood: Fair  Affect:  fair  Thought Process:  Goal Directed  Orientation:  Full (Time, Place, and Person)  Thought Content:  Rumination  Suicidal Thoughts:  No  Homicidal Thoughts:  No  Memory:  Immediate;   Fair Recent;   Fair  Judgement:  Fair  Insight:  Fair  Psychomotor Activity:  Normal  Concentration:  Concentration: Fair and Attention Span: Fair  Recall:  [employer] of Knowledge:Fair  Language: Fair  Akathisia:  Negative  Handed:  Right  AIMS  (if indicated):    Assets:  Desire for Improvement  ADL's:  Intact  Cognition: WNL  Sleep:  Fair with meds    Treatment Plan Summary: Medication management and Plan as follows   Prior documentation reviewed  1. Major depression, recurrent , moderate: somewhat subdued, still wants to hold off meds for now Has been on prozac, zoloft in the past, we discussed wellbutrin as it is weight neutral and she plans to hold off till next viist Wants to change her oncologist as well  2. GAD: manageabe, gets stressed . Not on med for now   3. Insomnia: Not worse    Fu 38m or earlier will discuss wellbutrin  Time spent in office face to face 67min Merian Capron, MD 11/10/20223:16 PM

## 2021-11-11 ENCOUNTER — Telehealth: Payer: Self-pay

## 2021-11-11 NOTE — Telephone Encounter (Signed)
Medication: zolpidem (AMBIEN) 10 MG tablet Prior authorization submitted via CoverMyMeds on 11/11/2021 [place] submission pending

## 2021-11-15 NOTE — Telephone Encounter (Signed)
Received notice of approval for zolpidem.  Effective 11/14/2021-11/14/2024.  Pharmacy and pt notified.  Charyl Bigger, CMA

## 2021-11-16 ENCOUNTER — Inpatient Hospital Stay: Payer: BC Managed Care – PPO | Admitting: Family

## 2021-11-16 ENCOUNTER — Inpatient Hospital Stay: Payer: BC Managed Care – PPO

## 2021-11-30 ENCOUNTER — Inpatient Hospital Stay: Payer: BC Managed Care – PPO | Attending: Hematology & Oncology

## 2021-11-30 ENCOUNTER — Other Ambulatory Visit: Payer: Self-pay | Admitting: Family

## 2021-11-30 ENCOUNTER — Inpatient Hospital Stay (HOSPITAL_BASED_OUTPATIENT_CLINIC_OR_DEPARTMENT_OTHER): Payer: BC Managed Care – PPO | Admitting: Family

## 2021-11-30 ENCOUNTER — Other Ambulatory Visit: Payer: Self-pay

## 2021-11-30 DIAGNOSIS — I10 Essential (primary) hypertension: Secondary | ICD-10-CM

## 2021-11-30 DIAGNOSIS — M8580 Other specified disorders of bone density and structure, unspecified site: Secondary | ICD-10-CM | POA: Diagnosis not present

## 2021-11-30 DIAGNOSIS — E559 Vitamin D deficiency, unspecified: Secondary | ICD-10-CM

## 2021-11-30 DIAGNOSIS — D0511 Intraductal carcinoma in situ of right breast: Secondary | ICD-10-CM | POA: Diagnosis not present

## 2021-11-30 DIAGNOSIS — Z9011 Acquired absence of right breast and nipple: Secondary | ICD-10-CM

## 2021-11-30 DIAGNOSIS — Z800 Family history of malignant neoplasm of digestive organs: Secondary | ICD-10-CM | POA: Diagnosis not present

## 2021-11-30 DIAGNOSIS — Z170 Estrogen receptor positive status [ER+]: Secondary | ICD-10-CM

## 2021-11-30 DIAGNOSIS — Z808 Family history of malignant neoplasm of other organs or systems: Secondary | ICD-10-CM | POA: Diagnosis not present

## 2021-11-30 LAB — CBC WITH DIFFERENTIAL (CANCER CENTER ONLY)
Abs Immature Granulocytes: 0.01 10*3/uL (ref 0.00–0.07)
Basophils Absolute: 0.1 10*3/uL (ref 0.0–0.1)
Basophils Relative: 1 %
Eosinophils Absolute: 0.2 10*3/uL (ref 0.0–0.5)
Eosinophils Relative: 3 %
HCT: 39.7 % (ref 36.0–46.0)
Hemoglobin: 13.4 g/dL (ref 12.0–15.0)
Immature Granulocytes: 0 %
Lymphocytes Relative: 29 %
Lymphs Abs: 2.1 10*3/uL (ref 0.7–4.0)
MCH: 29.8 pg (ref 26.0–34.0)
MCHC: 33.8 g/dL (ref 30.0–36.0)
MCV: 88.2 fL (ref 80.0–100.0)
Monocytes Absolute: 0.6 10*3/uL (ref 0.1–1.0)
Monocytes Relative: 9 %
Neutro Abs: 4.3 10*3/uL (ref 1.7–7.7)
Neutrophils Relative %: 58 %
Platelet Count: 352 10*3/uL (ref 150–400)
RBC: 4.5 MIL/uL (ref 3.87–5.11)
RDW: 12.6 % (ref 11.5–15.5)
WBC Count: 7.3 10*3/uL (ref 4.0–10.5)
nRBC: 0 % (ref 0.0–0.2)

## 2021-11-30 LAB — CMP (CANCER CENTER ONLY)
ALT: 13 U/L (ref 0–44)
AST: 15 U/L (ref 15–41)
Albumin: 4.5 g/dL (ref 3.5–5.0)
Alkaline Phosphatase: 101 U/L (ref 38–126)
Anion gap: 8 (ref 5–15)
BUN: 23 mg/dL — ABNORMAL HIGH (ref 6–20)
CO2: 25 mmol/L (ref 22–32)
Calcium: 10.3 mg/dL (ref 8.9–10.3)
Chloride: 107 mmol/L (ref 98–111)
Creatinine: 0.74 mg/dL (ref 0.44–1.00)
GFR, Estimated: >60 mL/min (ref >60)
Glucose, Bld: 99 mg/dL (ref 70–99)
Potassium: 4.7 mmol/L (ref 3.5–5.1)
Sodium: 140 mmol/L (ref 135–145)
Total Bilirubin: 0.6 mg/dL (ref 0.3–1.2)
Total Protein: 7 g/dL (ref 6.5–8.1)

## 2021-11-30 NOTE — Progress Notes (Signed)
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   She had right mastectomy and tissue expander placed on 08/11/2021 with Dr. Charlane Ferretti. She is scheduled for right tissue expander exchange to breast implant and left breast reduction with Dr. Harl Bowie on 12/14/2021.  She did not have radiation. We discussed Femara today during her visit and she is willing to start. We will let her get through surgery and the holiday's and then start (in case she was to experience any adverse side effects).  She was also noted to have mild fullness of the right renal pelvis and ureter possibly secondary to a right adnexal mass.  No other personal history of cancer. No known family history of breast cancer.  Her bone density scan in June showed mild osteopenia and she is taking vitamin D and calcium supplementation daily at home.  She had been on both Clomid and Lupron to help with fertility. She has 1 son and history of 2 miscarriages within the first 8 weeks. She had history of endometriosis and states that she had a uterine ablation around 15 years ago that stopped her cycle. She states that she also had one ovary removed.  She issues with blood loss. No abnormal bruising, no petechiae.  Only known familial history of cancer was paternal grandmother with uterine and colon.  No fever, chills, n/v, cough, rash, dizziness, SOB, chest pain, abdominal pain or changes in bowel or bladder habits.  She has history of PVCs and palpitations. She states that this resolved since she started taking Toprol-XL.  No hot flashes or night sweats.  No  history of diabetes or thyroid disease.  She states that she has history of elevated alk/phos since having her gallbladder removed. LFT's today are within normal limits.  No tenderness, numbness or tingling in her extremities.  She has occasional mild puffiness in her ankles that comes and goes.  No falls or syncope to report.  No smoking, ETOH or recreational drug use.  She has a good appetite and is staying well hydrated. Her weight is stable at 185 lbs.   ROS: All other 10 point review of systems is negative.   PAST MEDICAL HISTORY:   Past Medical History:  Diagnosis Date   Abnormal chest x-ray 03/03/2013   Patient brings in her report of xray done in 10/2012 and 11/2012 which showed persistent abnormality in the left perihilar and infrahilar regions posteriorly. A follow up chest xray was recommended.  Formatting of this note might be different from the original. Formatting of this note might be different from the original. 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  F/u in 2-3 weeks.  Pt offered psych consult but declines.   Anxiety state 06/07/2015  Bruise of breast 03/29/2020   Cervical radiculopathy 12/19/2017   Chest tightness or pressure 02/24/2018   Chicken pox    Class 1 obesity due to excess calories without serious comorbidity with body mass index (BMI) of 30.0 to 30.9 in adult 11/01/2018   Coccydynia 03/09/2021   no trauma, on exam slightly prominent and recent weight loss probably increased pressure to site0 motrin q 8 x 2 weeks then prn - donut seat and activity modification. F/U prn- she declines rx - no pain   Degenerative disc disease,  lumbar 05/16/2018   Formatting of this note might be different from the original. Last Assessment & Plan:  Formatting of this note might be different from the original. Acute mid low back pain. Likely discogenic, started conservatively, prednisone, Robaxin, formal PT. Thoracic and lumbar spine x-rays as the pain is near the thoracolumbar junction. She does have significant pain out of proportion to degree of palpatio   Depression    Depression with anxiety 07/08/2015   PHQ-9 was 3. GAD-7 was 1. 06/2016.   Formatting of this note might be different from the original. Overview:  PHQ-9 was 3. GAD-7 was 1. 06/2016.   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    Ear itching 03/12/2017   Elevated alkaline phosphatase level 01/24/2021   Elevated LFTs 12/31/2019   Endometriosis 07/08/2015   Erythematous papules of skin 03/30/2020   Essential hypertension, benign 12/22/2015   Exposure to blood or body fluid 11/24/2013   Last Assessment & Plan:  Suspect low risk exposure but post exposure labs pend for reassurance and repeat these in 3-6 months.   Formatting of this note might be different from the original. Formatting of this note might be different from the original. Last Assessment & Plan:  Suspect low risk exposure but post exposure labs pend for reassurance and repeat these in 3-6 months.   Facial edema 02/04/2014   Family history of early CAD 03/04/2018   Fatigue 08/23/2021   Fibroids 07/08/2015   Fullness of breast 03/30/2020   Gallstones 12/31/2019   General medical exam 03/03/2013   Formatting of this note might be different from the original. Last Assessment & Plan:  Formatting of this note might be different from the original. Normal physical exam  except for problems listed below. Labs ordered: cbc w/diff, cmet, lipid, TSH, vit D level (hx of deficiency). Also ordered EKG, chest xray to follow up on previously noted abnormality. Cardiology evaluation.   Hand pain 02/07/2021   Herpes simplex 03/09/2021   Discussed Tx. options. She would like to have valtrex, but would like to get it from a pharmacy outside this [facility].  Written Rx for Valtrex $RemoveBef'1000mg'CFRmJPGSlE$  2 tabs bid x 1 day given. #4 with 3 RF.  Advised her to take her medication today and then refill the Rx to ha   Hidradenitis suppurativa 12/22/2015   History of left tennis elbow 07/23/2017   Formatting of this note might be different from the original. Last Assessment & Plan:  Failure of conservative measures, ultrasound guided injection as above.   History of potentially hazardous body fluid exposure 11/24/2013   Last Assessment & Plan:  Formatting of this note might be different from the original. Suspect low  risk exposure but post exposure labs pend for reassurance and repeat these in 3-6 months. Formatting of this note might be different from the original. Last Assessment & Plan:  Formatting of this note might be different from the original. Suspect low risk exposure but post exposure labs pend for reas   Hives 01/26/2014   Formatting of this note might be different from the original. Last Assessment & Plan:  Formatting of this note might be different from the original. Uncertain etiology. Start Zyrtec nightly. If no improvement in 2 weeks will refer to Allergist.   Hot flashes due to surgical menopause 12/22/2015   Hypertension    Incontinence of urine    Irregular heart beat 03/03/2013   Formatting of this note might be different from the original. Last Assessment & Plan:  Patient feeling skipped beats which are increasing in frequency. Associated with feeling lightheaded, fatigued. Will check metabolic panel, TSH. Patient also reports that her husband says she snores. I am ordering a sleep  study to r/o OSA which may cause irregular heart beat. EKG was done and shows a minimal ST    Lateral epicondylitis, left elbow 07/23/2017   Left elbow pain 04/28/2020   Formatting of this note might be different from the original. Added automatically from request for surgery [id number]   Left hip pain 06/28/2013   Formatting of this note might be different from the original. Last Assessment & Plan:  Suspect this is tendinitis involving the iliopsoas tendon. Send for left hip x-ray to rule out any structural abnormalities. Start anti-inflammatories such as ibuprofen 600 mg every 6 hours for the next 3-4 days, ice for 15 minutes 3-4 times a day for the next 3-4 days and rest. Once inflammation subsides recomm   Left lateral epicondylitis 04/28/2020   Formatting of this note might be different from the original. Added automatically from request for surgery [id number]   Left-sided chest pain 01/25/2021   Low back pain 03/09/2021   Major depression, recurrent, chronic ([place]) 05/09/2017   Malignant neoplasm of skin 03/09/2021   Placed consult for removal at [facility]   Migraine headache 03/09/2021   Pt with hx of migraine ha needs refill of meds today   Mixed dyslipidemia 02/08/2021   Need for prophylactic vaccination and inoculation against influenza 03/09/2021   Nodule of finger of both hands 08/18/2016   Numbness of left hand 09/03/2017   Formatting of this note might be different from the original. Last Assessment & Plan:  C8 distribution versus ulnar nerve. She does have a history of a multilevel cervical fusion. Symptoms have been present for a long time now, it sounds as her neurosurgeon was planning a repeat MRI. I'm going to order a nerve conduction study, repeat x-rays, depending on what we see I may order an MRI with and wi   Obesity (BMI 30-39.9) 02/08/2021   OCD (obsessive compulsive disorder) 12/14/2017   Open wound 11/24/2013   Formatting of this note might be different from the original.  Last Assessment & Plan:  Formatting of this note might be different from the original. Keep clean and observe for sx of infection. Rx for Augmentin to begin prn infection. FU for any wound problems. Last Assessment & Plan:  Formatting of this note might be different from the original. Keep clean and observe for sx of infection. Rx for A   OSA (obstructive sleep apnea) 06/15/2021   On CPAP. 05/2021. Managed by neurology.    Osteopenia of multiple sites 06/14/2021  Other dysfunctions of sleep stages or arousal from sleep 03/09/2021   Pt offered Lunest or change to non abuse med.  Pt declines.  Will write for in house meds as noted.  Pt to f/u. PRN   Overweight (BMI 25.0-29.9) 03/27/2018   Palpitations 03/21/2013   Formatting of this note might be different from the original. Last Assessment & Plan:  Pt has seen cardiology in past She was put on BB but it made her too tired She was given norvasc for bp but has not started it yet Probably related to recent increase in stress and anxiety   Pre-diabetes 10/30/2018   Premenopausal menorrhagia 03/09/2021   HOT flashes- was on OCp prior- she dc'd has appt c GYN provider in a few weeks - also recently dc'd her zoloft- should restart zoloft for vasomotor sx will given ambien prn sleep difficulties see below - also pt to keep appt c GYN provider do not suggest altering hormones by me today as she is managed by specialist - discussed calcium also see below- avoid spicy foods and caffeine   Primary insomnia 03/27/2018   Primary osteoarthritis of left hip 05/08/2019   Formatting of this note might be different from the original. Last Assessment & Plan:  Formatting of this note might be different from the original. Ilean has 2 pain generators, the hip joint on the greater trochanteric bursa. We injected the left hip joint at the last visit, and her groin pain has completely resolved. She still has some lateral pain referrable to the bursa, hip abductors are weak   PVC  (premature ventricular contraction) 01/25/2021   RLQ abdominal pain 01/26/2014   Formatting of this note might be different from the original. Last Assessment & Plan:  RLQ tenderness with palpation and frequent nausea - concerning for appendicitis. Also, her report of symptoms point toward gallbladder. Recommend CT of abdomen/pelvis. Check cbc and cmet. She wanted to wait to have CT done on Wednesday. Instructed to call if symptoms worsen.   S/P mastectomy, right 08/22/2021   Sciatica 03/09/2021   referral to ortho; lumbar x-ray pending   Screening for diabetes mellitus 12/22/2015   Shoulder pain 12/19/2017   Sleep disturbance 03/03/2013   Formatting of this note might be different from the original. Last Assessment & Plan:  Formatting of this note might be different from the original. Re-signed [facility] since I could not find the one on file. She has been on Ambien for years and it is helpful 5 mg a night (halves her 10 mg). Will continue this.   Tendonitis of shoulder 09/01/2013   Vaginal dryness, menopausal 12/22/2015    ALLERGIES: Allergies  Allergen Reactions   Promethazine Anaphylaxis and Other (See Comments)    Other Reaction: OTHER REACTION Other reaction(s): Unknown   Phenergan [Promethazine Hcl] Rash      MEDICATIONS:  Current Outpatient Medications on File Prior to Visit  Medication Sig Dispense Refill   amLODipine (NORVASC) 10 MG tablet Take 1 tablet (10 mg total) by mouth daily. 90 tablet 3   cyclobenzaprine (FLEXERIL) 10 MG tablet Take 0.5-1 tablets (5-10 mg total) by mouth 3 (three) times daily as needed for muscle spasms. Caution: can cause drowsiness 60 tablet 2   metoprolol succinate (TOPROL-XL) 25 MG 24 hr tablet Take 25-50 mg by mouth daily.     zolpidem (AMBIEN) 10 MG tablet Take 1 tablet (10 mg total) by mouth at bedtime as needed for sleep. 90 tablet 1   No current facility-administered medications  on file prior to visit.     PAST SURGICAL HISTORY Past Surgical  History:  Procedure Laterality Date   ABLATION     CERVICAL FUSION  2012   CHOLECYSTECTOMY     pelvic laproscopic surgeries     4 prior to 2008    FAMILY HISTORY: Family History  Problem Relation Age of Onset   Hyperlipidemia Mother    Ulcerative colitis Mother    Heart disease Father 9       CAD   Hypertension Father    Diverticulitis Father    Hypertension Brother    Cancer Maternal Grandfather 68       Stomach Cancer   Cancer Paternal Grandmother 43       Uterine Cancer   Stroke Paternal Grandfather 28       CVA   Cancer Maternal Grandmother     SOCIAL HISTORY:  reports that she has never smoked. She has never used smokeless tobacco. She reports that she does not drink alcohol and does not use drugs.  PERFORMANCE STATUS: The patient's performance status is 1 - Symptomatic but completely ambulatory  PHYSICAL EXAM: Most Recent Vital Signs: There were no vitals taken for this visit. There were no vitals taken for this visit.  General Appearance:    Alert, cooperative, no distress, appears stated age  Head:    Normocephalic, without obvious abnormality, atraumatic  Eyes:    PERRL, conjunctiva/corneas clear, EOM's intact, fundi    benign, both eyes  Ears:    Normal TM's and external ear canals, both ears  Nose:   Nares normal, septum midline, mucosa normal, no drainage    or sinus tenderness  Throat:   Lips, mucosa, and tongue normal; teeth and gums normal  Neck:   Supple, symmetrical, trachea midline, no adenopathy;    thyroid:  no enlargement/tenderness/nodules; no carotid   bruit or JVD  Back:     Symmetric, no curvature, ROM normal, no CVA tenderness  Lungs:     Clear to auscultation bilaterally, respirations unlabored  Chest Wall:    No tenderness or deformity   Heart:    Regular rate and rhythm, S1 and S2 normal, no murmur, rub   or gallop  Breast Exam:    No tenderness, masses, or nipple abnormality  Abdomen:     Soft, non-tender, bowel sounds active all  four quadrants,    no masses, no organomegaly  Genitalia:    Normal female without lesion, discharge or tenderness  Rectal:    Normal tone, normal prostate, no masses or tenderness;   guaiac negative stool  Extremities:   Extremities normal, atraumatic, no cyanosis or edema  Pulses:   2+ and symmetric all extremities  Skin:   Skin color, texture, turgor normal, no rashes or lesions  Lymph nodes:   Cervical, supraclavicular, and axillary nodes normal  Neurologic:   CNII-XII intact, normal strength, sensation and reflexes    throughout    LABORATORY DATA:  Results for orders placed or performed in visit on 11/30/21 (from the past 48 hour(s))  CBC with Differential (Cancer Center Only)     Status: None   Collection Time: 11/30/21  2:59 PM  Result Value Ref Range   WBC Count 7.3 4.0 - 10.5 K/uL   RBC 4.50 3.87 - 5.11 MIL/uL   Hemoglobin 13.4 12.0 - 15.0 g/dL   HCT 39.7 36.0 - 46.0 %   MCV 88.2 80.0 - 100.0 fL   MCH 29.8 26.0 - 34.0 pg  MCHC 33.8 30.0 - 36.0 g/dL   RDW 12.6 11.5 - 15.5 %   Platelet Count 352 150 - 400 K/uL   nRBC 0.0 0.0 - 0.2 %   Neutrophils Relative % 58 %   Neutro Abs 4.3 1.7 - 7.7 K/uL   Lymphocytes Relative 29 %   Lymphs Abs 2.1 0.7 - 4.0 K/uL   Monocytes Relative 9 %   Monocytes Absolute 0.6 0.1 - 1.0 K/uL   Eosinophils Relative 3 %   Eosinophils Absolute 0.2 0.0 - 0.5 K/uL   Basophils Relative 1 %   Basophils Absolute 0.1 0.0 - 0.1 K/uL   Immature Granulocytes 0 %   Abs Immature Granulocytes 0.01 0.00 - 0.07 K/uL    Comment: Performed at [facility] at [facility], [address], [place], [place] [address]  CMP ([place] only)     Status: Abnormal   Collection Time: 11/30/21  2:59 PM  Result Value Ref Range   Sodium 140 135 - 145 mmol/L   Potassium 4.7 3.5 - 5.1 mmol/L   Chloride 107 98 - 111 mmol/L   CO2 25 22 - 32 mmol/L   Glucose, Bld 99 70 - 99 mg/dL    Comment: Glucose reference range applies only to samples  taken after fasting for at least 8 hours.   BUN 23 (H) 6 - 20 mg/dL   Creatinine 0.74 0.44 - 1.00 mg/dL   Calcium 10.3 8.9 - 10.3 mg/dL   Total Protein 7.0 6.5 - 8.1 g/dL   Albumin 4.5 3.5 - 5.0 g/dL   AST 15 15 - 41 U/L   ALT 13 0 - 44 U/L   Alkaline Phosphatase 101 38 - 126 U/L   Total Bilirubin 0.6 0.3 - 1.2 mg/dL   GFR, Estimated >60 >60 mL/min    Comment: (NOTE) Calculated using the CKD-EPI Creatinine Equation (2021)    Anion gap 8 5 - 15    Comment: Performed at [facility] at [facility], [address], [place], [place] [address]      RADIOGRAPHY: No results found.     PATHOLOGY: Reviewed with patient   ASSESSMENT/PLAN: Ms. 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   She has had a right mastectomy and will be having tissue expander removed and replaced with an implant along with a left breast reduction on 12/14/2021.  We discussed Femara in detail at today's visit including benefit, possible side effects and mechanism of action. We will get her start on this once she has had surgery and the Holidays are over.  She has mild osteopenia and will also start Prolia once every 6 months. We discussed this medication and possible side effects as well.  She had an adnexal mass noted on abdominal CT in August. We will look at repeating her scan to re-evaluate.  Follow-up in 8 weeks.   All questions were answered. The patient knows to call the clinic with any problems, questions or concerns. We can certainly see the patient much sooner if necessary.  The patient was discussed at length with Dr. Marin Olp and he is in agreement with the aforementioned.   Lottie Dawson, NP

## 2021-12-01 ENCOUNTER — Encounter: Payer: Self-pay | Admitting: Physician Assistant

## 2021-12-01 LAB — VITAMIN D 25 HYDROXY (VIT D DEFICIENCY, FRACTURES): Vit D, 25-Hydroxy: 29.6 ng/mL — ABNORMAL LOW (ref 30–100)

## 2021-12-06 ENCOUNTER — Other Ambulatory Visit: Payer: Self-pay | Admitting: Family

## 2021-12-06 ENCOUNTER — Telehealth: Payer: Self-pay

## 2021-12-06 DIAGNOSIS — E559 Vitamin D deficiency, unspecified: Secondary | ICD-10-CM

## 2021-12-06 DIAGNOSIS — M8580 Other specified disorders of bone density and structure, unspecified site: Secondary | ICD-10-CM

## 2021-12-06 MED ORDER — ERGOCALCIFEROL 1.25 MG (50000 UT) PO CAPS: 50000.0000 [IU] | ORAL_CAPSULE | ORAL | 3 refills | Status: DC

## 2021-12-06 NOTE — Telephone Encounter (Signed)
Called and [facility] informing patient of lab results, requested a CB with any further questions or concerns.

## 2021-12-06 NOTE — Telephone Encounter (Signed)
-----   Message from Celso Amy, NP sent at 12/06/2021  2:17 PM EST ----- Vit d supplement 50,000 units PO once a week! Script sent to [facility].   ----- Message ----- From: Buel Ream, Lab In [place] Sent: 11/30/2021   3:07 PM EST To: Celso Amy, NP

## 2021-12-08 ENCOUNTER — Ambulatory Visit (HOSPITAL_COMMUNITY): Payer: BC Managed Care – PPO | Admitting: Psychiatry

## 2021-12-11 ENCOUNTER — Ambulatory Visit: Payer: Self-pay

## 2022-01-09 ENCOUNTER — Telehealth (HOSPITAL_COMMUNITY): Payer: Self-pay | Admitting: Psychiatry

## 2022-01-09 MED ORDER — BUPROPION HCL ER (SR) 100 MG PO TB12: 100.0000 mg | ORAL_TABLET | Freq: Every day | ORAL | 0 refills | Status: DC

## 2022-01-09 NOTE — Telephone Encounter (Signed)
Appt is now on books   Pt requested Wellbutrin in the lowest mg Said her and Dr. De Nurse discussed her trying the rx  Wants it to be sent to   [facility], [place], [place] [address] Phone: [phone]

## 2022-01-10 ENCOUNTER — Other Ambulatory Visit: Payer: Self-pay

## 2022-01-10 ENCOUNTER — Ambulatory Visit (INDEPENDENT_AMBULATORY_CARE_PROVIDER_SITE_OTHER): Payer: BC Managed Care – PPO | Admitting: Physician Assistant

## 2022-01-10 VITALS — BP 161/85 | HR 96 | Ht 64.0 in | Wt 181.1 lb

## 2022-01-10 DIAGNOSIS — N61 Mastitis without abscess: Secondary | ICD-10-CM

## 2022-01-10 DIAGNOSIS — N644 Mastodynia: Secondary | ICD-10-CM

## 2022-01-10 DIAGNOSIS — F5101 Primary insomnia: Secondary | ICD-10-CM | POA: Diagnosis not present

## 2022-01-10 DIAGNOSIS — F419 Anxiety disorder, unspecified: Secondary | ICD-10-CM

## 2022-01-10 DIAGNOSIS — F339 Major depressive disorder, recurrent, unspecified: Secondary | ICD-10-CM | POA: Diagnosis not present

## 2022-01-10 HISTORY — DX: Mastodynia: N64.4

## 2022-01-10 HISTORY — DX: Mastitis without abscess: N61.0

## 2022-01-10 MED ORDER — GABAPENTIN 100 MG PO CAPS: ORAL_CAPSULE | ORAL | 0 refills | Status: DC

## 2022-01-10 MED ORDER — DOXYCYCLINE HYCLATE 100 MG PO TABS: 100.0000 mg | ORAL_TABLET | Freq: Two times a day (BID) | ORAL | 0 refills | Status: DC

## 2022-01-10 NOTE — Progress Notes (Signed)
Subjective:    Patient ID: Kayla Hood, female    DOB: January 05, 1966, 56 y.o.   MRN: [id number]  HPI Pt is a 56 yo female who presents to the clinic to follow up on insomnia and left breast pain. She had right breast implant and left breast reduction surgery at end of December. She is doing ok. Right breast feels great. Left breast has lots of pain and worse at night. Her nipple can get really cold and pain is sharp and burning at times. She is worried about discharge and how red some of the incision looks. She has not been able to use CPAP mask. She has not been taking [place] and not sleeping. She was prescribed wellbutrin but not started yet for her mood. She feels emotional.   .. 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   Hidradenitis suppurativa 12/22/2015   Hot flashes due to surgical menopause 12/22/2015   Dyslipidemia (high LDL; low HDL) 12/22/2015   Essential hypertension, benign 12/22/2015   Vaginal dryness, menopausal 12/22/2015   Anxiety 12/22/2015   Screening for diabetes mellitus 12/22/2015   Nodule of finger of both hands 08/18/2016   Abnormal weight gain 11/18/2016   Diverticulitis of large intestine without perforation or abscess 01/23/2017   Ear itching 03/12/2017   Major depression, recurrent, chronic (HCC) 05/09/2017   Lateral epicondylitis, left elbow 07/23/2017   Numbness of left hand 09/03/2017   Family history of early CAD 03/04/2018   Primary insomnia  03/27/2018   Overweight (BMI 25.0-29.9) 03/27/2018   Degenerative disc disease, lumbar 05/16/2018   Pre-diabetes 10/30/2018   Class 1 obesity due to excess calories without serious comorbidity with body mass index (BMI) of 30.0 to 30.9 in adult 11/01/2018   Primary osteoarthritis of left hip 05/08/2019   Gallstones 12/31/2019   Elevated LFTs 12/31/2019   Bruise of breast 03/29/2020   Erythematous papules of skin 03/30/2020   Fullness of breast 03/30/2020   Cervical radiculopathy 12/19/2017   History of left tennis elbow 07/23/2017   Elevated alkaline phosphatase level 01/24/2021   Left-sided chest pain 01/25/2021   PVC (premature ventricular contraction) 01/25/2021   Allergy    Chicken pox    Depression 12/14/2017   Hypertension    Incontinence of urine    History of potentially hazardous body fluid exposure 11/24/2013   Open wound 11/24/2013   OCD (obsessive compulsive disorder) 12/14/2017   Chest tightness or pressure 02/24/2018   Left lateral epicondylitis 04/28/2020   Hand pain 02/07/2021   Left elbow pain 04/28/2020   Shoulder pain 12/19/2017   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   Mixed dyslipidemia 02/08/2021   Obesity (BMI 30-39.9) 02/08/2021   Herpes simplex 03/09/2021   Coccydynia 03/09/2021   Low back pain 03/09/2021   Malignant neoplasm of skin 03/09/2021   Migraine headache 03/09/2021   Need for prophylactic vaccination and inoculation against influenza 03/09/2021   Other dysfunctions of sleep stages or arousal from sleep 03/09/2021  Premenopausal menorrhagia 03/09/2021   Sciatica 03/09/2021   Anxiety disorder due to medical condition 03/09/2021   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     Objective:   Physical Exam Vitals reviewed.  Constitutional:      Appearance: Normal appearance.  HENT:     Head: Normocephalic.  Cardiovascular:     Rate and Rhythm: Normal rate and regular rhythm.  Pulmonary:     Effort: Pulmonary effort is normal.     Breath sounds: Normal breath sounds.  Skin:    Comments: Left breast reduction incision with an opening and purulent discharge under left breast along incision line. Erythematous, warm, and tender to touch.   Neurological:     General: No focal deficit present.     Mental Status: She is alert and oriented to person, place, and time.  Psychiatric:        Mood and Affect: Mood normal.          Assessment & Plan:  Marland KitchenMarland KitchenBritlyn was seen today for follow-up.  Diagnoses and all orders for this visit:  Breast infection in female -     doxycycline (VIBRA-TABS) 100 MG tablet; Take 1 tablet (100 mg total) by mouth 2 (two) times daily.  Primary insomnia  Breast pain, left -     gabapentin (NEURONTIN) 100 MG capsule; Take 1-3 tablets as needed up to three times a day.  Major depression, recurrent, chronic (HCC)  Anxiety   Some of her left breast pain could be due to infection. Start doxycycline. Continue warm compresses. Start gabapentin as needed 1-3 tablets up to three times a day. Work on sleep care. Continue ambien as needed.  Sleeping could help her mood.  Agreed with start of wellbutrin.  Follow up in 3 months or sooner if needed.

## 2022-01-11 ENCOUNTER — Encounter: Payer: Self-pay | Admitting: Physician Assistant

## 2022-01-11 ENCOUNTER — Other Ambulatory Visit: Payer: Self-pay | Admitting: Physician Assistant

## 2022-01-11 DIAGNOSIS — F5101 Primary insomnia: Secondary | ICD-10-CM

## 2022-01-24 ENCOUNTER — Encounter (HOSPITAL_COMMUNITY): Payer: Self-pay | Admitting: Psychiatry

## 2022-01-24 ENCOUNTER — Ambulatory Visit (INDEPENDENT_AMBULATORY_CARE_PROVIDER_SITE_OTHER): Payer: BC Managed Care – PPO | Admitting: Psychiatry

## 2022-01-24 ENCOUNTER — Other Ambulatory Visit: Payer: Self-pay

## 2022-01-24 VITALS — BP 108/70 | Temp 98.6°F | Ht 67.0 in | Wt 180.0 lb

## 2022-01-24 DIAGNOSIS — F411 Generalized anxiety disorder: Secondary | ICD-10-CM | POA: Diagnosis not present

## 2022-01-24 DIAGNOSIS — F339 Major depressive disorder, recurrent, unspecified: Secondary | ICD-10-CM

## 2022-01-24 DIAGNOSIS — F5102 Adjustment insomnia: Secondary | ICD-10-CM | POA: Diagnosis not present

## 2022-01-24 NOTE — Progress Notes (Signed)
[facility] Outpatient Follow up visit  Tele psych visit Patient Identification: Kayla Hood MRN:  [id number] Date of Evaluation:  01/24/2022 Referral Source: pcp Chief Complaint:    depression follow up  Visit Diagnosis:    ICD-10-CM   1. Major depression, recurrent, chronic (HCC)  F33.9     2. GAD (generalized anxiety disorder)  F41.1     3. Adjustment insomnia  F51.02         History of Present Illness:    She has restarted lexapro, which was stopped before due to weight concern and advised by oncology, she has changed oncologist as she feels lexapro helps and wants to focus on depression Feels tired, now on cpap adjusting Recovering from breast cancer and adapting has support  Not on tamoxifen Did get wellbutrin but did not start as she prefers lexapro  Relationship is going on fine Aggravating factors; [place] living.  Breast cancer surgery Modifying factors; family, husband Duration 4 plus years  Past Psychiatric History: depression, anxiety   Previous Psychotropic Medications: Yes  VIIbryd made her agitated.  zoloft didn't help later Substance Abuse History in the last 12 months:  No.  Consequences of Substance Abuse: NA  Past Medical History:  Past Medical History:  Diagnosis Date   Abnormal chest x-ray 03/03/2013   Patient brings in her report of xray done in 10/2012 and 11/2012 which showed persistent abnormality in the left perihilar and infrahilar regions posteriorly. A follow up chest xray was recommended.  Formatting of this note might be different from the original. Formatting of this note might be different from the original. 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  F/u in 2-3 weeks.  Pt offered psych consult but declines.   Anxiety state 06/07/2015   Bruise of breast 03/29/2020   Cervical radiculopathy 12/19/2017   Chest tightness or pressure 02/24/2018   Chicken pox    Class 1 obesity due to excess calories without serious comorbidity with body mass index (BMI) of 30.0 to 30.9 in adult 11/01/2018   Coccydynia 03/09/2021   no trauma, on exam slightly prominent and recent weight loss probably increased pressure to site0 motrin q 8 x 2 weeks then prn - donut seat and activity modification. F/U prn- she declines rx - no pain   Degenerative disc disease, lumbar 05/16/2018   Formatting of this note might be different from the original. Last Assessment & Plan:  Formatting of this note might be different from the original. Acute mid low back pain. Likely discogenic, started conservatively, prednisone, Robaxin, formal PT. Thoracic and lumbar spine x-rays as the pain is near the thoracolumbar junction. She does have significant pain out of proportion to degree of palpatio   Depression    Depression with anxiety 07/08/2015   PHQ-9 was 3. GAD-7 was 1. 06/2016.   Formatting of this note might be different from the original. Overview:  PHQ-9 was 3. GAD-7 was 1. 06/2016.   Last Assessment & Plan:  Inc lexapro 20 mg qd  con't ativan   Diverticulitis of large intestine without perforation or abscess 01/23/2017   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  Dyslipidemia (high LDL; low HDL) 12/22/2015   10 year cardiovascular risk 5.5 percent 02/2018.    Ear itching 03/12/2017   Elevated alkaline phosphatase level 01/24/2021   Elevated LFTs 12/31/2019   Endometriosis 07/08/2015   Erythematous papules of skin 03/30/2020   Essential hypertension, benign 12/22/2015   Exposure to blood or body fluid 11/24/2013   Last Assessment & Plan:  Suspect low risk exposure but post exposure labs pend for reassurance and  repeat these in 3-6 months.   Formatting of this note might be different from the original. Formatting of this note might be different from the original. Last Assessment & Plan:  Suspect low risk exposure but post exposure labs pend for reassurance and repeat these in 3-6 months.   Facial edema 02/04/2014   Family history of early CAD 03/04/2018   Fatigue 08/23/2021   Fibroids 07/08/2015   Fullness of breast 03/30/2020   Gallstones 12/31/2019   General medical exam 03/03/2013   Formatting of this note might be different from the original. Last Assessment & Plan:  Formatting of this note might be different from the original. Normal physical exam except for problems listed below. Labs ordered: cbc w/diff, cmet, lipid, TSH, vit D level (hx of deficiency). Also ordered EKG, chest xray to follow up on previously noted abnormality. Cardiology evaluation.   Hand pain 02/07/2021   Herpes simplex 03/09/2021   Discussed Tx. options. She would like to have valtrex, but would like to get it from a pharmacy outside this [facility].  Written Rx for Valtrex 1000mg  2 tabs bid x 1 day given. #4 with 3 RF.  Advised her to take her medication today and then refill the Rx to ha   Hidradenitis suppurativa 12/22/2015   History of left tennis elbow 07/23/2017   Formatting of this note might be different from the original. Last Assessment & Plan:  Failure of conservative measures, ultrasound guided injection as above.   History of potentially hazardous body fluid exposure 11/24/2013   Last Assessment & Plan:  Formatting of this note might be different from the original. Suspect low risk exposure but post exposure labs pend for reassurance and repeat these in 3-6 months. Formatting of this note might be different from the original. Last Assessment & Plan:  Formatting of this note might be different from the original. Suspect low risk exposure but post exposure labs pend for reas   Hives 01/26/2014   Formatting of this note might  be different from the original. Last Assessment & Plan:  Formatting of this note might be different from the original. Uncertain etiology. Start Zyrtec nightly. If no improvement in 2 weeks will refer to Allergist.   Hot flashes due to surgical menopause 12/22/2015   Hypertension    Incontinence of urine    Irregular heart beat 03/03/2013   Formatting of this note might be different from the original. Last Assessment & Plan:  Patient feeling skipped beats which are increasing in frequency. Associated with feeling lightheaded, fatigued. Will check metabolic panel, TSH. Patient also reports that her husband says she snores. I am ordering a sleep study to r/o OSA which may cause irregular heart beat. EKG was done and shows a minimal ST    Lateral epicondylitis, left elbow 07/23/2017   Left elbow pain 04/28/2020   Formatting of this note might be different from the original. Added automatically from request for surgery [id number]   Left hip pain 06/28/2013   Formatting of this note might be different from  the original. Last Assessment & Plan:  Suspect this is tendinitis involving the iliopsoas tendon. Send for left hip x-ray to rule out any structural abnormalities. Start anti-inflammatories such as ibuprofen 600 mg every 6 hours for the next 3-4 days, ice for 15 minutes 3-4 times a day for the next 3-4 days and rest. Once inflammation subsides recomm   Left lateral epicondylitis 04/28/2020   Formatting of this note might be different from the original. Added automatically from request for surgery [id number]   Left-sided chest pain 01/25/2021   Low back pain 03/09/2021   Major depression, recurrent, chronic ([place]) 05/09/2017   Malignant neoplasm of skin 03/09/2021   Placed consult for removal at [facility]   Migraine headache 03/09/2021   Pt with hx of migraine ha needs refill of meds today   Mixed dyslipidemia 02/08/2021   Need for prophylactic vaccination and inoculation against influenza 03/09/2021    Nodule of finger of both hands 08/18/2016   Numbness of left hand 09/03/2017   Formatting of this note might be different from the original. Last Assessment & Plan:  C8 distribution versus ulnar nerve. She does have a history of a multilevel cervical fusion. Symptoms have been present for a long time now, it sounds as her neurosurgeon was planning a repeat MRI. I'm going to order a nerve conduction study, repeat x-rays, depending on what we see I may order an MRI with and wi   Obesity (BMI 30-39.9) 02/08/2021   OCD (obsessive compulsive disorder) 12/14/2017   Open wound 11/24/2013   Formatting of this note might be different from the original. Last Assessment & Plan:  Formatting of this note might be different from the original. Keep clean and observe for sx of infection. Rx for Augmentin to begin prn infection. FU for any wound problems. Last Assessment & Plan:  Formatting of this note might be different from the original. Keep clean and observe for sx of infection. Rx for A   OSA (obstructive sleep apnea) 06/15/2021   On CPAP. 05/2021. Managed by neurology.    Osteopenia of multiple sites 06/14/2021   Other dysfunctions of sleep stages or arousal from sleep 03/09/2021   Pt offered Lunest or change to non abuse med.  Pt declines.  Will write for in house meds as noted.  Pt to f/u. PRN   Overweight (BMI 25.0-29.9) 03/27/2018   Palpitations 03/21/2013   Formatting of this note might be different from the original. Last Assessment & Plan:  Pt has seen cardiology in past She was put on BB but it made her too tired She was given norvasc for bp but has not started it yet Probably related to recent increase in stress and anxiety   Pre-diabetes 10/30/2018   Premenopausal menorrhagia 03/09/2021   HOT flashes- was on OCp prior- she dc'd has appt c GYN provider in a few weeks - also recently dc'd her zoloft- should restart zoloft for vasomotor sx will given ambien prn sleep difficulties see below - also pt to  keep appt c GYN provider do not suggest altering hormones by me today as she is managed by specialist - discussed calcium also see below- avoid spicy foods and caffeine   Primary insomnia 03/27/2018   Primary osteoarthritis of left hip 05/08/2019   Formatting of this note might be different from the original. Last Assessment & Plan:  Formatting of this note might be different from the original. Markeya has 2 pain generators, the hip joint on the greater trochanteric  bursa. We injected the left hip joint at the last visit, and her groin pain has completely resolved. She still has some lateral pain referrable to the bursa, hip abductors are weak   PVC (premature ventricular contraction) 01/25/2021   RLQ abdominal pain 01/26/2014   Formatting of this note might be different from the original. Last Assessment & Plan:  RLQ tenderness with palpation and frequent nausea - concerning for appendicitis. Also, her report of symptoms point toward gallbladder. Recommend CT of abdomen/pelvis. Check cbc and cmet. She wanted to wait to have CT done on Wednesday. Instructed to call if symptoms worsen.   S/P mastectomy, right 08/22/2021   Sciatica 03/09/2021   referral to ortho; lumbar x-ray pending   Screening for diabetes mellitus 12/22/2015   Shoulder pain 12/19/2017   Sleep disturbance 03/03/2013   Formatting of this note might be different from the original. Last Assessment & Plan:  Formatting of this note might be different from the original. Re-signed CSC since I could not find the one on file. She has been on Ambien for years and it is helpful 5 mg a night (halves her 10 mg). Will continue this.   Tendonitis of shoulder 09/01/2013   Vaginal dryness, menopausal 12/22/2015    Past Surgical History:  Procedure Laterality Date   ABLATION     CERVICAL FUSION  2012   CHOLECYSTECTOMY     pelvic laproscopic surgeries     4 prior to 2008    Family Psychiatric History: Dad; alcohol use in past. Depression. MOm  side has depression and some bipolar in family  Family History:  Family History  Problem Relation Age of Onset   Hyperlipidemia Mother    Ulcerative colitis Mother    Heart disease Father 51       CAD   Hypertension Father    Diverticulitis Father    Hypertension Brother    Cancer Maternal Grandfather 95       Stomach Cancer   Cancer Paternal Grandmother 32       Uterine Cancer   Stroke Paternal Grandfather 44       CVA   Cancer Maternal Grandmother     Social History:   Social History   Socioeconomic History   Marital status: Married    Spouse name: Ifeoma Vallin   Number of children: 1   Years of education: 14   Highest education level: Not on file  Occupational History   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  Tobacco Use   Smoking status: Never   Smokeless tobacco: Never  Vaping Use   Vaping Use: Never used  Substance and Sexual Activity   Alcohol use: No    Alcohol/week: 2.0 standard drinks    Types: 2 Cans of beer per week    Comment: Occasionally    Drug use: No   Sexual activity: Yes    Birth control/protection: None  Other Topics Concern   Not on file  Social History Narrative   Not on file   Social Determinants of Health   Financial Resource Strain: Not on file  Food Insecurity: Not on file  Transportation Needs: Not on file  Physical Activity: Not on file  Stress: Not on file  Social Connections: Not on file     Allergies:   Allergies  Allergen Reactions   Promethazine Anaphylaxis and Other (See Comments)    Other Reaction: OTHER REACTION Other reaction(s): Unknown   Phenergan [  Promethazine Hcl] Rash    Metabolic Disorder Labs: Lab Results  Component Value Date   HGBA1C 5.9 (H) 08/13/2019   MPG 123 08/13/2019   MPG 120 10/29/2018   No results found for: PROLACTIN Lab Results  Component Value Date   CHOL 246 (H) 08/13/2019   TRIG 377 (H) 08/13/2019   HDL 43 (L) 08/13/2019    CHOLHDL 5.7 (H) 08/13/2019   VLDL 37 (H) 12/22/2015   LDLCALC 145 (H) 08/13/2019   LDLCALC 173 (H) 10/29/2018     Current Medications: Current Outpatient Medications  Medication Sig Dispense Refill   zolpidem (AMBIEN) 10 MG tablet TAKE 1 TABLET(10 MG) BY MOUTH AT BEDTIME AS NEEDED FOR SLEEP 90 tablet 1   amLODipine (NORVASC) 10 MG tablet Take 1 tablet (10 mg total) by mouth daily. 90 tablet 3   buPROPion ER (WELLBUTRIN SR) 100 MG 12 hr tablet Take 1 tablet (100 mg total) by mouth daily. 30 tablet 0   doxycycline (VIBRA-TABS) 100 MG tablet Take 1 tablet (100 mg total) by mouth 2 (two) times daily. 20 tablet 0   ergocalciferol (VITAMIN D2) 1.25 MG (50000 UT) capsule Take 1 capsule (50,000 Units total) by mouth once a week. 12 capsule 3   escitalopram (LEXAPRO) 10 MG tablet escitalopram 10 mg tablet     gabapentin (NEURONTIN) 100 MG capsule Take 1-3 tablets as needed up to three times a day. 270 capsule 0   metoprolol succinate (TOPROL-XL) 25 MG 24 hr tablet Take 25-50 mg by mouth daily.     No current facility-administered medications for this visit.      Psychiatric Specialty Exam: Review of Systems  Cardiovascular:  Negative for chest pain and palpitations.  Neurological:  Negative for tremors.  Psychiatric/Behavioral:  Positive for depression. Negative for suicidal ideas.    Blood pressure 108/70, temperature 98.6 F (37 C), height 5\' 7"  (1.702 m), weight 180 lb (81.6 kg).Body mass index is 28.19 kg/m.  General Appearance: neat  Eye Contact: good  Speech:  Normal Rate  Volume:  Normal  Mood: Fair  Affect:  fair  Thought Process:  Goal Directed  Orientation:  Full (Time, Place, and Person)  Thought Content:  Rumination  Suicidal Thoughts:  No  Homicidal Thoughts:  No  Memory:  Immediate;   Fair Recent;   Fair  Judgement:  Fair  Insight:  Fair  Psychomotor Activity:  Normal  Concentration:  Concentration: Fair and Attention Span: Fair  Recall:  [employer] of  Knowledge:Fair  Language: Fair  Akathisia:  Negative  Handed:  Right  AIMS (if indicated):    Assets:  Desire for Improvement  ADL's:  Intact  Cognition: WNL  Sleep:  Fair with meds    Treatment Plan Summary: Medication management and Plan as follows   Prior documentation reviewed   1. Major depression, recurrent , moderate:somewhat subdued, just started lexapro back 3 days ago, feels it has helped before Plans to  work on weight maintenance but feels comofrtable with lexapro   2. GAD: fluctuates, stress related to health continue lexapro    3. Insomnia: not worse , adjusting to cpap , reviewed sleep hygiene Fu 6w  Time spent in office face to face 20 min including chart review, documentation Merian Capron, MD 1/31/20234:15 PM

## 2022-01-30 ENCOUNTER — Encounter: Payer: Self-pay | Admitting: Family

## 2022-01-30 ENCOUNTER — Other Ambulatory Visit: Payer: Self-pay

## 2022-01-30 DIAGNOSIS — D0511 Intraductal carcinoma in situ of right breast: Secondary | ICD-10-CM

## 2022-01-30 DIAGNOSIS — E559 Vitamin D deficiency, unspecified: Secondary | ICD-10-CM

## 2022-01-31 ENCOUNTER — Inpatient Hospital Stay (HOSPITAL_BASED_OUTPATIENT_CLINIC_OR_DEPARTMENT_OTHER): Payer: BC Managed Care – PPO | Admitting: Family

## 2022-01-31 ENCOUNTER — Inpatient Hospital Stay: Payer: BC Managed Care – PPO | Attending: Family

## 2022-01-31 ENCOUNTER — Other Ambulatory Visit: Payer: Self-pay

## 2022-01-31 ENCOUNTER — Encounter: Payer: Self-pay | Admitting: Family

## 2022-01-31 VITALS — BP 122/67 | HR 63 | Temp 98.0°F | Resp 18 | Ht 64.0 in | Wt 181.8 lb

## 2022-01-31 DIAGNOSIS — E559 Vitamin D deficiency, unspecified: Secondary | ICD-10-CM

## 2022-01-31 DIAGNOSIS — D0511 Intraductal carcinoma in situ of right breast: Secondary | ICD-10-CM | POA: Insufficient documentation

## 2022-01-31 DIAGNOSIS — D649 Anemia, unspecified: Secondary | ICD-10-CM

## 2022-01-31 DIAGNOSIS — M8580 Other specified disorders of bone density and structure, unspecified site: Secondary | ICD-10-CM | POA: Insufficient documentation

## 2022-01-31 LAB — CBC WITH DIFFERENTIAL (CANCER CENTER ONLY)
Abs Immature Granulocytes: 0.03 10*3/uL (ref 0.00–0.07)
Basophils Absolute: 0.1 10*3/uL (ref 0.0–0.1)
Basophils Relative: 1 %
Eosinophils Absolute: 0.1 10*3/uL (ref 0.0–0.5)
Eosinophils Relative: 2 %
HCT: 37 % (ref 36.0–46.0)
Hemoglobin: 12.4 g/dL (ref 12.0–15.0)
Immature Granulocytes: 1 %
Lymphocytes Relative: 29 %
Lymphs Abs: 1.9 10*3/uL (ref 0.7–4.0)
MCH: 30.1 pg (ref 26.0–34.0)
MCHC: 33.5 g/dL (ref 30.0–36.0)
MCV: 89.8 fL (ref 80.0–100.0)
Monocytes Absolute: 0.5 10*3/uL (ref 0.1–1.0)
Monocytes Relative: 8 %
Neutro Abs: 4 10*3/uL (ref 1.7–7.7)
Neutrophils Relative %: 59 %
Platelet Count: 292 10*3/uL (ref 150–400)
RBC: 4.12 MIL/uL (ref 3.87–5.11)
RDW: 12.3 % (ref 11.5–15.5)
WBC Count: 6.5 10*3/uL (ref 4.0–10.5)
nRBC: 0 % (ref 0.0–0.2)

## 2022-01-31 LAB — CMP (CANCER CENTER ONLY)
ALT: 10 U/L (ref 0–44)
AST: 14 U/L — ABNORMAL LOW (ref 15–41)
Albumin: 4.3 g/dL (ref 3.5–5.0)
Alkaline Phosphatase: 90 U/L (ref 38–126)
Anion gap: 7 (ref 5–15)
BUN: 18 mg/dL (ref 6–20)
CO2: 28 mmol/L (ref 22–32)
Calcium: 9.4 mg/dL (ref 8.9–10.3)
Chloride: 104 mmol/L (ref 98–111)
Creatinine: 0.75 mg/dL (ref 0.44–1.00)
GFR, Estimated: >60 mL/min (ref >60)
Glucose, Bld: 82 mg/dL (ref 70–99)
Potassium: 3.9 mmol/L (ref 3.5–5.1)
Sodium: 139 mmol/L (ref 135–145)
Total Bilirubin: 0.6 mg/dL (ref 0.3–1.2)
Total Protein: 6.5 g/dL (ref 6.5–8.1)

## 2022-01-31 LAB — VITAMIN D 25 HYDROXY (VIT D DEFICIENCY, FRACTURES): Vit D, 25-Hydroxy: 51.11 ng/mL (ref 30–100)

## 2022-01-31 NOTE — Progress Notes (Signed)
Hematology and Oncology Follow Up Visit  Kayla Hood [id number] [birth year] 56 y.o. 01/31/2022   Principle Diagnosis:  DCIS of the right breast, ER/PR + Osteopenia on calcium and vitamin D supplementation  Current Therapy:   Observation   Interim History:  Ms. Kayla Hood is here today for follow-up. She had a successful right tissue expander exchange to breast implant and left breast reduction. She completed an antibiotic for small opening at the middle base of the left breast that had not quite closed. Today, her incision is clean, dry and intact. She is still a little sore in the left breast but this is improving. She is concerned that the right breast is sitting lower on the chest than the left breast. She has a follow-up with her surgeon Dr. Harl Bowie later this month on 02/13/2022.  We were able to get her pelvic US results post CT of the abdomen/pelvis from 09/13/2021 at [facility]. This showed a "mildly enlarged, fibroid uterus which they stated accounted for the questioned adnexal mass that was noted on previous CT".  No fever, chills, n/v, cough, rash, dizziness, SOB, chest pain, palpitations, abdominal pain or changes in bowel or bladder habits.  No blood loss. No bruising or petechiae.  No swelling, tenderness, numbness or tingling in her extremities.  No falls or syncope reported.  She has started exercising regularly again.  She has a healthy appetite and is staying well hydrated. Her weight is stable at 181 lbs.   ECOG Performance Status: 1 - Symptomatic but completely ambulatory  Medications:  Allergies as of 01/31/2022       Reactions   Promethazine Anaphylaxis, Other (See Comments)   Other Reaction: OTHER REACTION Other reaction(s): Unknown   Phenergan [promethazine Hcl] Rash        Medication List        Accurate as of January 31, 2022  3:12 PM. If you have any questions, ask your nurse or doctor.          amLODipine 10 MG tablet Commonly known as: NORVASC Take 1  tablet (10 mg total) by mouth daily.   buPROPion ER 100 MG 12 hr tablet Commonly known as: Wellbutrin SR Take 1 tablet (100 mg total) by mouth daily.   doxycycline 100 MG tablet Commonly known as: VIBRA-TABS Take 1 tablet (100 mg total) by mouth 2 (two) times daily.   ergocalciferol 1.25 MG (50000 UT) capsule Commonly known as: VITAMIN D2 Take 1 capsule (50,000 Units total) by mouth once a week.   escitalopram 10 MG tablet Commonly known as: LEXAPRO escitalopram 10 mg tablet   gabapentin 100 MG capsule Commonly known as: NEURONTIN Take 1-3 tablets as needed up to three times a day.   metoprolol succinate 25 MG 24 hr tablet Commonly known as: TOPROL-XL Take 25-50 mg by mouth daily.   zolpidem 10 MG tablet Commonly known as: AMBIEN TAKE 1 TABLET(10 MG) BY MOUTH AT BEDTIME AS NEEDED FOR SLEEP        Allergies:  Allergies  Allergen Reactions   Promethazine Anaphylaxis and Other (See Comments)    Other Reaction: OTHER REACTION Other reaction(s): Unknown   Phenergan [Promethazine Hcl] Rash    Past Medical History, Surgical history, Social history, and Family History were reviewed and updated.  Review of Systems: All other 10 point review of systems is negative.   Physical Exam:  vitals were not taken for this visit.   Wt Readings from Last 3 Encounters:  01/10/22 181 lb 1.9 oz (82.2 kg)  10/25/21 191 lb (86.6 kg)  10/24/21 189 lb (85.7 kg)    Ocular: Sclerae unicteric, pupils equal, round and reactive to light Ear-nose-throat: Oropharynx clear, dentition fair Lymphatic: No cervical or supraclavicular adenopathy Lungs no rales or rhonchi, good excursion bilaterally Heart regular rate and rhythm, no murmur appreciated Abd soft, nontender, positive bowel sounds MSK no focal spinal tenderness, no joint edema Neuro: non-focal, well-oriented, appropriate affect Breasts: Deferred   Lab Results  Component Value Date   WBC 6.5 01/31/2022   HGB 12.4 01/31/2022    HCT 37.0 01/31/2022   MCV 89.8 01/31/2022   PLT 292 01/31/2022   Lab Results  Component Value Date   FERRITIN 39 10/26/2020   IRON 44 (L) 10/26/2020   TIBC 394 10/26/2020   IRONPCTSAT 11 (L) 10/26/2020   Lab Results  Component Value Date   RBC 4.12 01/31/2022   No results found for: KPAFRELGTCHN, LAMBDASER, KAPLAMBRATIO No results found for: IGGSERUM, IGA, IGMSERUM No results found for: Odetta Pink, SPEI   Chemistry      Component Value Date/Time   NA 140 11/30/2021 1459   K 4.7 11/30/2021 1459   CL 107 11/30/2021 1459   CO2 25 11/30/2021 1459   BUN 23 (H) 11/30/2021 1459   CREATININE 0.74 11/30/2021 1459   CREATININE 0.70 10/26/2020 1528      Component Value Date/Time   CALCIUM 10.3 11/30/2021 1459   ALKPHOS 101 11/30/2021 1459   AST 15 11/30/2021 1459   ALT 13 11/30/2021 1459   BILITOT 0.6 11/30/2021 1459       Impression and Plan: Kayla Hood is a very pleasant 56 yo caucasian female with diagnosis of 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   She has had a right mastectomy and most recently she had her tissue expander removed and replaced with an implant along with a left breast reduction on 12/14/2021. This went well and she continues to recuperate nicely.  She has considered starting Femara but just does not feel that she wants to proceed at this time. She understand that it can help reduce her risk of recurrence. She is doing well right now and really does not want to take anything that would effect her estrogen.  Follow-up in 3 months. We will look at scheduling her for mammogram once her left breast has healed.   Lottie Dawson, NP 2/7/20233:12 PM

## 2022-02-01 ENCOUNTER — Telehealth: Payer: Self-pay

## 2022-02-01 LAB — IRON AND IRON BINDING CAPACITY (CC-WL,HP ONLY)
Iron: 54 ug/dL (ref 28–170)
Saturation Ratios: 14 % (ref 10.4–31.8)
TIBC: 392 ug/dL (ref 250–450)
UIBC: 338 ug/dL (ref 148–442)

## 2022-02-01 LAB — FERRITIN: Ferritin: 40 ng/mL (ref 11–307)

## 2022-02-01 NOTE — Telephone Encounter (Signed)
Advised pt via MyChart.

## 2022-02-01 NOTE — Telephone Encounter (Signed)
-----   Message from Celso Amy, NP sent at 02/01/2022 11:09 AM EST ----- Vitamin D level and iron studies look good!    ----- Message ----- From: Interface, Lab In [facility] Sent: 01/31/2022   2:55 PM EST To: Celso Amy, NP

## 2022-02-14 ENCOUNTER — Other Ambulatory Visit: Payer: Self-pay

## 2022-02-14 ENCOUNTER — Ambulatory Visit (INDEPENDENT_AMBULATORY_CARE_PROVIDER_SITE_OTHER): Payer: BC Managed Care – PPO | Admitting: Physician Assistant

## 2022-02-14 DIAGNOSIS — H6981 Other specified disorders of Eustachian tube, right ear: Secondary | ICD-10-CM | POA: Diagnosis not present

## 2022-02-14 DIAGNOSIS — F5101 Primary insomnia: Secondary | ICD-10-CM | POA: Diagnosis not present

## 2022-02-14 MED ORDER — METHYLPREDNISOLONE 4 MG PO TBPK: ORAL_TABLET | ORAL | 0 refills | Status: DC

## 2022-02-14 MED ORDER — ZOLPIDEM TARTRATE 10 MG PO TABS: ORAL_TABLET | ORAL | 1 refills | Status: DC

## 2022-02-14 NOTE — Patient Instructions (Addendum)
Start with afrin for 2 days then flonase for 3 and repeat  Eustachian Tube Dysfunction Eustachian tube dysfunction refers to a condition in which a blockage develops in the narrow passage that connects the middle ear to the back of the nose (eustachian tube). The eustachian tube regulates air pressure in the middle ear by letting air move between the ear and nose. It also helps to drain fluid from the middle ear space. Eustachian tube dysfunction can affect one or both ears. When the eustachian tube does not function properly, air pressure, fluid, or both can build up in the middle ear. What are the causes? This condition occurs when the eustachian tube becomes blocked or cannot open normally. Common causes of this condition include: Ear infections. Colds and other infections that affect the nose, mouth, and throat (upper respiratory tract). Allergies. Irritation from cigarette smoke. Irritation from stomach acid coming up into the esophagus (gastroesophageal reflux). The esophagus is the part of the body that moves food from the mouth to the stomach. Sudden changes in air pressure, such as from descending in an airplane or scuba diving. Abnormal growths in the nose or throat, such as: Growths that line the nose (nasal polyps). Abnormal growth of cells (tumors). Enlarged tissue at the back of the throat (adenoids). What increases the risk? You are more likely to develop this condition if: You smoke. You are overweight. You are a child who has: Certain birth defects of the mouth, such as cleft palate. Large tonsils or adenoids. What are the signs or symptoms? Common symptoms of this condition include: A feeling of fullness in the ear. Ear pain. Clicking or popping noises in the ear. Ringing in the ear (tinnitus). Hearing loss. Loss of balance. Dizziness. Symptoms may get worse when the air pressure around you changes, such as when you travel to an area of high elevation, fly on an  airplane, or go scuba diving. How is this diagnosed? This condition may be diagnosed based on: Your symptoms. A physical exam of your ears, nose, and throat. Tests, such as those that measure: The movement of your eardrum. Your hearing (audiometry). How is this treated? Treatment depends on the cause and severity of your condition. In mild cases, you may relieve your symptoms by moving air into your ears. This is called "popping the ears." In more severe cases, or if you have symptoms of fluid in your ears, treatment may include: Medicines to relieve congestion (decongestants). Medicines that treat allergies (antihistamines). Nasal sprays or ear drops that contain medicines that reduce swelling (steroids). A procedure to drain the fluid in your eardrum. In this procedure, a small tube may be placed in the eardrum to: Drain the fluid. Restore the air in the middle ear space. A procedure to insert a balloon device through the nose to inflate the opening of the eustachian tube (balloon dilation). Follow these instructions at home: Lifestyle Do not do any of the following until your health care provider approves: Travel to high altitudes. Fly in airplanes. Work in a Pension scheme manager or room. Scuba dive. Do not use any products that contain nicotine or tobacco. These products include cigarettes, chewing tobacco, and vaping devices, such as e-cigarettes. If you need help quitting, ask your health care provider. Keep your ears dry. Wear fitted earplugs during showering and bathing. Dry your ears completely after. General instructions Take over-the-counter and prescription medicines only as told by your health care provider. Use techniques to help pop your ears as recommended by your  health care provider. These may include: Chewing gum. Yawning. Frequent, forceful swallowing. Closing your mouth, holding your nose closed, and gently blowing as if you are trying to blow air out of your  nose. Keep all follow-up visits. This is important. Contact a health care provider if: Your symptoms do not go away after treatment. Your symptoms come back after treatment. You are unable to pop your ears. You have: A fever. Pain in your ear. Pain in your head or neck. Fluid draining from your ear. Your hearing suddenly changes. You become very dizzy. You lose your balance. Get help right away if: You have a sudden, severe increase in any of your symptoms. Summary Eustachian tube dysfunction refers to a condition in which a blockage develops in the eustachian tube. It can be caused by ear infections, allergies, inhaled irritants, or abnormal growths in the nose or throat. Symptoms may include ear pain or fullness, hearing loss, or ringing in the ears. Mild cases are treated with techniques to unblock the ears, such as yawning or chewing gum. More severe cases are treated with medicines or procedures. This information is not intended to replace advice given to you by your health care provider. Make sure you discuss any questions you have with your health care provider. Document Revised: 02/21/2021 Document Reviewed: 02/21/2021 Elsevier Patient Education  2022 [employer].

## 2022-02-14 NOTE — Progress Notes (Signed)
Subjective:    Patient ID: Kayla Hood, female    DOB: Mar 10, 1966, 56 y.o.   MRN: [id number]  HPI Pt is a 56 yo female who presents to the clinic with right ear pain and bilateral ear pressure. Pt denies any URI symptoms, cough, SOB, fever, chills, ST. She has not had any recent travel. She has tried swimmers ear drops and flonase with little relief. She is having some hearing loss.   Needs printed ambien rx to fill when needed.   .. 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   Hidradenitis suppurativa 12/22/2015   Hot flashes due to surgical menopause 12/22/2015   Dyslipidemia (high LDL; low HDL) 12/22/2015   Essential hypertension, benign 12/22/2015   Vaginal dryness, menopausal 12/22/2015   Anxiety 12/22/2015   Screening for diabetes mellitus 12/22/2015   Nodule of finger of both hands 08/18/2016   Abnormal weight gain 11/18/2016   Diverticulitis of large intestine without perforation or abscess 01/23/2017   Ear itching 03/12/2017   Major depression, recurrent, chronic (HCC) 05/09/2017   Lateral epicondylitis, left elbow 07/23/2017   Numbness of left hand 09/03/2017   Family history of early CAD 03/04/2018   Primary insomnia 03/27/2018   Overweight (BMI 25.0-29.9) 03/27/2018   Degenerative disc disease, lumbar 05/16/2018   Pre-diabetes 10/30/2018   Class 1 obesity due to excess calories without serious comorbidity with body mass index (BMI) of 30.0 to 30.9 in adult  11/01/2018   Primary osteoarthritis of left hip 05/08/2019   Gallstones 12/31/2019   Elevated LFTs 12/31/2019   Bruise of breast 03/29/2020   Erythematous papules of skin 03/30/2020   Fullness of breast 03/30/2020   Cervical radiculopathy 12/19/2017   History of left tennis elbow 07/23/2017   Elevated alkaline phosphatase level 01/24/2021   Left-sided chest pain 01/25/2021   PVC (premature ventricular contraction) 01/25/2021   Allergy    Chicken pox    Depression 12/14/2017   Hypertension    Incontinence of urine    History of potentially hazardous body fluid exposure 11/24/2013   Open wound 11/24/2013   OCD (obsessive compulsive disorder) 12/14/2017   Chest tightness or pressure 02/24/2018   Left lateral epicondylitis 04/28/2020   Hand pain 02/07/2021   Left elbow pain 04/28/2020   Shoulder pain 12/19/2017   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   Mixed dyslipidemia 02/08/2021   Obesity (BMI 30-39.9) 02/08/2021   Herpes simplex 03/09/2021   Coccydynia 03/09/2021   Low back pain 03/09/2021   Malignant neoplasm of skin 03/09/2021   Migraine headache 03/09/2021   Need for prophylactic vaccination and inoculation against influenza 03/09/2021   Other dysfunctions of sleep stages or arousal from sleep 03/09/2021   Premenopausal menorrhagia 03/09/2021   Sciatica 03/09/2021   Anxiety disorder due to medical condition 03/09/2021   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   OSA (obstructive sleep apnea) 06/15/2021   S/P mastectomy, right 08/22/2021   Fatigue 08/23/2021   Adjustment reaction  with anxiety and depression 08/23/2021   Osteopenia of multiple sites 06/14/2021   Elevated blood pressure reading 10/28/2021   Mid back pain 10/28/2021   Breast pain, left 01/10/2022   Breast infection in female 01/10/2022   Malignant tumor of breast ([place]) 07/01/2021   ETD (Eustachian tube dysfunction), right 02/15/2022   Resolved Ambulatory Problems    Diagnosis  Date Noted   Viral URI 03/09/2015   No Additional Past Medical History     Review of Systems See HPI.     Objective:   Physical Exam Vitals reviewed.  Constitutional:      Appearance: Normal appearance.  HENT:     Head: Normocephalic.     Right Ear: Ear canal and external ear normal. There is no impacted cerumen.     Left Ear: Tympanic membrane, ear canal and external ear normal. There is no impacted cerumen.     Ears:     Comments: Mild effusion from 6 oclock to 9oclock.    Nose: No congestion or rhinorrhea.     Mouth/Throat:     Mouth: Mucous membranes are moist.     Pharynx: No posterior oropharyngeal erythema.  Eyes:     Extraocular Movements: Extraocular movements intact.     Conjunctiva/sclera: Conjunctivae normal.     Pupils: Pupils are equal, round, and reactive to light.  Cardiovascular:     Rate and Rhythm: Normal rate.  Pulmonary:     Effort: Pulmonary effort is normal.  Musculoskeletal:     Cervical back: Normal range of motion and neck supple.  Lymphadenopathy:     Cervical: No cervical adenopathy.  Neurological:     Mental Status: She is alert.          Assessment & Plan:  .Terre was seen today for ear problem.  Diagnoses and all orders for this visit:  ETD (Eustachian tube dysfunction), right -     methylPREDNISolone (MEDROL DOSEPAK) 4 MG TBPK tablet; Take as directed by package insert.  Primary insomnia -     zolpidem (AMBIEN) 10 MG tablet; TAKE 1 TABLET(10 MG) BY MOUTH AT BEDTIME AS NEEDED FOR SLEEP   No ear infection.  Try afrin 2 days with flonase for 3 days alternation Medrol dose pack if needed HO given for techniques to resolve pressure  Ambien refilled.

## 2022-02-15 ENCOUNTER — Encounter: Payer: Self-pay | Admitting: Physician Assistant

## 2022-02-15 DIAGNOSIS — H6981 Other specified disorders of Eustachian tube, right ear: Secondary | ICD-10-CM

## 2022-02-15 DIAGNOSIS — H6991 Unspecified Eustachian tube disorder, right ear: Secondary | ICD-10-CM

## 2022-02-15 HISTORY — DX: Unspecified eustachian tube disorder, right ear: H69.91

## 2022-02-15 HISTORY — DX: Other specified disorders of eustachian tube, right ear: H69.81

## 2022-02-28 ENCOUNTER — Encounter (HOSPITAL_COMMUNITY): Payer: Self-pay | Admitting: Psychiatry

## 2022-02-28 ENCOUNTER — Ambulatory Visit (INDEPENDENT_AMBULATORY_CARE_PROVIDER_SITE_OTHER): Payer: BC Managed Care – PPO | Admitting: Psychiatry

## 2022-02-28 VITALS — BP 126/76 | Temp 98.6°F | Ht 64.0 in | Wt 178.0 lb

## 2022-02-28 DIAGNOSIS — F339 Major depressive disorder, recurrent, unspecified: Secondary | ICD-10-CM | POA: Diagnosis not present

## 2022-02-28 DIAGNOSIS — F5102 Adjustment insomnia: Secondary | ICD-10-CM | POA: Diagnosis not present

## 2022-02-28 DIAGNOSIS — F411 Generalized anxiety disorder: Secondary | ICD-10-CM

## 2022-02-28 NOTE — Progress Notes (Signed)
[facility] Outpatient Follow up visit  Tele psych visit Patient Identification: Kayla Hood MRN:  [id number] Date of Evaluation:  02/28/2022 Referral Source: pcp Chief Complaint:    depression follow up  Visit Diagnosis:    ICD-10-CM   1. Major depression, recurrent, chronic (HCC)  F33.9     2. GAD (generalized anxiety disorder)  F41.1     3. Adjustment insomnia  F51.02         History of Present Illness:    Has started lexapro back last time, doing better was getting depressed without it Recovering from cancer surgery  Relationship well Anxiety manageable better now  Aggravating factors; [place].  Breast cancer surgery Modifying factors; family,husband Duration 4 plus years Severity improved Past Psychiatric History: depression, anxiety   Previous Psychotropic Medications: Yes  VIIbryd made her agitated.  zoloft didn't help later Substance Abuse History in the last 12 months:  No.  Consequences of Substance Abuse: NA  Past Medical History:  Past Medical History:  Diagnosis Date   Abnormal chest x-ray 03/03/2013   Patient brings in her report of xray done in 10/2012 and 11/2012 which showed persistent abnormality in the left perihilar and infrahilar regions posteriorly. A follow up chest xray was recommended.  Formatting of this note might be different from the original. Formatting of this note might be different from the original. 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  F/u in 2-3 weeks.  Pt offered psych consult but declines.   Anxiety state 06/07/2015   Bruise of breast 03/29/2020   Cervical radiculopathy 12/19/2017   Chest  tightness or pressure 02/24/2018   Chicken pox    Class 1 obesity due to excess calories without serious comorbidity with body mass index (BMI) of 30.0 to 30.9 in adult 11/01/2018   Coccydynia 03/09/2021   no trauma, on exam slightly prominent and recent weight loss probably increased pressure to site0 motrin q 8 x 2 weeks then prn - donut seat and activity modification. F/U prn- she declines rx - no pain   Degenerative disc disease, lumbar 05/16/2018   Formatting of this note might be different from the original. Last Assessment & Plan:  Formatting of this note might be different from the original. Acute mid low back pain. Likely discogenic, started conservatively, prednisone, Robaxin, formal PT. Thoracic and lumbar spine x-rays as the pain is near the thoracolumbar junction. She does have significant pain out of proportion to degree of palpatio   Depression    Depression with anxiety 07/08/2015   PHQ-9 was 3. GAD-7 was 1. 06/2016.   Formatting of this note might be different from the original. Overview:  PHQ-9 was 3. GAD-7 was 1. 06/2016.   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    Ear itching 03/12/2017   Elevated alkaline phosphatase level 01/24/2021   Elevated LFTs 12/31/2019   Endometriosis 07/08/2015   Erythematous  papules of skin 03/30/2020   Essential hypertension, benign 12/22/2015   Exposure to blood or body fluid 11/24/2013   Last Assessment & Plan:  Suspect low risk exposure but post exposure labs pend for reassurance and repeat these in 3-6 months.   Formatting of this note might be different from the original. Formatting of this note might be different from the original. Last Assessment & Plan:  Suspect low risk exposure but post  exposure labs pend for reassurance and repeat these in 3-6 months.   Facial edema 02/04/2014   Family history of early CAD 03/04/2018   Fatigue 08/23/2021   Fibroids 07/08/2015   Fullness of breast 03/30/2020   Gallstones 12/31/2019   General medical exam 03/03/2013   Formatting of this note might be different from the original. Last Assessment & Plan:  Formatting of this note might be different from the original. Normal physical exam except for problems listed below. Labs ordered: cbc w/diff, cmet, lipid, TSH, vit D level (hx of deficiency). Also ordered EKG, chest xray to follow up on previously noted abnormality. Cardiology evaluation.   Hand pain 02/07/2021   Herpes simplex 03/09/2021   Discussed Tx. options. She would like to have valtrex, but would like to get it from a pharmacy outside this [facility].  Written Rx for Valtrex '1000mg'$  2 tabs bid x 1 day given. #4 with 3 RF.  Advised her to take her medication today and then refill the Rx to ha   Hidradenitis suppurativa 12/22/2015   History of left tennis elbow 07/23/2017   Formatting of this note might be different from the original. Last Assessment & Plan:  Failure of conservative measures, ultrasound guided injection as above.   History of potentially hazardous body fluid exposure 11/24/2013   Last Assessment & Plan:  Formatting of this note might be different from the original. Suspect low risk exposure but post exposure labs pend for reassurance and repeat these in 3-6 months. Formatting of this note might be different from the original. Last Assessment & Plan:  Formatting of this note might be different from the original. Suspect low risk exposure but post exposure labs pend for reas   Hives 01/26/2014   Formatting of this note might be different from the original. Last Assessment & Plan:  Formatting of this note might be different from the original. Uncertain etiology. Start Zyrtec nightly. If no improvement in 2 weeks will refer to Allergist.    Hot flashes due to surgical menopause 12/22/2015   Hypertension    Incontinence of urine    Irregular heart beat 03/03/2013   Formatting of this note might be different from the original. Last Assessment & Plan:  Patient feeling skipped beats which are increasing in frequency. Associated with feeling lightheaded, fatigued. Will check metabolic panel, TSH. Patient also reports that her husband says she snores. I am ordering a sleep study to r/o OSA which may cause irregular heart beat. EKG was done and shows a minimal ST    Lateral epicondylitis, left elbow 07/23/2017   Left elbow pain 04/28/2020   Formatting of this note might be different from the original. Added automatically from request for surgery [id number]   Left hip pain 06/28/2013   Formatting of this note might be different from the original. Last Assessment & Plan:  Suspect this is tendinitis involving the iliopsoas tendon. Send for left hip x-ray to rule out any structural abnormalities. Start anti-inflammatories such as ibuprofen 600 mg every 6 hours for the next 3-4  days, ice for 15 minutes 3-4 times a day for the next 3-4 days and rest. Once inflammation subsides recomm   Left lateral epicondylitis 04/28/2020   Formatting of this note might be different from the original. Added automatically from request for surgery [id number]   Left-sided chest pain 01/25/2021   Low back pain 03/09/2021   Major depression, recurrent, chronic ([place]) 05/09/2017   Malignant neoplasm of skin 03/09/2021   Placed consult for removal at [facility]   Migraine headache 03/09/2021   Pt with hx of migraine ha needs refill of meds today   Mixed dyslipidemia 02/08/2021   Need for prophylactic vaccination and inoculation against influenza 03/09/2021   Nodule of finger of both hands 08/18/2016   Numbness of left hand 09/03/2017   Formatting of this note might be different from the original. Last Assessment & Plan:  C8 distribution versus ulnar nerve. She does have  a history of a multilevel cervical fusion. Symptoms have been present for a long time now, it sounds as her neurosurgeon was planning a repeat MRI. I'm going to order a nerve conduction study, repeat x-rays, depending on what we see I may order an MRI with and wi   Obesity (BMI 30-39.9) 02/08/2021   OCD (obsessive compulsive disorder) 12/14/2017   Open wound 11/24/2013   Formatting of this note might be different from the original. Last Assessment & Plan:  Formatting of this note might be different from the original. Keep clean and observe for sx of infection. Rx for Augmentin to begin prn infection. FU for any wound problems. Last Assessment & Plan:  Formatting of this note might be different from the original. Keep clean and observe for sx of infection. Rx for A   OSA (obstructive sleep apnea) 06/15/2021   On CPAP. 05/2021. Managed by neurology.    Osteopenia of multiple sites 06/14/2021   Other dysfunctions of sleep stages or arousal from sleep 03/09/2021   Pt offered Lunest or change to non abuse med.  Pt declines.  Will write for in house meds as noted.  Pt to f/u. PRN   Overweight (BMI 25.0-29.9) 03/27/2018   Palpitations 03/21/2013   Formatting of this note might be different from the original. Last Assessment & Plan:  Pt has seen cardiology in past She was put on BB but it made her too tired She was given norvasc for bp but has not started it yet Probably related to recent increase in stress and anxiety   Pre-diabetes 10/30/2018   Premenopausal menorrhagia 03/09/2021   HOT flashes- was on OCp prior- she dc'd has appt c GYN provider in a few weeks - also recently dc'd her zoloft- should restart zoloft for vasomotor sx will given ambien prn sleep difficulties see below - also pt to keep appt c GYN provider do not suggest altering hormones by me today as she is managed by specialist - discussed calcium also see below- avoid spicy foods and caffeine   Primary insomnia 03/27/2018   Primary  osteoarthritis of left hip 05/08/2019   Formatting of this note might be different from the original. Last Assessment & Plan:  Formatting of this note might be different from the original. Icelynn has 2 pain generators, the hip joint on the greater trochanteric bursa. We injected the left hip joint at the last visit, and her groin pain has completely resolved. She still has some lateral pain referrable to the bursa, hip abductors are weak   PVC (premature ventricular contraction) 01/25/2021  RLQ abdominal pain 01/26/2014   Formatting of this note might be different from the original. Last Assessment & Plan:  RLQ tenderness with palpation and frequent nausea - concerning for appendicitis. Also, her report of symptoms point toward gallbladder. Recommend CT of abdomen/pelvis. Check cbc and cmet. She wanted to wait to have CT done on Wednesday. Instructed to call if symptoms worsen.   S/P mastectomy, right 08/22/2021   Sciatica 03/09/2021   referral to ortho; lumbar x-ray pending   Screening for diabetes mellitus 12/22/2015   Shoulder pain 12/19/2017   Sleep disturbance 03/03/2013   Formatting of this note might be different from the original. Last Assessment & Plan:  Formatting of this note might be different from the original. Re-signed CSC since I could not find the one on file. She has been on Ambien for years and it is helpful 5 mg a night (halves her 10 mg). Will continue this.   Tendonitis of shoulder 09/01/2013   Vaginal dryness, menopausal 12/22/2015    Past Surgical History:  Procedure Laterality Date   ABLATION     CERVICAL FUSION  2012   CHOLECYSTECTOMY     pelvic laproscopic surgeries     4 prior to 2008    Family Psychiatric History: Dad; alcohol use in past. Depression. MOm side has depression and some bipolar in family  Family History:  Family History  Problem Relation Age of Onset   Hyperlipidemia Mother    Ulcerative colitis Mother    Heart disease Father 44       CAD    Hypertension Father    Diverticulitis Father    Hypertension Brother    Cancer Maternal Grandfather 69       Stomach Cancer   Cancer Paternal Grandmother 72       Uterine Cancer   Stroke Paternal Grandfather 68       CVA   Cancer Maternal Grandmother     Social History:   Social History   Socioeconomic History   Marital status: Married    Spouse name: Jermisha Hoffart   Number of children: 1   Years of education: 14   Highest education level: Not on file  Occupational History   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  Tobacco Use   Smoking status: Never   Smokeless tobacco: Never  Vaping Use   Vaping Use: Never used  Substance and Sexual Activity   Alcohol use: No    Alcohol/week: 2.0 standard drinks    Types: 2 Cans of beer per week    Comment: Occasionally    Drug use: No   Sexual activity: Yes    Birth control/protection: None  Other Topics Concern   Not on file  Social History Narrative   Not on file   Social Determinants of Health   Financial Resource Strain: Not on file  Food Insecurity: Not on file  Transportation Needs: Not on file  Physical Activity: Not on file  Stress: Not on file  Social Connections: Not on file     Allergies:   Allergies  Allergen Reactions   Promethazine Anaphylaxis   Phenergan [Promethazine Hcl] Rash    Metabolic Disorder Labs: Lab Results  Component Value Date   HGBA1C 5.9 (H) 08/13/2019   MPG 123 08/13/2019   MPG 120 10/29/2018   No results found for: PROLACTIN Lab Results  Component Value Date   CHOL 246 (H) 08/13/2019   TRIG 377 (H)  08/13/2019   HDL 43 (L) 08/13/2019   CHOLHDL 5.7 (H) 08/13/2019   VLDL 37 (H) 12/22/2015   LDLCALC 145 (H) 08/13/2019   LDLCALC 173 (H) 10/29/2018     Current Medications: Current Outpatient Medications  Medication Sig Dispense Refill   amLODipine (NORVASC) 10 MG tablet Take 1 tablet (10 mg total) by mouth daily. 90 tablet 3    ergocalciferol (VITAMIN D2) 1.25 MG (50000 UT) capsule Take 1 capsule (50,000 Units total) by mouth once a week. 12 capsule 3   escitalopram (LEXAPRO) 10 MG tablet escitalopram 10 mg tablet     methylPREDNISolone (MEDROL DOSEPAK) 4 MG TBPK tablet Take as directed by package insert. 21 tablet 0   zolpidem (AMBIEN) 10 MG tablet TAKE 1 TABLET(10 MG) BY MOUTH AT BEDTIME AS NEEDED FOR SLEEP 90 tablet 1   No current facility-administered medications for this visit.      Psychiatric Specialty Exam: Review of Systems  Cardiovascular:  Negative for chest pain and palpitations.  Neurological:  Negative for tremors.  Psychiatric/Behavioral:  Negative for depression and suicidal ideas.    Blood pressure 126/76, temperature 98.6 F (37 C), height '5\' 4"'$  (1.626 m), weight 178 lb (80.7 kg).Body mass index is 30.55 kg/m.  General Appearance: neat  Eye Contact: good  Speech:  Normal Rate  Volume:  Normal  Mood: Fair  Affect:  fair  Thought Process:  Goal Directed  Orientation:  Full (Time, Place, and Person)  Thought Content:  Rumination  Suicidal Thoughts:  No  Homicidal Thoughts:  No  Memory:  Immediate;   Fair Recent;   Fair  Judgement:  Fair  Insight:  Fair  Psychomotor Activity:  Normal  Concentration:  Concentration: Fair and Attention Span: Fair  Recall:  [employer] of Knowledge:Fair  Language: Fair  Akathisia:  Negative  Handed:  Right  AIMS (if indicated):    Assets:  Desire for Improvement  ADL's:  Intact  Cognition: WNL  Sleep:  Fair with meds    Treatment Plan Summary: Medication management and Plan as follows    Prior documentation reviewed   1. Major depression, recurrent , moderate:improved with lexapro, continue 1omg  2. GAD: better continue lexapro    3. Insomnia: doing fair on ambien, plans to taper down next year  Time spent in office face to face 15 - 20 min including chart review , documentation Fu [id number] Capron MD 3/7/20233:17 PM

## 2022-04-19 ENCOUNTER — Other Ambulatory Visit: Payer: Self-pay

## 2022-04-20 ENCOUNTER — Encounter: Payer: Self-pay | Admitting: Cardiology

## 2022-04-20 ENCOUNTER — Ambulatory Visit (INDEPENDENT_AMBULATORY_CARE_PROVIDER_SITE_OTHER): Payer: BC Managed Care – PPO | Admitting: Cardiology

## 2022-04-20 VITALS — BP 120/76 | HR 64 | Ht 67.0 in | Wt 170.1 lb

## 2022-04-20 DIAGNOSIS — I10 Essential (primary) hypertension: Secondary | ICD-10-CM

## 2022-04-20 DIAGNOSIS — E782 Mixed hyperlipidemia: Secondary | ICD-10-CM

## 2022-04-20 MED ORDER — AMLODIPINE BESYLATE 5 MG PO TABS: 5.0000 mg | ORAL_TABLET | Freq: Every day | ORAL | 3 refills | Status: DC

## 2022-04-20 NOTE — Patient Instructions (Signed)
Medication Instructions:  ?Your physician has recommended you make the following change in your medication:  ? ?Decrease your Amlodipine to 5 mg daily. Cut your current dose in 1/2 and the next refill will be for 5 mg tablet. ? ?*If you need a refill on your cardiac medications before your next appointment, please call your pharmacy* ? ? ?Lab Work: ?None ordered ?If you have labs (blood work) drawn today and your tests are completely normal, you will receive your results only by: ?MyChart Message (if you have MyChart) OR ?A paper copy in the mail ?If you have any lab test that is abnormal or we need to change your treatment, we will call you to review the results. ? ? ?Testing/Procedures: ?None ordered ? ? ?Follow-Up: ?At [facility], you and your health needs are our priority.  As part of our continuing mission to provide you with exceptional heart care, we have created designated Provider Care Teams.  These Care Teams include your primary Cardiologist (physician) and Advanced Practice Providers (APPs -  Physician Assistants and Nurse Practitioners) who all work together to provide you with the care you need, when you need it. ? ?We recommend signing up for the patient portal called "MyChart".  Sign up information is provided on this After Visit Summary.  MyChart is used to connect with patients for Virtual Visits (Telemedicine).  Patients are able to view lab/test results, encounter notes, upcoming appointments, etc.  Non-urgent messages can be sent to your provider as well.   ?To learn more about what you can do with MyChart, go to [email or web address].   ? ?Your next appointment:   ?6 month(s) ? ?The format for your next appointment:   ?In Person ? ?Provider:   ?Jyl Heinz, MD ? ? ?Other Instructions ?NA ? ?

## 2022-04-20 NOTE — Progress Notes (Signed)
?Cardiology Office Note:   ? ?Date:  04/20/2022  ? ?ID:  Kayla Hood, DOB 08-Mar-1966, MRN [id number] ? ?PCP:  Donella Stade, PA-C  ?Cardiologist:  Jenean Lindau, MD  ? ?Referring MD: Donella Stade, PA-C  ? ? ?ASSESSMENT:   ? ?1. Essential hypertension, benign   ?2. Mixed dyslipidemia   ? ?PLAN:   ? ?In order of problems listed above: ? ?Primary prevention stressed with the patient.  Importance of compliance with diet medication stressed and she vocalized understanding.  She is doing very well with diet and exercise and I congratulated her. ?Essential hypertension: Blood pressure stable and diet was emphasized.  She wants to try cutting back amlodipine to  half tablet daily and encouraged her to do so.  She will keep a track of her blood pressures and if it is greater than 130 she will get back to me about increasing her dose.  Salt intake issues were advised ?Mixed dyslipidemia: Lipids are elevated but I think with diet and exercise they will be better.  She plans to get rechecked with primary care in the next few weeks and send me a copy.  We will advise her accordingly. ?Patient will be seen in follow-up appointment in 12 months or earlier if the patient has any concerns ? ? ? ?Medication Adjustments/Labs and Tests Ordered: ?Current medicines are reviewed at length with the patient today.  Concerns regarding medicines are outlined above.  ?Orders Placed This Encounter  ?Procedures  ? EKG 12-Lead  ? ?Meds ordered this encounter  ?Medications  ? amLODipine (NORVASC) 5 MG tablet  ?  Sig: Take 1 tablet (5 mg total) by mouth daily.  ?  Dispense:  90 tablet  ?  Refill:  3  ? ? ? ?No chief complaint on file. ?  ? ?History of Present Illness:   ? ?Kayla Hood is a 56 y.o. female.  Patient has past medical history of essential hypertension and mixed dyslipidemia.  She mentions to me that in the past several months she is exercising well and has lost about 30 pounds.  No chest pain orthopnea or PND.  She is  doing well with diet and exercise.  At the time of my evaluation, the patient is alert awake oriented and in no distress. ? ?Past Medical History:  ?Diagnosis Date  ? Abnormal chest x-ray 03/03/2013  ? Patient brings in her report of xray done in 10/2012 and 11/2012 which showed persistent abnormality in the left perihilar and infrahilar regions posteriorly. A follow up chest xray was recommended.  Formatting of this note might be different from the original. Formatting of this note might be different from the original. Patient brings in her report of xray done in 10/2012 and 11/2012 which showe  ? Abnormal weight gain 11/18/2016  ? 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 Adjustment reaction with anxiety and depression 08/23/2021  ? Allergy   ? 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 Anxiety 12/22/2015  ? Anxiety disorder due to medical condition 03/09/2021  ? Will restart zoloft per pt request.  F/u in 2-3 weeks.  Pt offered psych consult but declines.  ? Anxiety state 06/07/2015  ? Breast infection in female 01/10/2022  ? Breast pain, left 01/10/2022  ? Bruise of breast 03/29/2020  ? Cervical radiculopathy 12/19/2017  ? Chest tightness or pressure 02/24/2018  ? Chicken pox   ? Class 1 obesity due to excess calories without serious comorbidity with body mass index (BMI) of 30.0  to 30.9 in adult 11/01/2018  ? Coccydynia 03/09/2021  ? no trauma, on exam slightly prominent and recent weight loss probably increased pressure to site0 motrin q 8 x 2 weeks then prn - donut seat and activity modification. F/U prn- she declines rx - no pain  ? Degenerative disc disease, lumbar 05/16/2018  ? Formatting of this note might be different from the original. Last Assessment & Plan:  Formatting of this note might be different from the original. Acute mid low back pain. Likely discogenic, started conservatively, prednisone, Robaxin, formal PT. Thoracic and lumbar spine x-rays as the pain is near the thoracolumbar junction. She does  have significant pain out of proportion to degree of palpatio  ? Depression   ? Depression with anxiety 07/08/2015  ? PHQ-9 was 3. GAD-7 was 1. 06/2016.   Formatting of this note might be different from the original. Overview:  PHQ-9 was 3. GAD-7 was 1. 06/2016.   Last Assessment & Plan:  Inc lexapro 20 mg qd  con't ativan  ? Diverticulitis of large intestine without perforation or abscess 01/23/2017  ? 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 Dyslipidemia (high LDL; low HDL) 12/22/2015  ? 10 year cardiovascular risk 5.5 percent 02/2018.   ? Ear itching 03/12/2017  ? Elevated alkaline phosphatase level 01/24/2021  ? Elevated blood pressure reading 10/28/2021  ? Elevated LFTs 12/31/2019  ? Endometriosis 07/08/2015  ? Erythematous papules of skin 03/30/2020  ? Essential hypertension, benign 12/22/2015  ? ETD (Eustachian tube dysfunction), right 02/15/2022  ? Exposure to blood or body fluid 11/24/2013  ? Last Assessment & Plan:  Suspect low risk exposure but post exposure labs pend for reassurance and repeat these in 3-6 months.   Formatting of this note might be different from the original. Formatting of this note might be different from the original. Last Assessment & Plan:  Suspect low risk exposure but post exposure labs pend for reassurance and repeat these in 3-6 months.  ? Facial edema 02/04/2014  ? Family history of early CAD 03/04/2018  ? Fatigue 08/23/2021  ? Fibroids 07/08/2015  ? Fullness of breast 03/30/2020  ? Gallstones 12/31/2019  ? General medical exam 03/03/2013  ? Formatting of this note might be different from the original. Last Assessment & Plan:  Formatting of this note might be different from the original. Normal physical exam except for problems listed below. Labs ordered: cbc w/diff, cmet, lipid, TSH, vit D level (hx of deficiency). Also ordered EKG, chest xray to follow up on previously noted abnormality. Cardiology evaluation.  ? Hand pain 02/07/2021  ? Herpes simplex 03/09/2021   ? Discussed Tx. options. She would like to have valtrex, but would like to get it from a pharmacy outside this [facility].  Written Rx for Valtrex '1000mg'$  2 tabs bid x 1 day given. #4 with 3 RF.  Advised her to take her medication today and then refill the Rx to ha  ? Hidradenitis suppurativa 12/22/2015  ? History of left tennis elbow 07/23/2017  ? Formatting of this note might be different from the original. Last Assessment & Plan:  Failure of conservative measures, ultrasound guided injection as above.  ? History of potentially hazardous body fluid exposure 11/24/2013  ? Last Assessment & Plan:  Formatting of this note might be different from the original. Suspect low risk exposure but post exposure labs pend for reassurance and repeat these in 3-6 months. Formatting of this note might be different from the original.  Last Assessment & Plan:  Formatting of this note might be different from the original. Suspect low risk exposure but post exposure labs pend for reas  ? Hives 01/26/2014  ? Formatting of this note might be different from the original. Last Assessment & Plan:  Formatting of this note might be different from the original. Uncertain etiology. Start Zyrtec nightly. If no improvement in 2 weeks will refer to Allergist.  ? Hot flashes due to surgical menopause 12/22/2015  ? Hypertension   ? Incontinence of urine   ? Irregular heart beat 03/03/2013  ? Formatting of this note might be different from the original. Last Assessment & Plan:  Patient feeling skipped beats which are increasing in frequency. Associated with feeling lightheaded, fatigued. Will check metabolic panel, TSH. Patient also reports that her husband says she snores. I am ordering a sleep study to r/o OSA which may cause irregular heart beat. EKG was done and shows a minimal ST   ? Lateral epicondylitis, left elbow 07/23/2017  ? Left elbow pain 04/28/2020  ? Formatting of this note might be different from the original. Added automatically from  request for surgery [id number]  ? Left hip pain 06/28/2013  ? Formatting of this note might be different from the original. Last Assessment & Plan:  Suspect this is tendinitis involving the iliopsoas tendon. Send

## 2022-05-01 ENCOUNTER — Inpatient Hospital Stay: Payer: BC Managed Care – PPO

## 2022-05-01 ENCOUNTER — Ambulatory Visit: Payer: BC Managed Care – PPO | Admitting: Family

## 2022-05-11 ENCOUNTER — Encounter (HOSPITAL_COMMUNITY): Payer: Self-pay | Admitting: Psychiatry

## 2022-05-11 ENCOUNTER — Ambulatory Visit (INDEPENDENT_AMBULATORY_CARE_PROVIDER_SITE_OTHER): Payer: BC Managed Care – PPO | Admitting: Psychiatry

## 2022-05-11 VITALS — BP 120/80 | Temp 98.6°F | Ht 64.0 in | Wt 167.0 lb

## 2022-05-11 DIAGNOSIS — F5102 Adjustment insomnia: Secondary | ICD-10-CM

## 2022-05-11 DIAGNOSIS — F411 Generalized anxiety disorder: Secondary | ICD-10-CM

## 2022-05-11 DIAGNOSIS — F339 Major depressive disorder, recurrent, unspecified: Secondary | ICD-10-CM

## 2022-05-11 MED ORDER — ESCITALOPRAM OXALATE 10 MG PO TABS: 10.0000 mg | ORAL_TABLET | Freq: Every day | ORAL | 2 refills | Status: DC

## 2022-05-11 NOTE — Progress Notes (Signed)
6 

## 2022-05-11 NOTE — Progress Notes (Signed)
[facility] Outpatient Follow up visit  Tele psych visit Patient Identification: RHYS ANCHONDO MRN:  [id number] Date of Evaluation:  05/11/2022 Referral Source: pcp Chief Complaint:    depression follow up  Visit Diagnosis:  No diagnosis found.  Follow up depression   History of Present Illness:   Lexapro is helping doing fair  Sleep well with ambien  Relationship well Anxiety : manageable  Aggravating factors[place].  Breast cancer surgery Modifying factors; family, husband Duration 4 plus years Severity improved Past Psychiatric History: depression, anxiety   Previous Psychotropic Medications: Yes  VIIbryd made her agitated.  zoloft didn't help later Substance Abuse History in the last 12 months:  No.  Consequences of Substance Abuse: NA  Past Medical History:  Past Medical History:  Diagnosis Date   Abnormal chest x-ray 03/03/2013   Patient brings in her report of xray done in 10/2012 and 11/2012 which showed persistent abnormality in the left perihilar and infrahilar regions posteriorly. A follow up chest xray was recommended.  Formatting of this note might be different from the original. Formatting of this note might be different from the original. 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  F/u in 2-3 weeks.  Pt offered psych consult but declines.   Anxiety state 06/07/2015   Breast infection in female 01/10/2022   Breast pain, left 01/10/2022   Bruise of breast 03/29/2020   Cervical radiculopathy 12/19/2017   Chest tightness or pressure 02/24/2018   Chicken pox    Class 1 obesity due to excess calories without serious comorbidity  with body mass index (BMI) of 30.0 to 30.9 in adult 11/01/2018   Coccydynia 03/09/2021   no trauma, on exam slightly prominent and recent weight loss probably increased pressure to site0 motrin q 8 x 2 weeks then prn - donut seat and activity modification. F/U prn- she declines rx - no pain   Degenerative disc disease, lumbar 05/16/2018   Formatting of this note might be different from the original. Last Assessment & Plan:  Formatting of this note might be different from the original. Acute mid low back pain. Likely discogenic, started conservatively, prednisone, Robaxin, formal PT. Thoracic and lumbar spine x-rays as the pain is near the thoracolumbar junction. She does have significant pain out of proportion to degree of palpatio   Depression    Depression with anxiety 07/08/2015   PHQ-9 was 3. GAD-7 was 1. 06/2016.   Formatting of this note might be different from the original. Overview:  PHQ-9 was 3. GAD-7 was 1. 06/2016.   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    Ear itching 03/12/2017   Elevated alkaline phosphatase level 01/24/2021   Elevated blood pressure reading 10/28/2021   Elevated LFTs 12/31/2019   Endometriosis 07/08/2015   Erythematous papules of skin 03/30/2020   Essential hypertension, benign 12/22/2015   ETD (Eustachian tube dysfunction), right 02/15/2022  Exposure to blood or body fluid 11/24/2013   Last Assessment & Plan:  Suspect low risk exposure but post exposure labs pend for reassurance and repeat these in 3-6 months.   Formatting of this note might be different from the original. Formatting of this note might be different from the original. Last Assessment & Plan:  Suspect low risk exposure but post exposure labs pend for  reassurance and repeat these in 3-6 months.   Facial edema 02/04/2014   Family history of early CAD 03/04/2018   Fatigue 08/23/2021   Fibroids 07/08/2015   Fullness of breast 03/30/2020   Gallstones 12/31/2019   General medical exam 03/03/2013   Formatting of this note might be different from the original. Last Assessment & Plan:  Formatting of this note might be different from the original. Normal physical exam except for problems listed below. Labs ordered: cbc w/diff, cmet, lipid, TSH, vit D level (hx of deficiency). Also ordered EKG, chest xray to follow up on previously noted abnormality. Cardiology evaluation.   Hand pain 02/07/2021   Herpes simplex 03/09/2021   Discussed Tx. options. She would like to have valtrex, but would like to get it from a pharmacy outside this [facility].  Written Rx for Valtrex '1000mg'$  2 tabs bid x 1 day given. #4 with 3 RF.  Advised her to take her medication today and then refill the Rx to ha   Hidradenitis suppurativa 12/22/2015   History of left tennis elbow 07/23/2017   Formatting of this note might be different from the original. Last Assessment & Plan:  Failure of conservative measures, ultrasound guided injection as above.   History of potentially hazardous body fluid exposure 11/24/2013   Last Assessment & Plan:  Formatting of this note might be different from the original. Suspect low risk exposure but post exposure labs pend for reassurance and repeat these in 3-6 months. Formatting of this note might be different from the original. Last Assessment & Plan:  Formatting of this note might be different from the original. Suspect low risk exposure but post exposure labs pend for reas   Hives 01/26/2014   Formatting of this note might be different from the original. Last Assessment & Plan:  Formatting of this note might be different from the original. Uncertain etiology. Start Zyrtec nightly. If no improvement in 2 weeks will refer to Allergist.   Hot flashes due to  surgical menopause 12/22/2015   Hypertension    Incontinence of urine    Irregular heart beat 03/03/2013   Formatting of this note might be different from the original. Last Assessment & Plan:  Patient feeling skipped beats which are increasing in frequency. Associated with feeling lightheaded, fatigued. Will check metabolic panel, TSH. Patient also reports that her husband says she snores. I am ordering a sleep study to r/o OSA which may cause irregular heart beat. EKG was done and shows a minimal ST    Lateral epicondylitis, left elbow 07/23/2017   Left elbow pain 04/28/2020   Formatting of this note might be different from the original. Added automatically from request for surgery [id number]   Left hip pain 06/28/2013   Formatting of this note might be different from the original. Last Assessment & Plan:  Suspect this is tendinitis involving the iliopsoas tendon. Send for left hip x-ray to rule out any structural abnormalities. Start anti-inflammatories such as ibuprofen 600 mg every 6 hours for the next 3-4 days, ice for 15 minutes 3-4 times a day for the next  3-4 days and rest. Once inflammation subsides recomm   Left lateral epicondylitis 04/28/2020   Formatting of this note might be different from the original. Added automatically from request for surgery [id number]   Left-sided chest pain 01/25/2021   Low back pain 03/09/2021   Major depression, recurrent, chronic ([place]) 05/09/2017   Malignant neoplasm of skin 03/09/2021   Placed consult for removal at [facility]   Malignant tumor of breast ([place]) 07/01/2021   Mid back pain 10/28/2021   Migraine headache 03/09/2021   Pt with hx of migraine ha needs refill of meds today   Mixed dyslipidemia 02/08/2021   Need for prophylactic vaccination and inoculation against influenza 03/09/2021   Nodule of finger of both hands 08/18/2016   Numbness of left hand 09/03/2017   Formatting of this note might be different from the original. Last Assessment & Plan:   C8 distribution versus ulnar nerve. She does have a history of a multilevel cervical fusion. Symptoms have been present for a long time now, it sounds as her neurosurgeon was planning a repeat MRI. I'm going to order a nerve conduction study, repeat x-rays, depending on what we see I may order an MRI with and wi   Obesity (BMI 30-39.9) 02/08/2021   OCD (obsessive compulsive disorder) 12/14/2017   Open wound 11/24/2013   Formatting of this note might be different from the original. Last Assessment & Plan:  Formatting of this note might be different from the original. Keep clean and observe for sx of infection. Rx for Augmentin to begin prn infection. FU for any wound problems. Last Assessment & Plan:  Formatting of this note might be different from the original. Keep clean and observe for sx of infection. Rx for A   OSA (obstructive sleep apnea) 06/15/2021   On CPAP. 05/2021. Managed by neurology.    Osteopenia of multiple sites 06/14/2021   Other dysfunctions of sleep stages or arousal from sleep 03/09/2021   Pt offered Lunest or change to non abuse med.  Pt declines.  Will write for in house meds as noted.  Pt to f/u. PRN   Overweight (BMI 25.0-29.9) 03/27/2018   Palpitations 03/21/2013   Formatting of this note might be different from the original. Last Assessment & Plan:  Pt has seen cardiology in past She was put on BB but it made her too tired She was given norvasc for bp but has not started it yet Probably related to recent increase in stress and anxiety   Pre-diabetes 10/30/2018   Premenopausal menorrhagia 03/09/2021   HOT flashes- was on OCp prior- she dc'd has appt c GYN provider in a few weeks - also recently dc'd her zoloft- should restart zoloft for vasomotor sx will given ambien prn sleep difficulties see below - also pt to keep appt c GYN provider do not suggest altering hormones by me today as she is managed by specialist - discussed calcium also see below- avoid spicy foods and caffeine    Primary insomnia 03/27/2018   Primary osteoarthritis of left hip 05/08/2019   Formatting of this note might be different from the original. Last Assessment & Plan:  Formatting of this note might be different from the original. Akesha has 2 pain generators, the hip joint on the greater trochanteric bursa. We injected the left hip joint at the last visit, and her groin pain has completely resolved. She still has some lateral pain referrable to the bursa, hip abductors are weak   PVC (premature ventricular contraction)  01/25/2021   RLQ abdominal pain 01/26/2014   Formatting of this note might be different from the original. Last Assessment & Plan:  RLQ tenderness with palpation and frequent nausea - concerning for appendicitis. Also, her report of symptoms point toward gallbladder. Recommend CT of abdomen/pelvis. Check cbc and cmet. She wanted to wait to have CT done on Wednesday. Instructed to call if symptoms worsen.   S/P mastectomy, right 08/22/2021   Sciatica 03/09/2021   referral to ortho; lumbar x-ray pending   Screening for diabetes mellitus 12/22/2015   Shoulder pain 12/19/2017   Sleep disturbance 03/03/2013   Formatting of this note might be different from the original. Last Assessment & Plan:  Formatting of this note might be different from the original. Re-signed [facility] since I could not find the one on file. She has been on Ambien for years and it is helpful 5 mg a night (halves her 10 mg). Will continue this.   Tendonitis of shoulder 09/01/2013   Vaginal dryness, menopausal 12/22/2015    Past Surgical History:  Procedure Laterality Date   ABLATION     CERVICAL FUSION  2012   CHOLECYSTECTOMY     pelvic laproscopic surgeries     4 prior to 2008    Family Psychiatric History: Dad; alcohol use in past. Depression. MOm side has depression and some bipolar in family  Family History:  Family History  Problem Relation Age of Onset   Hyperlipidemia Mother    Ulcerative colitis Mother     Heart disease Father 40       CAD   Hypertension Father    Diverticulitis Father    Hypertension Brother    Cancer Maternal Grandfather 85       Stomach Cancer   Cancer Paternal Grandmother 35       Uterine Cancer   Stroke Paternal Grandfather 18       CVA   Cancer Maternal Grandmother     Social History:   Social History   Socioeconomic History   Marital status: Married    Spouse name: Gayathri Futrell   Number of children: 1   Years of education: 14   Highest education level: Not on file  Occupational History   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  Tobacco Use   Smoking status: Never   Smokeless tobacco: Never  Vaping Use   Vaping Use: Never used  Substance and Sexual Activity   Alcohol use: No    Alcohol/week: 2.0 standard drinks    Types: 2 Cans of beer per week    Comment: Occasionally    Drug use: No   Sexual activity: Yes    Birth control/protection: None  Other Topics Concern   Not on file  Social History Narrative   Not on file   Social Determinants of Health   Financial Resource Strain: Not on file  Food Insecurity: Not on file  Transportation Needs: Not on file  Physical Activity: Not on file  Stress: Not on file  Social Connections: Not on file     Allergies:   Allergies  Allergen Reactions   Promethazine Anaphylaxis   Phenergan [Promethazine Hcl] Rash    Metabolic Disorder Labs: Lab Results  Component Value Date   HGBA1C 5.9 (H) 08/13/2019   MPG 123 08/13/2019   MPG 120 10/29/2018   No results found for: PROLACTIN Lab Results  Component Value Date   CHOL 246 (H) 08/13/2019  TRIG 377 (H) 08/13/2019   HDL 43 (L) 08/13/2019   CHOLHDL 5.7 (H) 08/13/2019   VLDL 37 (H) 12/22/2015   LDLCALC 145 (H) 08/13/2019   LDLCALC 173 (H) 10/29/2018     Current Medications: Current Outpatient Medications  Medication Sig Dispense Refill   amLODipine (NORVASC) 5 MG tablet Take 1 tablet (5  mg total) by mouth daily. 90 tablet 3   zolpidem (AMBIEN) 10 MG tablet TAKE 1 TABLET(10 MG) BY MOUTH AT BEDTIME AS NEEDED FOR SLEEP 90 tablet 1   escitalopram (LEXAPRO) 10 MG tablet Take 1 tablet (10 mg total) by mouth daily. 30 tablet 2   No current facility-administered medications for this visit.      Psychiatric Specialty Exam: Review of Systems  Cardiovascular:  Negative for chest pain and palpitations.  Neurological:  Negative for tremors.  Psychiatric/Behavioral:  Negative for depression and suicidal ideas.    Blood pressure 120/80, temperature 98.6 F (37 C), height '5\' 4"'$  (1.626 m), weight 167 lb (75.8 kg).Body mass index is 28.67 kg/m.  General Appearance: neat  Eye Contact: good  Speech:  Normal Rate  Volume:  Normal  Mood: Fair  Affect:  fair  Thought Process:  Goal Directed  Orientation:  Full (Time, Place, and Person)  Thought Content:  Rumination  Suicidal Thoughts:  No  Homicidal Thoughts:  No  Memory:  Immediate;   Fair Recent;   Fair  Judgement:  Fair  Insight:  Fair  Psychomotor Activity:  Normal  Concentration:  Concentration: Fair and Attention Span: Fair  Recall:  [employer] of Knowledge:Fair  Language: Fair  Akathisia:  Negative  Handed:  Right  AIMS (if indicated):    Assets:  Desire for Improvement  ADL's:  Intact  Cognition: WNL  Sleep:  Fair with meds    Treatment Plan Summary: Medication management and Plan as follows    Prior documentation reviewed   1. Major depression, recurrent , doing fair, continue lexapro   2. GAD: better continue lexapro    3. Insomnia: manageable on meds  Time spent in office face to face 15 - 20 min including chart review , documentation Fu  54mNMerian Capron MD 5/18/20232:42 PM

## 2022-06-02 ENCOUNTER — Inpatient Hospital Stay: Payer: BC Managed Care – PPO | Attending: Family

## 2022-06-02 ENCOUNTER — Other Ambulatory Visit: Payer: Self-pay | Admitting: Lab

## 2022-06-02 ENCOUNTER — Inpatient Hospital Stay (HOSPITAL_BASED_OUTPATIENT_CLINIC_OR_DEPARTMENT_OTHER): Payer: BC Managed Care – PPO | Admitting: Family

## 2022-06-02 ENCOUNTER — Other Ambulatory Visit: Payer: Self-pay

## 2022-06-02 ENCOUNTER — Encounter: Payer: Self-pay | Admitting: Family

## 2022-06-02 VITALS — HR 68 | Temp 98.2°F | Resp 18 | Ht 68.0 in | Wt 165.0 lb

## 2022-06-02 DIAGNOSIS — D0511 Intraductal carcinoma in situ of right breast: Secondary | ICD-10-CM

## 2022-06-02 DIAGNOSIS — E559 Vitamin D deficiency, unspecified: Secondary | ICD-10-CM

## 2022-06-02 DIAGNOSIS — D649 Anemia, unspecified: Secondary | ICD-10-CM

## 2022-06-02 DIAGNOSIS — M8580 Other specified disorders of bone density and structure, unspecified site: Secondary | ICD-10-CM

## 2022-06-02 DIAGNOSIS — N644 Mastodynia: Secondary | ICD-10-CM

## 2022-06-02 DIAGNOSIS — M79622 Pain in left upper arm: Secondary | ICD-10-CM | POA: Diagnosis not present

## 2022-06-02 LAB — VITAMIN D 25 HYDROXY (VIT D DEFICIENCY, FRACTURES): Vit D, 25-Hydroxy: 56.45 ng/mL (ref 30–100)

## 2022-06-02 LAB — CMP (CANCER CENTER ONLY)
ALT: 11 U/L (ref 0–44)
AST: 15 U/L (ref 15–41)
Albumin: 4.7 g/dL (ref 3.5–5.0)
Alkaline Phosphatase: 107 U/L (ref 38–126)
Anion gap: 6 (ref 5–15)
BUN: 13 mg/dL (ref 6–20)
CO2: 28 mmol/L (ref 22–32)
Calcium: 10.3 mg/dL (ref 8.9–10.3)
Chloride: 105 mmol/L (ref 98–111)
Creatinine: 0.76 mg/dL (ref 0.44–1.00)
GFR, Estimated: >60 mL/min (ref >60)
Glucose, Bld: 100 mg/dL — ABNORMAL HIGH (ref 70–99)
Potassium: 4.5 mmol/L (ref 3.5–5.1)
Sodium: 139 mmol/L (ref 135–145)
Total Bilirubin: 0.9 mg/dL (ref 0.3–1.2)
Total Protein: 7.3 g/dL (ref 6.5–8.1)

## 2022-06-02 LAB — CBC WITH DIFFERENTIAL (CANCER CENTER ONLY)
Abs Immature Granulocytes: 0.01 10*3/uL (ref 0.00–0.07)
Basophils Absolute: 0.1 10*3/uL (ref 0.0–0.1)
Basophils Relative: 1 %
Eosinophils Absolute: 0.2 10*3/uL (ref 0.0–0.5)
Eosinophils Relative: 4 %
HCT: 43.6 % (ref 36.0–46.0)
Hemoglobin: 14.5 g/dL (ref 12.0–15.0)
Immature Granulocytes: 0 %
Lymphocytes Relative: 28 %
Lymphs Abs: 1.3 10*3/uL (ref 0.7–4.0)
MCH: 29.6 pg (ref 26.0–34.0)
MCHC: 33.3 g/dL (ref 30.0–36.0)
MCV: 89 fL (ref 80.0–100.0)
Monocytes Absolute: 0.5 10*3/uL (ref 0.1–1.0)
Monocytes Relative: 10 %
Neutro Abs: 2.7 10*3/uL (ref 1.7–7.7)
Neutrophils Relative %: 57 %
Platelet Count: 299 10*3/uL (ref 150–400)
RBC: 4.9 MIL/uL (ref 3.87–5.11)
RDW: 12 % (ref 11.5–15.5)
WBC Count: 4.7 10*3/uL (ref 4.0–10.5)
nRBC: 0 % (ref 0.0–0.2)

## 2022-06-02 LAB — IRON AND IRON BINDING CAPACITY (CC-WL,HP ONLY)
Iron: 76 ug/dL (ref 28–170)
Saturation Ratios: 17 % (ref 10.4–31.8)
TIBC: 440 ug/dL (ref 250–450)
UIBC: 364 ug/dL (ref 148–442)

## 2022-06-02 LAB — FERRITIN: Ferritin: 26 ng/mL (ref 11–307)

## 2022-06-02 NOTE — Progress Notes (Signed)
Hematology and Oncology Follow Up Visit  KEIDRA WITHERS [id number] Sep 01, 1966 56 y.o. 06/02/2022   Principle Diagnosis:  DCIS of the right breast, ER/PR + Osteopenia on calcium and vitamin D supplementation   Current Therapy:        Observation              Interim History:  Ms. Rozar is here today for follow-up. She is doing well but has noted some drainage from the 6 o'clock position of the left breast, clear fluid. There are two dry spots at this time. No drainage noted right now. She does have firmness and tenderness at the base of the breast from the 4 o'clock position to the 6 o'clock position as well as tenderness in the left axilla.  No adenopathy noted.  No fever, chills, n/v, cough, rash, dizziness, SOB, chest pain, palpitations, abdominal pain or changes in bowel or bladder habits.  She states that she has started jogging for exercise recently and thinks she may have a pinched nerve in her back. She has intermittent pain, tingling and numbness in the left hip and thigh.  No falls or syncope reported. Appetite and hydration are good. Her weight is stable at 165 lbs.   ECOG Performance Status: 0 - Asymptomatic  Medications:  Allergies as of 06/02/2022       Reactions   Promethazine Anaphylaxis   Phenergan [promethazine Hcl] Rash        Medication List        Accurate as of June 02, 2022 10:04 AM. If you have any questions, ask your nurse or doctor.          amLODipine 5 MG tablet Commonly known as: NORVASC Take 1 tablet (5 mg total) by mouth daily.   escitalopram 10 MG tablet Commonly known as: LEXAPRO Take 1 tablet (10 mg total) by mouth daily.   zolpidem 10 MG tablet Commonly known as: AMBIEN TAKE 1 TABLET(10 MG) BY MOUTH AT BEDTIME AS NEEDED FOR SLEEP        Allergies:  Allergies  Allergen Reactions   Promethazine Anaphylaxis   Phenergan [Promethazine Hcl] Rash    Past Medical History, Surgical history, Social history, and Family History  were reviewed and updated.  Review of Systems: All other 10 point review of systems is negative.   Physical Exam:  height is '5\' 8"'$  (1.727 m) and weight is 165 lb 0.6 oz (74.9 kg). Her oral temperature is 98.2 F (36.8 C). Her pulse is 68. Her respiration is 18 and oxygen saturation is 100%.   Wt Readings from Last 3 Encounters:  06/02/22 165 lb 0.6 oz (74.9 kg)  04/20/22 170 lb 1.3 oz (77.1 kg)  01/31/22 181 lb 12.8 oz (82.5 kg)    Ocular: Sclerae unicteric, pupils equal, round and reactive to light Ear-nose-throat: Oropharynx clear, dentition fair Lymphatic: No cervical, axillary or supraclavicular adenopathy Lungs no rales or rhonchi, good excursion bilaterally Heart regular rate and rhythm, no murmur appreciated Abd soft, nontender, positive bowel sounds MSK no focal spinal tenderness, no joint edema Neuro: non-focal, well-oriented, appropriate affect Breasts: Same as above   Lab Results  Component Value Date   WBC 4.7 06/02/2022   HGB 14.5 06/02/2022   HCT 43.6 06/02/2022   MCV 89.0 06/02/2022   PLT 299 06/02/2022   Lab Results  Component Value Date   FERRITIN 40 01/31/2022   IRON 54 01/31/2022   TIBC 392 01/31/2022   UIBC 338 01/31/2022   IRONPCTSAT 14 01/31/2022  Lab Results  Component Value Date   RBC 4.90 06/02/2022   No results found for: "KPAFRELGTCHN", "LAMBDASER", "KAPLAMBRATIO" No results found for: "IGGSERUM", "IGA", "IGMSERUM" No results found for: "TOTALPROTELP", "ALBUMINELP", "A1GS", "A2GS", "BETS", "BETA2SER", "GAMS", "MSPIKE", "SPEI"   Chemistry      Component Value Date/Time   NA 139 01/31/2022 1441   K 3.9 01/31/2022 1441   CL 104 01/31/2022 1441   CO2 28 01/31/2022 1441   BUN 18 01/31/2022 1441   CREATININE 0.75 01/31/2022 1441   CREATININE 0.70 10/26/2020 1528      Component Value Date/Time   CALCIUM 9.4 01/31/2022 1441   ALKPHOS 90 01/31/2022 1441   AST 14 (L) 01/31/2022 1441   ALT 10 01/31/2022 1441   BILITOT 0.6 01/31/2022  1441       Impression and Plan: Ms. Jaggers is a very pleasant 56 yo caucasian female with diagnosis of 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   She has had a right mastectomy and dad her tissue expander removed and replaced with an implant along with a left breast reduction on 12/14/2021. She has considered starting Femara but just does not feel that she wants to proceed at this time.  We will get a mammogram of the left breast and US of the left axilla to assess for cause of pain and swelling.  Her follow-up with surgery isn't until August.  Follow-up in 3 months.   Lottie Dawson, NP 6/9/202310:04 AM

## 2022-06-06 ENCOUNTER — Other Ambulatory Visit (HOSPITAL_BASED_OUTPATIENT_CLINIC_OR_DEPARTMENT_OTHER): Payer: Self-pay | Admitting: Physician Assistant

## 2022-06-08 ENCOUNTER — Telehealth: Payer: Self-pay | Admitting: *Deleted

## 2022-06-08 NOTE — Telephone Encounter (Signed)
Per 06/02/22 los - called and gave upcoming appointments - confirmed

## 2022-06-09 ENCOUNTER — Other Ambulatory Visit: Payer: Self-pay | Admitting: Family

## 2022-06-09 DIAGNOSIS — D0511 Intraductal carcinoma in situ of right breast: Secondary | ICD-10-CM

## 2022-06-09 DIAGNOSIS — N644 Mastodynia: Secondary | ICD-10-CM

## 2022-06-09 DIAGNOSIS — N63 Unspecified lump in unspecified breast: Secondary | ICD-10-CM

## 2022-06-09 LAB — HM COLONOSCOPY

## 2022-06-12 ENCOUNTER — Ambulatory Visit: Payer: BC Managed Care – PPO | Admitting: Physician Assistant

## 2022-06-14 ENCOUNTER — Ambulatory Visit
Admission: RE | Admit: 2022-06-14 | Discharge: 2022-06-14 | Disposition: A | Discharge status: Home / Self Care | Payer: BC Managed Care – PPO | Source: Ambulatory Visit | Attending: Family | Admitting: Family

## 2022-06-14 ENCOUNTER — Ambulatory Visit: Payer: BC Managed Care – PPO

## 2022-06-14 DIAGNOSIS — N644 Mastodynia: Secondary | ICD-10-CM

## 2022-06-14 DIAGNOSIS — D0511 Intraductal carcinoma in situ of right breast: Secondary | ICD-10-CM

## 2022-06-14 DIAGNOSIS — M79622 Pain in left upper arm: Secondary | ICD-10-CM

## 2022-06-30 ENCOUNTER — Ambulatory Visit (INDEPENDENT_AMBULATORY_CARE_PROVIDER_SITE_OTHER): Payer: BC Managed Care – PPO | Admitting: Sports Medicine

## 2022-06-30 DIAGNOSIS — M7062 Trochanteric bursitis, left hip: Secondary | ICD-10-CM

## 2022-06-30 MED ORDER — MELOXICAM 15 MG PO TABS: ORAL_TABLET | ORAL | 3 refills | Status: DC

## 2022-06-30 NOTE — Assessment & Plan Note (Addendum)
Pleasant 56 year old female, we have treated her for hip pain in the past, pain was intra-articular and responded to an injection in 2020. She has done well until recently, about 3 months ago has started jogging, tries to get about an hour on the treadmill but unfortunately developed pain left lateral hip localized over the greater trochanter that she feels about 20 minutes into the jog. On exam she does have significantly weak hip abductors on the left, and exquisite concordant pain with palpation of the greater trochanter. Discussed the anatomy and pathophysiology, we will do meloxicam, and work on hip abductor strengthening, return to see me in 6 weeks, injection if not better. She will also do 15 minutes on the treadmill and switch to a stationary bike before getting the hip pain.

## 2022-06-30 NOTE — Patient Instructions (Signed)
Hip Abductor Rehabilitation Protocol:  1.  Side leg raises.  3x30 with no weight, then 3x15 with 2 lb ankle weight, then 3x15 with 5 lb ankle weight 2.  Standing hip rotation.  3x30 with no weight, then 3x15 with 2 lb ankle weight, then 3x15 with 5 lb ankle weight. 3.  Side step ups.  3x30 with no weight, then 3x15 with 5 lbs in backpack, then 3x15 with 10 lbs in backpack.

## 2022-06-30 NOTE — Progress Notes (Signed)
    Procedures performed today:    None.  Independent interpretation of notes and tests performed by another provider:   None.  Brief History, Exam, Impression, and Recommendations:    Trochanteric bursitis of left hip Pleasant 56 year old female, we have treated her for hip pain in the past, pain was intra-articular and responded to an injection in 2020. She has done well until recently, about 3 months ago has started jogging, tries to get about an hour on the treadmill but unfortunately developed pain left lateral hip localized over the greater trochanter that she feels about 20 minutes into the jog. On exam she does have significantly weak hip abductors on the left, and exquisite concordant pain with palpation of the greater trochanter. Discussed the anatomy and pathophysiology, we will do meloxicam, and work on hip abductor strengthening, return to see me in 6 weeks, injection if not better. She will also do 15 minutes on the treadmill and switch to a stationary bike before getting the hip pain.  Chronic process with exacerbation and pharmacologic intervention  ____________________________________________ Gwen Her. [name], M.D., ABFM., CAQSM., AME. Primary Care and Sports Medicine [facility]  Adjunct Professor of [employer] [facility] [employer]  Risk manager

## 2022-08-18 ENCOUNTER — Ambulatory Visit: Payer: BC Managed Care – PPO | Admitting: Sports Medicine

## 2022-09-01 ENCOUNTER — Ambulatory Visit: Payer: BC Managed Care – PPO | Admitting: Hematology & Oncology

## 2022-09-01 ENCOUNTER — Other Ambulatory Visit: Payer: BC Managed Care – PPO

## 2022-09-13 ENCOUNTER — Ambulatory Visit
Admission: RE | Admit: 2022-09-13 | Discharge: 2022-09-13 | Disposition: A | Discharge status: Home / Self Care | Payer: BC Managed Care – PPO | Source: Ambulatory Visit | Attending: Family Medicine | Admitting: Family Medicine

## 2022-09-13 VITALS — BP 168/91 | HR 71 | Temp 97.9°F | Resp 18

## 2022-09-13 DIAGNOSIS — R300 Dysuria: Secondary | ICD-10-CM

## 2022-09-13 DIAGNOSIS — N3090 Cystitis, unspecified without hematuria: Secondary | ICD-10-CM

## 2022-09-13 LAB — POCT URINALYSIS DIP (MANUAL ENTRY)
Bilirubin, UA: NEGATIVE
Glucose, UA: NEGATIVE mg/dL
Ketones, POC UA: NEGATIVE mg/dL
Nitrite, UA: POSITIVE — AB
Protein Ur, POC: NEGATIVE mg/dL
Spec Grav, UA: <=1.005 — AB (ref 1.010–1.025)
Urobilinogen, UA: 0.2 E.U./dL
pH, UA: 5.5 (ref 5.0–8.0)

## 2022-09-13 MED ORDER — NITROFURANTOIN MONOHYD MACRO 100 MG PO CAPS: 100.0000 mg | ORAL_CAPSULE | Freq: Two times a day (BID) | ORAL | 0 refills | Status: DC

## 2022-09-13 MED ORDER — PHENAZOPYRIDINE HCL 200 MG PO TABS: 200.0000 mg | ORAL_TABLET | Freq: Three times a day (TID) | ORAL | 0 refills | Status: DC

## 2022-09-13 NOTE — ED Triage Notes (Signed)
Pt c/o dysuria that started this morning. Tylenol and ibuprofen prn.

## 2022-09-13 NOTE — Discharge Instructions (Signed)
Continue to drink lots of water Take the antibiotic 2 times a day Take Pyridium as needed for urinary pain  Your urine culture will be available in 2 to 3 days.  You will be called if a change in antibiotic is necessary

## 2022-09-13 NOTE — ED Provider Notes (Signed)
[name]    CSN: [id number] Arrival date & time: 09/13/22  1435      History   Chief Complaint Chief Complaint  Patient presents with   Dysuria    HPI CEIL RODERICK is a 56 y.o. female.   HPI  Patient states symptoms started today  Dysuria and frequency and scant hematuria noted No fever or chills No flank or back pain No nausea or vomiting  Past Medical History:  Diagnosis Date   Abnormal chest x-ray 03/03/2013   Patient brings in her report of xray done in 10/2012 and 11/2012 which showed persistent abnormality in the left perihilar and infrahilar regions posteriorly. A follow up chest xray was recommended.  Formatting of this note might be different from the original. Formatting of this note might be different from the original. 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  F/u in 2-3 weeks.  Pt offered psych consult but declines.   Anxiety state 06/07/2015   Breast infection in female 01/10/2022   Breast pain, left 01/10/2022   Bruise of breast 03/29/2020   Cervical radiculopathy 12/19/2017   Chest tightness or pressure 02/24/2018   Chicken pox    Class 1 obesity due to excess calories without serious comorbidity with body mass index (BMI) of 30.0 to 30.9 in adult 11/01/2018   Coccydynia 03/09/2021   no trauma, on exam slightly prominent and recent weight loss probably increased pressure to site0 motrin q 8 x 2 weeks then prn - donut seat and activity modification. F/U prn- she declines rx - no pain   Degenerative disc disease, lumbar 05/16/2018   Formatting of this note might be different from the original. Last Assessment &  Plan:  Formatting of this note might be different from the original. Acute mid low back pain. Likely discogenic, started conservatively, prednisone, Robaxin, formal PT. Thoracic and lumbar spine x-rays as the pain is near the thoracolumbar junction. She does have significant pain out of proportion to degree of palpatio   Depression    Depression with anxiety 07/08/2015   PHQ-9 was 3. GAD-7 was 1. 06/2016.   Formatting of this note might be different from the original. Overview:  PHQ-9 was 3. GAD-7 was 1. 06/2016.   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    Ear itching 03/12/2017   Elevated alkaline phosphatase level 01/24/2021   Elevated blood pressure reading 10/28/2021   Elevated LFTs 12/31/2019   Endometriosis 07/08/2015   Erythematous papules of skin 03/30/2020   Essential hypertension, benign 12/22/2015   ETD (Eustachian tube dysfunction), right 02/15/2022   Exposure to blood or body fluid 11/24/2013   Last Assessment & Plan:  Suspect low risk exposure but post exposure labs pend for reassurance and repeat these in 3-6 months.   Formatting of this note might be different from the original. Formatting of this note might be different from the  original. Last Assessment & Plan:  Suspect low risk exposure but post exposure labs pend for reassurance and repeat these in 3-6 months.   Facial edema 02/04/2014   Family history of early CAD 03/04/2018   Fatigue 08/23/2021   Fibroids 07/08/2015   Fullness of breast 03/30/2020   Gallstones 12/31/2019   General medical exam 03/03/2013   Formatting of this note might be different from the original. Last Assessment & Plan:  Formatting of this note might be different from the original. Normal physical exam  except for problems listed below. Labs ordered: cbc w/diff, cmet, lipid, TSH, vit D level (hx of deficiency). Also ordered EKG, chest xray to follow up on previously noted abnormality. Cardiology evaluation.   Hand pain 02/07/2021   Herpes simplex 03/09/2021   Discussed Tx. options. She would like to have valtrex, but would like to get it from a pharmacy outside this [facility].  Written Rx for Valtrex '1000mg'$  2 tabs bid x 1 day given. #4 with 3 RF.  Advised her to take her medication today and then refill the Rx to ha   Hidradenitis suppurativa 12/22/2015   History of left tennis elbow 07/23/2017   Formatting of this note might be different from the original. Last Assessment & Plan:  Failure of conservative measures, ultrasound guided injection as above.   History of potentially hazardous body fluid exposure 11/24/2013   Last Assessment & Plan:  Formatting of this note might be different from the original. Suspect low risk exposure but post exposure labs pend for reassurance and repeat these in 3-6 months. Formatting of this note might be different from the original. Last Assessment & Plan:  Formatting of this note might be different from the original. Suspect low risk exposure but post exposure labs pend for reas   Hives 01/26/2014   Formatting of this note might be different from the original. Last Assessment & Plan:  Formatting of this note might be different from the original. Uncertain etiology. Start Zyrtec nightly. If no improvement in 2 weeks will refer to Allergist.   Hot flashes due to surgical menopause 12/22/2015   Hypertension    Incontinence of urine    Irregular heart beat 03/03/2013   Formatting of this note might be different from the original. Last Assessment & Plan:  Patient feeling skipped beats which are increasing in frequency. Associated with feeling lightheaded, fatigued. Will check metabolic panel, TSH. Patient also reports that her husband says she snores. I am ordering a sleep  study to r/o OSA which may cause irregular heart beat. EKG was done and shows a minimal ST    Lateral epicondylitis, left elbow 07/23/2017   Left elbow pain 04/28/2020   Formatting of this note might be different from the original. Added automatically from request for surgery [id number]   Left hip pain 06/28/2013   Formatting of this note might be different from the original. Last Assessment & Plan:  Suspect this is tendinitis involving the iliopsoas tendon. Send for left hip x-ray to rule out any structural abnormalities. Start anti-inflammatories such as ibuprofen 600 mg every 6 hours for the next 3-4 days, ice for 15 minutes 3-4 times a day for the next 3-4 days and rest. Once inflammation subsides recomm   Left lateral epicondylitis 04/28/2020   Formatting of this note might be different from the original. Added automatically from request for surgery [id number]   Left-sided chest pain 01/25/2021   Low back pain 03/09/2021   Major depression, recurrent, chronic ([place])  05/09/2017   Malignant neoplasm of skin 03/09/2021   Placed consult for removal at [facility] clinic   Malignant tumor of breast ([place]) 07/01/2021   Mid back pain 10/28/2021   Migraine headache 03/09/2021   Pt with hx of migraine ha needs refill of meds today   Mixed dyslipidemia 02/08/2021   Need for prophylactic vaccination and inoculation against influenza 03/09/2021   Nodule of finger of both hands 08/18/2016   Numbness of left hand 09/03/2017   Formatting of this note might be different from the original. Last Assessment & Plan:  C8 distribution versus ulnar nerve. She does have a history of a multilevel cervical fusion. Symptoms have been present for a long time now, it sounds as her neurosurgeon was planning a repeat MRI. I'm going to order a nerve conduction study, repeat x-rays, depending on what we see I may order an MRI with and wi   Obesity (BMI 30-39.9) 02/08/2021   OCD (obsessive compulsive disorder) 12/14/2017   Open wound  11/24/2013   Formatting of this note might be different from the original. Last Assessment & Plan:  Formatting of this note might be different from the original. Keep clean and observe for sx of infection. Rx for Augmentin to begin prn infection. FU for any wound problems. Last Assessment & Plan:  Formatting of this note might be different from the original. Keep clean and observe for sx of infection. Rx for A   OSA (obstructive sleep apnea) 06/15/2021   On CPAP. 05/2021. Managed by neurology.    Osteopenia of multiple sites 06/14/2021   Other dysfunctions of sleep stages or arousal from sleep 03/09/2021   Pt offered Lunest or change to non abuse med.  Pt declines.  Will write for in house meds as noted.  Pt to f/u. PRN   Overweight (BMI 25.0-29.9) 03/27/2018   Palpitations 03/21/2013   Formatting of this note might be different from the original. Last Assessment & Plan:  Pt has seen cardiology in past She was put on BB but it made her too tired She was given norvasc for bp but has not started it yet Probably related to recent increase in stress and anxiety   Pre-diabetes 10/30/2018   Premenopausal menorrhagia 03/09/2021   HOT flashes- was on OCp prior- she dc'd has appt c GYN provider in a few weeks - also recently dc'd her zoloft- should restart zoloft for vasomotor sx will given ambien prn sleep difficulties see below - also pt to keep appt c GYN provider do not suggest altering hormones by me today as she is managed by specialist - discussed calcium also see below- avoid spicy foods and caffeine   Primary insomnia 03/27/2018   Primary osteoarthritis of left hip 05/08/2019   Formatting of this note might be different from the original. Last Assessment & Plan:  Formatting of this note might be different from the original. Alec has 2 pain generators, the hip joint on the greater trochanteric bursa. We injected the left hip joint at the last visit, and her groin pain has completely resolved. She still  has some lateral pain referrable to the bursa, hip abductors are weak   PVC (premature ventricular contraction) 01/25/2021   RLQ abdominal pain 01/26/2014   Formatting of this note might be different from the original. Last Assessment & Plan:  RLQ tenderness with palpation and frequent nausea - concerning for appendicitis. Also, her report of symptoms point toward gallbladder. Recommend CT of abdomen/pelvis. Check cbc and cmet. She  wanted to wait to have CT done on Wednesday. Instructed to call if symptoms worsen.   S/P mastectomy, right 08/22/2021   Sciatica 03/09/2021   referral to ortho; lumbar x-ray pending   Screening for diabetes mellitus 12/22/2015   Shoulder pain 12/19/2017   Sleep disturbance 03/03/2013   Formatting of this note might be different from the original. Last Assessment & Plan:  Formatting of this note might be different from the original. Re-signed CSC since I could not find the one on file. She has been on Ambien for years and it is helpful 5 mg a night (halves her 10 mg). Will continue this.   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   Herpes simplex 03/09/2021   Malignant neoplasm of skin 03/09/2021   Migraine headache 03/09/2021   Sciatica 03/09/2021   Mixed dyslipidemia 02/08/2021   Hand pain 02/07/2021   Chicken pox    Incontinence of urine    Left-sided chest pain 01/25/2021   PVC (premature  ventricular contraction) 01/25/2021   Erythematous papules of skin 03/30/2020   Gallstones 12/31/2019   Primary osteoarthritis of left hip 05/08/2019   Pre-diabetes 10/30/2018   Degenerative disc disease, lumbar 05/16/2018   Primary insomnia 03/27/2018   Overweight (BMI 25.0-29.9) 03/27/2018   Family history of early CAD 03/04/2018   Chest tightness or pressure 02/24/2018   Cervical radiculopathy 12/19/2017   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   OCD (obsessive compulsive disorder) 12/14/2017   Numbness of left hand 09/03/2017   Major depression, recurrent, chronic ([place]) 05/09/2017   Diverticulitis of large intestine without perforation or abscess 01/23/2017   Abnormal weight gain 11/18/2016   Nodule of finger of both hands 08/18/2016   Hidradenitis suppurativa 12/22/2015   Hot flashes due to surgical menopause 12/22/2015   Dyslipidemia (high LDL; low HDL) 12/22/2015   Essential hypertension, benign 12/22/2015   Vaginal dryness, menopausal 12/22/2015   Anxiety 12/22/2015   Endometriosis 07/08/2015   Fibroids 07/08/2015   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 Authorizing Provider  nitrofurantoin, macrocrystal-monohydrate, (MACROBID) 100 MG capsule Take 1  capsule (100 mg total) by mouth 2 (two) times daily. 09/13/22  Yes Raylene Everts, MD  phenazopyridine (PYRIDIUM) 200 MG tablet Take 1 tablet (200 mg total) by mouth 3 (three) times daily. 09/13/22  Yes Raylene Everts, MD  escitalopram  (LEXAPRO) 10 MG tablet Take 1 tablet (10 mg total) by mouth daily. 05/11/22   Merian Capron, MD  zolpidem (AMBIEN) 10 MG tablet TAKE 1 TABLET(10 MG) BY MOUTH AT BEDTIME AS NEEDED FOR SLEEP 02/14/22   Donella Stade, PA-C    Family History Family History  Problem Relation Age of Onset   Hyperlipidemia Mother    Ulcerative colitis Mother    Heart disease Father 50       CAD   Hypertension Father    Diverticulitis Father    Hypertension Brother    Cancer Maternal Grandfather 72       Stomach Cancer   Cancer Paternal Grandmother 61       Uterine Cancer   Stroke Paternal Grandfather 62       CVA   Cancer Maternal Grandmother     Social History Social History   Tobacco Use   Smoking status: Never   Smokeless tobacco: Never  Vaping Use   Vaping Use: Never used  Substance Use Topics   Alcohol use: No    Alcohol/week: 2.0 standard drinks of alcohol    Types: 2 Cans of beer per week    Comment: Occasionally    Drug use: No     Allergies   Promethazine   Review of Systems Review of Systems   Physical Exam Triage Vital Signs ED Triage Vitals  Enc Vitals Group     BP 09/13/22 1445 (!) 168/91     Pulse Rate 09/13/22 1445 71     Resp 09/13/22 1445 18     Temp 09/13/22 1445 97.9 F (36.6 C)     Temp Source 09/13/22 1445 Oral     SpO2 09/13/22 1445 99 %     Weight --      Height --      Head Circumference --      Peak Flow --      Pain Score 09/13/22 1446 6     Pain Loc --      Pain Edu? --      Excl. in [place]? --    No data found.  Updated Vital Signs BP (!) 168/91 (BP Location: Left Arm)   Pulse 71   Temp 97.9 F (36.6 C) (Oral)   Resp 18   SpO2 99%        Physical Exam Constitutional:      General: She is not in acute distress.    Appearance: She is well-developed.  HENT:     Head: Normocephalic and atraumatic.  Eyes:     Conjunctiva/sclera: Conjunctivae normal.     Pupils: Pupils are equal, round, and reactive to light.  Cardiovascular:      Rate and Rhythm: Normal rate.  Pulmonary:     Effort: Pulmonary effort is normal. No respiratory distress.  Abdominal:     General: There is no distension.     Palpations: Abdomen is soft.     Tenderness: There is no right CVA tenderness or left CVA tenderness.  Musculoskeletal:        General: Normal range of motion.     Cervical back: Normal range of motion.  Skin:    General: Skin is warm and dry.  Neurological:  Mental Status: She is alert.      UC Treatments / Results  Labs (all labs ordered are listed, but only abnormal results are displayed) Labs Reviewed  POCT URINALYSIS DIP (MANUAL ENTRY) - Abnormal; Notable for the following components:      Result Value   Color, UA light yellow (*)    Spec Grav, UA <=1.005 (*)    Blood, UA large (*)    Nitrite, UA Positive (*)    Leukocytes, UA Large (3+) (*)    All other components within normal limits  URINE CULTURE    EKG   Radiology No results found.  Procedures Procedures (including critical care time)  Medications Ordered in UC Medications - No data to display  Initial Impression / Assessment and Plan / UC Course  I have reviewed the triage vital signs and the nursing notes.  Pertinent labs & imaging results that were available during my care of the patient were reviewed by me and considered in my medical decision making (see chart for details).     Final Clinical Impressions(s) / UC Diagnoses   Final diagnoses:  Dysuria  Cystitis     Discharge Instructions      Continue to drink lots of water Take the antibiotic 2 times a day Take Pyridium as needed for urinary pain  Your urine culture will be available in 2 to 3 days.  You will be called if a change in antibiotic is necessary   ED Prescriptions     Medication Sig Dispense Auth. Provider   nitrofurantoin, macrocrystal-monohydrate, (MACROBID) 100 MG capsule Take 1 capsule (100 mg total) by mouth 2 (two) times daily. 10 capsule Raylene Everts, MD   phenazopyridine (PYRIDIUM) 200 MG tablet Take 1 tablet (200 mg total) by mouth 3 (three) times daily. 6 tablet Raylene Everts, MD      PDMP not reviewed this encounter.   Raylene Everts, MD 09/13/22 [phone]

## 2022-09-14 ENCOUNTER — Telehealth: Payer: Self-pay

## 2022-09-14 NOTE — Telephone Encounter (Signed)
TC to f/u with pt after yesterday's visit to [facility]. Pt states she is feeling about the same as yesterday but she realizes it will take awhile to see any effects from Rx. Advised to continue full course of antibiotics, drink plenty of fluids, get rest, and f/u w/ PCP or UC if unresolved. She has no questions or concerns at this time.

## 2022-09-16 LAB — URINE CULTURE: Culture: >=100000 — AB

## 2022-09-18 ENCOUNTER — Encounter: Payer: Self-pay | Admitting: Hematology & Oncology

## 2022-09-18 ENCOUNTER — Inpatient Hospital Stay: Payer: BC Managed Care – PPO | Attending: Hematology & Oncology

## 2022-09-18 ENCOUNTER — Inpatient Hospital Stay (HOSPITAL_BASED_OUTPATIENT_CLINIC_OR_DEPARTMENT_OTHER): Payer: BC Managed Care – PPO | Admitting: Hematology & Oncology

## 2022-09-18 ENCOUNTER — Other Ambulatory Visit: Payer: Self-pay

## 2022-09-18 VITALS — BP 148/86 | HR 75 | Temp 98.6°F | Resp 16 | Wt 161.0 lb

## 2022-09-18 DIAGNOSIS — M8580 Other specified disorders of bone density and structure, unspecified site: Secondary | ICD-10-CM

## 2022-09-18 DIAGNOSIS — D0511 Intraductal carcinoma in situ of right breast: Secondary | ICD-10-CM | POA: Diagnosis not present

## 2022-09-18 DIAGNOSIS — Z9011 Acquired absence of right breast and nipple: Secondary | ICD-10-CM | POA: Insufficient documentation

## 2022-09-18 DIAGNOSIS — E559 Vitamin D deficiency, unspecified: Secondary | ICD-10-CM

## 2022-09-18 LAB — CBC WITH DIFFERENTIAL (CANCER CENTER ONLY)
Abs Immature Granulocytes: 0.02 10*3/uL (ref 0.00–0.07)
Basophils Absolute: 0.1 10*3/uL (ref 0.0–0.1)
Basophils Relative: 1 %
Eosinophils Absolute: 0.1 10*3/uL (ref 0.0–0.5)
Eosinophils Relative: 1 %
HCT: 44.7 % (ref 36.0–46.0)
Hemoglobin: 15.1 g/dL — ABNORMAL HIGH (ref 12.0–15.0)
Immature Granulocytes: 0 %
Lymphocytes Relative: 28 %
Lymphs Abs: 1.6 10*3/uL (ref 0.7–4.0)
MCH: 30.3 pg (ref 26.0–34.0)
MCHC: 33.8 g/dL (ref 30.0–36.0)
MCV: 89.6 fL (ref 80.0–100.0)
Monocytes Absolute: 0.5 10*3/uL (ref 0.1–1.0)
Monocytes Relative: 9 %
Neutro Abs: 3.3 10*3/uL (ref 1.7–7.7)
Neutrophils Relative %: 61 %
Platelet Count: 300 10*3/uL (ref 150–400)
RBC: 4.99 MIL/uL (ref 3.87–5.11)
RDW: 12 % (ref 11.5–15.5)
WBC Count: 5.5 10*3/uL (ref 4.0–10.5)
nRBC: 0 % (ref 0.0–0.2)

## 2022-09-18 LAB — CMP (CANCER CENTER ONLY)
ALT: 14 U/L (ref 0–44)
AST: 15 U/L (ref 15–41)
Albumin: 4.8 g/dL (ref 3.5–5.0)
Alkaline Phosphatase: 109 U/L (ref 38–126)
Anion gap: 6 (ref 5–15)
BUN: 16 mg/dL (ref 6–20)
CO2: 29 mmol/L (ref 22–32)
Calcium: 10.1 mg/dL (ref 8.9–10.3)
Chloride: 104 mmol/L (ref 98–111)
Creatinine: 0.8 mg/dL (ref 0.44–1.00)
GFR, Estimated: >60 mL/min (ref >60)
Glucose, Bld: 100 mg/dL — ABNORMAL HIGH (ref 70–99)
Potassium: 4.8 mmol/L (ref 3.5–5.1)
Sodium: 139 mmol/L (ref 135–145)
Total Bilirubin: 0.8 mg/dL (ref 0.3–1.2)
Total Protein: 7.6 g/dL (ref 6.5–8.1)

## 2022-09-18 LAB — VITAMIN D 25 HYDROXY (VIT D DEFICIENCY, FRACTURES): Vit D, 25-Hydroxy: 45.43 ng/mL (ref 30–100)

## 2022-09-18 NOTE — Progress Notes (Signed)
Hematology and Oncology Follow Up Visit  Kayla Hood [id number] 11/25/1966 56 y.o. 09/18/2022   Principle Diagnosis:  DCIS of the right breast, ER/PR + Osteopenia on calcium and vitamin D supplementation   Current Therapy:        Status right mastectomy in August 2022.              Interim History:  Kayla Hood is here today for follow-up.  We saw her back in June.  Since then, she been doing pretty well.  She is going to be scheduled for her final breast surgery for the l right implant.  Apparently, she is going to have a smaller implant put in.  This will try to help match with the left breast.  This is going to be I think in December.  She is feeling okay.  She had a good summer.  She and her husband went down to [place], [place].  She has had no problems with fever, sweats or chills.  She is worried about the fact that she had a CT scan done last year which showed a questionable right adnexal mass.  I cannot imagine that this is a issue.  However, we will go ahead and repeat a CT scan to make sure nothing is going on.  She has had no change in bowel or bladder habits.  She has had no nausea or vomiting.  She had no bleeding.  She has had no rashes.  Is been no leg swelling.  Overall, I would say performance status is probably ECOG 0.    Medications:  Allergies as of 09/18/2022       Reactions   Promethazine Anaphylaxis, Other (See Comments)        Medication List        Accurate as of September 18, 2022 12:13 PM. If you have any questions, ask your nurse or doctor.          STOP taking these medications    nitrofurantoin (macrocrystal-monohydrate) 100 MG capsule Commonly known as: MACROBID Stopped by: Volanda Napoleon, MD       TAKE these medications    escitalopram 10 MG tablet Commonly known as: LEXAPRO Take 1 tablet (10 mg total) by mouth daily.   phenazopyridine 200 MG tablet Commonly known as: PYRIDIUM Take 1 tablet (200 mg total) by mouth 3  (three) times daily.   zolpidem 10 MG tablet Commonly known as: AMBIEN TAKE 1 TABLET(10 MG) BY MOUTH AT BEDTIME AS NEEDED FOR SLEEP        Allergies:  Allergies  Allergen Reactions   Promethazine Anaphylaxis and Other (See Comments)    Past Medical History, Surgical history, Social history, and Family History were reviewed and updated.  Review of Systems: Review of Systems  Constitutional: Negative.   HENT: Negative.    Eyes: Negative.   Respiratory: Negative.    Cardiovascular: Negative.   Gastrointestinal: Negative.   Genitourinary: Negative.   Musculoskeletal: Negative.   Skin: Negative.   Neurological:  Negative for dizziness.  Endo/Heme/Allergies: Negative.   Psychiatric/Behavioral:  The patient is nervous/anxious.      Physical Exam:  weight is 161 lb (73 kg). Her oral temperature is 98.6 F (37 C). Her blood pressure is 148/86 (abnormal) and her pulse is 75. Her respiration is 16 and oxygen saturation is 99%.   Wt Readings from Last 3 Encounters:  09/18/22 161 lb (73 kg)  06/02/22 165 lb 0.6 oz (74.9 kg)  04/20/22 170 lb 1.3  oz (77.1 kg)    Physical Exam Vitals reviewed.  Constitutional:      Comments: Left breast shows reduction surgical scars.  There is no masses in the left breast.  There is no nipple discharge or bleeding.  There is no erythema on the left breast.  She has no left axillary adenopathy.  Right chest wall shows mastectomy with an implant.  This is nontender.  There is no erythema.  There is no right axillary adenopathy.  HENT:     Head: Normocephalic and atraumatic.  Eyes:     Pupils: Pupils are equal, round, and reactive to light.  Cardiovascular:     Rate and Rhythm: Normal rate and regular rhythm.     Heart sounds: Normal heart sounds.  Pulmonary:     Effort: Pulmonary effort is normal.     Breath sounds: Normal breath sounds.  Abdominal:     General: Bowel sounds are normal.     Palpations: Abdomen is soft.  Musculoskeletal:         General: No tenderness or deformity. Normal range of motion.     Cervical back: Normal range of motion.  Lymphadenopathy:     Cervical: No cervical adenopathy.  Skin:    General: Skin is warm and dry.     Findings: No erythema or rash.  Neurological:     Mental Status: She is alert and oriented to person, place, and time.  Psychiatric:        Behavior: Behavior normal.        Thought Content: Thought content normal.        Judgment: Judgment normal.      Lab Results  Component Value Date   WBC 5.5 09/18/2022   HGB 15.1 (H) 09/18/2022   HCT 44.7 09/18/2022   MCV 89.6 09/18/2022   PLT 300 09/18/2022   Lab Results  Component Value Date   FERRITIN 26 06/02/2022   IRON 76 06/02/2022   TIBC 440 06/02/2022   UIBC 364 06/02/2022   IRONPCTSAT 17 06/02/2022   Lab Results  Component Value Date   RBC 4.99 09/18/2022   No results found for: "KPAFRELGTCHN", "LAMBDASER", "KAPLAMBRATIO" No results found for: "IGGSERUM", "IGA", "IGMSERUM" No results found for: "TOTALPROTELP", "ALBUMINELP", "A1GS", "A2GS", "BETS", "BETA2SER", "GAMS", "MSPIKE", "SPEI"   Chemistry      Component Value Date/Time   NA 139 09/18/2022 1122   K 4.8 09/18/2022 1122   CL 104 09/18/2022 1122   CO2 29 09/18/2022 1122   BUN 16 09/18/2022 1122   CREATININE 0.80 09/18/2022 1122   CREATININE 0.70 10/26/2020 1528      Component Value Date/Time   CALCIUM 10.1 09/18/2022 1122   ALKPHOS 109 09/18/2022 1122   AST 15 09/18/2022 1122   ALT 14 09/18/2022 1122   BILITOT 0.8 09/18/2022 1122       Impression and Plan: Kayla Hood is a very pleasant 56 yo caucasian female with diagnosis of 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  She underwent a mastectomy in August of last year.  She cannot have surgery for the right breast.  I think this is going to be in December.  I told her that I do not think that her DCIS was actual breast cancer.  I consider it as a pre-cancer.  It really does  not indicate that she will have a high risk of any other cancer.  We will go ahead and get a CT scan.  I  will get this next month.  I would like to see her back after the holidays.  I think this would be very reasonable.  Volanda Napoleon, MD 9/25/202312:13 PM

## 2022-09-25 ENCOUNTER — Encounter: Payer: Self-pay | Admitting: Physician Assistant

## 2022-10-05 ENCOUNTER — Encounter: Payer: Self-pay | Admitting: Physician Assistant

## 2022-10-12 ENCOUNTER — Ambulatory Visit (HOSPITAL_COMMUNITY): Payer: BC Managed Care – PPO | Admitting: Psychiatry

## 2022-10-13 ENCOUNTER — Other Ambulatory Visit: Payer: BC Managed Care – PPO

## 2022-10-16 ENCOUNTER — Encounter: Payer: Self-pay | Admitting: Physician Assistant

## 2022-10-16 ENCOUNTER — Ambulatory Visit (INDEPENDENT_AMBULATORY_CARE_PROVIDER_SITE_OTHER): Payer: BC Managed Care – PPO | Admitting: Physician Assistant

## 2022-10-16 VITALS — BP 164/96 | HR 65 | Ht 68.0 in | Wt 162.0 lb

## 2022-10-16 DIAGNOSIS — F339 Major depressive disorder, recurrent, unspecified: Secondary | ICD-10-CM | POA: Diagnosis not present

## 2022-10-16 DIAGNOSIS — Z13220 Encounter for screening for lipoid disorders: Secondary | ICD-10-CM | POA: Diagnosis not present

## 2022-10-16 DIAGNOSIS — F5101 Primary insomnia: Secondary | ICD-10-CM | POA: Diagnosis not present

## 2022-10-16 DIAGNOSIS — R030 Elevated blood-pressure reading, without diagnosis of hypertension: Secondary | ICD-10-CM

## 2022-10-16 DIAGNOSIS — F419 Anxiety disorder, unspecified: Secondary | ICD-10-CM | POA: Diagnosis not present

## 2022-10-16 MED ORDER — ZOLPIDEM TARTRATE 10 MG PO TABS: ORAL_TABLET | ORAL | 5 refills | Status: DC

## 2022-10-16 MED ORDER — ESCITALOPRAM OXALATE 10 MG PO TABS: 10.0000 mg | ORAL_TABLET | Freq: Every day | ORAL | 3 refills | Status: DC

## 2022-10-16 NOTE — Progress Notes (Signed)
Established Patient Office Visit  Subjective   Patient ID: Kayla Hood, female    DOB: October 03, 1966  Age: 56 y.o. MRN: [id number]  Chief Complaint  Patient presents with   Insomnia   Follow-up    HPI Pt is a 55 yo female with MDD/Anxiety, elevated BP, dyslipidemia, insomnia who presents to the clinic for follow up and medication refills.   Her mood is great. No concerns or complaints. Lexapro '5mg'$  is doing fantastic.   Checking BP at home and in the 130/70s. No CP, palpitations, headaches or vision changes.   Sleeping well with ambien. No concerns.   .. 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   Hidradenitis suppurativa 12/22/2015   Hot flashes due to surgical menopause 12/22/2015   Dyslipidemia (high LDL; low HDL) 12/22/2015   Essential hypertension, benign 12/22/2015   Vaginal dryness, menopausal 12/22/2015   Anxiety 12/22/2015   Nodule of finger of both hands 08/18/2016   Abnormal weight gain 11/18/2016   Diverticulitis of large intestine without perforation or abscess 01/23/2017   Major depression, recurrent, chronic (HCC) 05/09/2017   Numbness of left hand 09/03/2017   Family history of early CAD 03/04/2018   Primary insomnia 03/27/2018   Overweight (BMI 25.0-29.9) 03/27/2018   Degenerative disc disease, lumbar 05/16/2018   Pre-diabetes 10/30/2018   Primary osteoarthritis of left hip 05/08/2019   Gallstones 12/31/2019   Erythematous papules of skin 03/30/2020   Cervical radiculopathy 12/19/2017   Left-sided chest pain 01/25/2021   PVC (premature ventricular contraction) 01/25/2021   Chicken pox    Incontinence of urine    OCD (obsessive compulsive disorder) 12/14/2017   Chest tightness or pressure 02/24/2018   Hand pain 02/07/2021   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   Mixed dyslipidemia 02/08/2021   Herpes simplex 03/09/2021   Malignant neoplasm of skin  03/09/2021   Migraine headache 03/09/2021   Sciatica 03/09/2021   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   OSA (obstructive sleep apnea) 06/15/2021   S/P mastectomy, right 08/22/2021   Fatigue 08/23/2021   Adjustment reaction with anxiety and depression 08/23/2021   Osteopenia of multiple sites 06/14/2021   Breast pain, left 01/10/2022   Breast infection in female 01/10/2022   Malignant tumor of breast ([place]) 07/01/2021   ETD (Eustachian tube dysfunction), right 02/15/2022   Trochanteric bursitis of left hip 06/30/2022   Resolved Ambulatory Problems    Diagnosis Date Noted   Abnormal chest x-ray 03/03/2013   Irregular heart beat 03/03/2013   Sleep disturbance 03/03/2013   General medical exam 03/03/2013   Palpitations 03/21/2013   Left hip pain 06/28/2013   Tendonitis of shoulder 09/01/2013   Hives 01/26/2014   RLQ abdominal pain 01/26/2014   Facial edema 02/04/2014   Viral URI 03/09/2015   Anxiety state 06/07/2015   Depression with anxiety 07/08/2015   Exposure to blood or body fluid 11/24/2013   Screening for diabetes mellitus 12/22/2015   Ear itching 03/12/2017   Lateral epicondylitis, left elbow 07/23/2017   Class 1 obesity due to excess calories without serious comorbidity with body mass index (BMI) of 30.0 to 30.9 in adult 11/01/2018   Elevated LFTs 12/31/2019   Bruise of breast 03/29/2020   Fullness of breast 03/30/2020   History of left tennis elbow 07/23/2017   Elevated alkaline phosphatase level 01/24/2021   Allergy    Depression 12/14/2017   Hypertension  History of potentially hazardous body fluid exposure 11/24/2013   Open wound 11/24/2013   Left lateral epicondylitis 04/28/2020   Left elbow pain 04/28/2020   Shoulder pain 12/19/2017   Obesity (BMI 30-39.9) 02/08/2021   Coccydynia 03/09/2021   Low back pain 03/09/2021   Need for prophylactic vaccination and inoculation against influenza 03/09/2021   Other dysfunctions of sleep  stages or arousal from sleep 03/09/2021   Premenopausal menorrhagia 03/09/2021   Anxiety disorder due to medical condition 03/09/2021   Elevated blood pressure reading 10/28/2021   Mid back pain 10/28/2021   No Additional Past Medical History     Review of Systems  All other systems reviewed and are negative.     Objective:     BP (!) 164/96   Pulse 65   Ht '5\' 8"'$  (1.727 m)   Wt 162 lb (73.5 kg)   SpO2 100%   BMI 24.63 kg/m  BP Readings from Last 3 Encounters:  10/16/22 (!) 164/96  09/18/22 (!) 148/86  09/13/22 (!) 168/91   Wt Readings from Last 3 Encounters:  10/16/22 162 lb (73.5 kg)  09/18/22 161 lb (73 kg)  06/02/22 165 lb 0.6 oz (74.9 kg)    .Marland Kitchen    10/16/2022    2:43 PM 03/29/2020    9:59 AM 08/13/2019    2:16 PM 11/19/2018    9:21 AM 03/27/2018    9:28 AM  Depression screen PHQ 2/9  Decreased Interest 0 0 0  0  Down, Depressed, Hopeless 0 0 0 0 0  PHQ - 2 Score 0 0 0 0 0  Altered sleeping 0 0 1 0   Tired, decreased energy 0 0 1 0   Change in appetite 0 0 0 0   Feeling bad or failure about yourself  0 0 0 0   Trouble concentrating 0 0 0 0   Moving slowly or fidgety/restless 0 0 0 0   Suicidal thoughts 0 0 0 0   PHQ-9 Score 0 0 2 0   Difficult doing work/chores Not difficult at all Not difficult at all Not difficult at all Not difficult at all    .Marland Kitchen    10/16/2022    2:43 PM 03/29/2020    9:59 AM 08/13/2019    2:16 PM 11/19/2018    9:22 AM  GAD 7 : Generalized Anxiety Score  Nervous, Anxious, on Edge 0 0 1 0  Control/stop worrying 0 0 0 0  Worry too much - different things 0 0 0 0  Trouble relaxing 0 0 0 0  Restless 0 0 0 0  Easily annoyed or irritable 0 0 1 0  Afraid - awful might happen 0 0 0 0  Total GAD 7 Score 0 0 2 0  Anxiety Difficulty Not difficult at all Not difficult at all Not difficult at all Not difficult at all      Physical Exam Constitutional:      Appearance: Normal appearance. She is obese.  Cardiovascular:     Rate and  Rhythm: Normal rate and regular rhythm.     Pulses: Normal pulses.     Heart sounds: Normal heart sounds.  Pulmonary:     Effort: Pulmonary effort is normal.     Breath sounds: Normal breath sounds.  Musculoskeletal:     Right lower leg: No edema.     Left lower leg: No edema.  Neurological:     General: No focal deficit present.     Mental Status: She  is alert and oriented to person, place, and time.  Psychiatric:        Mood and Affect: Mood normal.        Assessment & Plan:  Marland KitchenMarland KitchenHailyn was seen today for insomnia and follow-up.  Diagnoses and all orders for this visit:  Primary insomnia -     zolpidem (AMBIEN) 10 MG tablet; TAKE 1 TABLET(10 MG) BY MOUTH AT BEDTIME AS NEEDED FOR SLEEP  Lipid screening -     Lipid Panel w/reflex Direct LDL  Major depression, recurrent, chronic (HCC) -     escitalopram (LEXAPRO) 10 MG tablet; Take 1 tablet (10 mg total) by mouth daily.  Anxiety -     escitalopram (LEXAPRO) 10 MG tablet; Take 1 tablet (10 mg total) by mouth daily.   PHQ/GAD numbers look great Refilled lexapro Needs lipid screening  Ambien refilled for 6 months   Iran Planas, PA-C

## 2022-10-23 ENCOUNTER — Ambulatory Visit (INDEPENDENT_AMBULATORY_CARE_PROVIDER_SITE_OTHER): Payer: BC Managed Care – PPO

## 2022-10-23 DIAGNOSIS — D0511 Intraductal carcinoma in situ of right breast: Secondary | ICD-10-CM

## 2022-10-23 MED ORDER — IOHEXOL 300 MG/ML  SOLN
100.0000 mL | Freq: Once | INTRAMUSCULAR | Status: AC | PRN
  Administered 2022-10-23: 100 mL via INTRAVENOUS

## 2022-10-24 ENCOUNTER — Telehealth: Payer: Self-pay

## 2022-10-24 ENCOUNTER — Encounter: Payer: Self-pay | Admitting: Physician Assistant

## 2022-10-24 DIAGNOSIS — E781 Pure hyperglyceridemia: Secondary | ICD-10-CM | POA: Insufficient documentation

## 2022-10-24 LAB — LIPID PANEL W/REFLEX DIRECT LDL
Cholesterol: 222 mg/dL — ABNORMAL HIGH (ref <200)
HDL: 58 mg/dL (ref >=50)
LDL Cholesterol (Calc): 126 mg/dL (calc) — ABNORMAL HIGH
Non-HDL Cholesterol (Calc): 164 mg/dL (calc) — ABNORMAL HIGH (ref <130)
Total CHOL/HDL Ratio: 3.8 (calc) (ref <5.0)
Triglycerides: 250 mg/dL — ABNORMAL HIGH (ref <150)

## 2022-10-24 NOTE — Telephone Encounter (Signed)
-----   Message from Volanda Napoleon, MD sent at 10/24/2022  2:00 PM EDT ----- The CT scan only shows fibroids.  Nothing has changed in size.  Everything looks stable.  Thanks.  Laurey Arrow

## 2022-10-24 NOTE — Progress Notes (Signed)
Kayla Hood,   Your good cholesterol went up and your bad cholesterol went down! Your Triglycerides also went down. GREAT job!  Overall CV risk decreased as well.   Marland Kitchen.The 10-year ASCVD risk score (Arnett DK, et al., 2019) is: 2.8%   Values used to calculate the score:     Age: 56 years     Sex: Female     Is Non-Hispanic African American: No     Diabetic: No     Tobacco smoker: No     Systolic Blood Pressure: 641 mmHg     Is BP treated: No     HDL Cholesterol: 58 mg/dL     Total Cholesterol: 222 mg/dL  Your TG continue to be above 150. Other than continue to watch sugars and carbs in diet and exercise you could consider fish oil '4000mg'$  daily to see if that will help gt your TG down.

## 2023-01-18 ENCOUNTER — Encounter: Payer: Self-pay | Admitting: Hematology & Oncology

## 2023-01-18 ENCOUNTER — Inpatient Hospital Stay (HOSPITAL_BASED_OUTPATIENT_CLINIC_OR_DEPARTMENT_OTHER): Payer: BC Managed Care – PPO | Admitting: Hematology & Oncology

## 2023-01-18 ENCOUNTER — Inpatient Hospital Stay: Payer: BC Managed Care – PPO | Attending: Hematology & Oncology

## 2023-01-18 ENCOUNTER — Other Ambulatory Visit: Payer: Self-pay

## 2023-01-18 VITALS — BP 138/80 | HR 72 | Temp 98.4°F | Resp 18 | Ht 68.0 in | Wt 159.1 lb

## 2023-01-18 DIAGNOSIS — D0511 Intraductal carcinoma in situ of right breast: Secondary | ICD-10-CM | POA: Insufficient documentation

## 2023-01-18 DIAGNOSIS — M8580 Other specified disorders of bone density and structure, unspecified site: Secondary | ICD-10-CM | POA: Diagnosis not present

## 2023-01-18 DIAGNOSIS — C4490 Unspecified malignant neoplasm of skin, unspecified: Secondary | ICD-10-CM

## 2023-01-18 LAB — CBC WITH DIFFERENTIAL (CANCER CENTER ONLY)
Abs Immature Granulocytes: 0.02 10*3/uL (ref 0.00–0.07)
Basophils Absolute: 0.1 10*3/uL (ref 0.0–0.1)
Basophils Relative: 1 %
Eosinophils Absolute: 0.1 10*3/uL (ref 0.0–0.5)
Eosinophils Relative: 1 %
HCT: 42.1 % (ref 36.0–46.0)
Hemoglobin: 13.9 g/dL (ref 12.0–15.0)
Immature Granulocytes: 0 %
Lymphocytes Relative: 37 %
Lymphs Abs: 2 10*3/uL (ref 0.7–4.0)
MCH: 30.2 pg (ref 26.0–34.0)
MCHC: 33 g/dL (ref 30.0–36.0)
MCV: 91.3 fL (ref 80.0–100.0)
Monocytes Absolute: 0.4 10*3/uL (ref 0.1–1.0)
Monocytes Relative: 8 %
Neutro Abs: 2.9 10*3/uL (ref 1.7–7.7)
Neutrophils Relative %: 53 %
Platelet Count: 276 10*3/uL (ref 150–400)
RBC: 4.61 MIL/uL (ref 3.87–5.11)
RDW: 11.9 % (ref 11.5–15.5)
WBC Count: 5.5 10*3/uL (ref 4.0–10.5)
nRBC: 0 % (ref 0.0–0.2)

## 2023-01-18 LAB — CMP (CANCER CENTER ONLY)
ALT: 12 U/L (ref 0–44)
AST: 17 U/L (ref 15–41)
Albumin: 4.8 g/dL (ref 3.5–5.0)
Alkaline Phosphatase: 82 U/L (ref 38–126)
Anion gap: 9 (ref 5–15)
BUN: 14 mg/dL (ref 6–20)
CO2: 28 mmol/L (ref 22–32)
Calcium: 10.4 mg/dL — ABNORMAL HIGH (ref 8.9–10.3)
Chloride: 105 mmol/L (ref 98–111)
Creatinine: 0.8 mg/dL (ref 0.44–1.00)
GFR, Estimated: >60 mL/min (ref >60)
Glucose, Bld: 107 mg/dL — ABNORMAL HIGH (ref 70–99)
Potassium: 4.4 mmol/L (ref 3.5–5.1)
Sodium: 142 mmol/L (ref 135–145)
Total Bilirubin: 0.9 mg/dL (ref 0.3–1.2)
Total Protein: 7.2 g/dL (ref 6.5–8.1)

## 2023-01-18 LAB — LACTATE DEHYDROGENASE: LDH: 141 U/L (ref 98–192)

## 2023-01-18 NOTE — Progress Notes (Signed)
Hematology and Oncology Follow Up Visit  Kayla Hood [id number] 1966/02/27 57 y.o. 01/18/2023   Principle Diagnosis:  DCIS of the right breast, ER/PR + Osteopenia on calcium and vitamin D supplementation   Current Therapy:        Status right mastectomy in August 2022.              Interim History:  Kayla Hood is here today for follow-up.  We last saw Kayla Hood back in September.  Since then, she been doing pretty well.  She did have a follow-up CT scan done on 10/23/2022.  This did not show anything suspicious in the right adnexa.  She did have some uterine fibroids..  She did have the other breast surgery.  This was done in December.  She got through this very nicely.  She has had no problems with COVID.  There is been no issues with influenza.  There is been no cough or shortness of breath.  She has had no nausea or vomiting.  There has been no rashes.  She has had no bleeding.  There has been no headache.  She is still working.  She works for I think [employer].  Currently, I would say that Kayla Hood performance status is ECOG 0.     Medications:  Allergies as of 01/18/2023       Reactions   Promethazine Anaphylaxis, Other (See Comments)        Medication List        Accurate as of January 18, 2023  3:22 PM. If you have any questions, ask your nurse or doctor.          amLODipine 5 MG tablet Commonly known as: NORVASC Take 5 mg by mouth daily.   escitalopram 10 MG tablet Commonly known as: LEXAPRO Take 1 tablet (10 mg total) by mouth daily.   zolpidem 10 MG tablet Commonly known as: AMBIEN TAKE 1 TABLET(10 MG) BY MOUTH AT BEDTIME AS NEEDED FOR SLEEP        Allergies:  Allergies  Allergen Reactions   Promethazine Anaphylaxis and Other (See Comments)    Past Medical History, Surgical history, Social history, and Family History were reviewed and updated.  Review of Systems: Review of Systems  Constitutional: Negative.   HENT: Negative.     Eyes: Negative.   Respiratory: Negative.    Cardiovascular: Negative.   Gastrointestinal: Negative.   Genitourinary: Negative.   Musculoskeletal: Negative.   Skin: Negative.   Neurological:  Negative for dizziness.  Endo/Heme/Allergies: Negative.   Psychiatric/Behavioral:  The patient is nervous/anxious.      Physical Exam:  height is '5\' 8"'$  (1.727 m) and weight is 159 lb 1.9 oz (72.2 kg). Kayla Hood oral temperature is 98.4 F (36.9 C). Kayla Hood blood pressure is 138/80 and Kayla Hood pulse is 72. Kayla Hood respiration is 18 and oxygen saturation is 100%.   Wt Readings from Last 3 Encounters:  01/18/23 159 lb 1.9 oz (72.2 kg)  10/16/22 162 lb (73.5 kg)  09/18/22 161 lb (73 kg)    Physical Exam Vitals reviewed.  Constitutional:      Comments: Left breast shows reduction surgical scars.  There is no masses in the left breast.  There is no nipple discharge or bleeding.  There is no erythema on the left breast.  She has no left axillary adenopathy.  Right chest wall shows mastectomy with an implant.  This is nontender.  There is no erythema.  There is no right axillary adenopathy.  HENT:     Head: Normocephalic and atraumatic.  Eyes:     Pupils: Pupils are equal, round, and reactive to light.  Cardiovascular:     Rate and Rhythm: Normal rate and regular rhythm.     Heart sounds: Normal heart sounds.  Pulmonary:     Effort: Pulmonary effort is normal.     Breath sounds: Normal breath sounds.  Abdominal:     General: Bowel sounds are normal.     Palpations: Abdomen is soft.  Musculoskeletal:        General: No tenderness or deformity. Normal range of motion.     Cervical back: Normal range of motion.  Lymphadenopathy:     Cervical: No cervical adenopathy.  Skin:    General: Skin is warm and dry.     Findings: No erythema or rash.  Neurological:     Mental Status: She is alert and oriented to person, place, and time.  Psychiatric:        Behavior: Behavior normal.        Thought Content:  Thought content normal.        Judgment: Judgment normal.      Lab Results  Component Value Date   WBC 5.5 01/18/2023   HGB 13.9 01/18/2023   HCT 42.1 01/18/2023   MCV 91.3 01/18/2023   PLT 276 01/18/2023   Lab Results  Component Value Date   FERRITIN 26 06/02/2022   IRON 76 06/02/2022   TIBC 440 06/02/2022   UIBC 364 06/02/2022   IRONPCTSAT 17 06/02/2022   Lab Results  Component Value Date   RBC 4.61 01/18/2023   No results found for: "KPAFRELGTCHN", "LAMBDASER", "KAPLAMBRATIO" No results found for: "IGGSERUM", "IGA", "IGMSERUM" No results found for: "TOTALPROTELP", "ALBUMINELP", "A1GS", "A2GS", "BETS", "BETA2SER", "GAMS", "MSPIKE", "SPEI"   Chemistry      Component Value Date/Time   NA 142 01/18/2023 1414   K 4.4 01/18/2023 1414   CL 105 01/18/2023 1414   CO2 28 01/18/2023 1414   BUN 14 01/18/2023 1414   CREATININE 0.80 01/18/2023 1414   CREATININE 0.70 10/26/2020 1528      Component Value Date/Time   CALCIUM 10.4 (H) 01/18/2023 1414   ALKPHOS 82 01/18/2023 1414   AST 17 01/18/2023 1414   ALT 12 01/18/2023 1414   BILITOT 0.9 01/18/2023 1414       Impression and Plan: Kayla Hood is a very pleasant 57 yo caucasian female with diagnosis of 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  She underwent a mastectomy in August of last year.  She has not completed all of Kayla Hood surgery.  Hopefully, everything will continue to go well for Kayla Hood..  I think that we can get Kayla Hood back in 6 months.  Of note, she is going to participate in a marathon in March event.  She will walk 20 miles in 8 hours.  I am sure this will be done easily by Kayla Hood.  She looks like she is in great shape.  I know that she will continue to be very active.  This will certainly help Kayla Hood more than anything.   Kayla Napoleon, MD 1/25/20243:22 PM

## 2023-01-19 LAB — MISC LABCORP TEST (SEND OUT): Labcorp test code: 81950

## 2023-04-18 ENCOUNTER — Ambulatory Visit (INDEPENDENT_AMBULATORY_CARE_PROVIDER_SITE_OTHER): Payer: BC Managed Care – PPO | Admitting: Physician Assistant

## 2023-04-18 VITALS — BP 134/86 | HR 65 | Ht 68.0 in | Wt 157.0 lb

## 2023-04-18 DIAGNOSIS — F419 Anxiety disorder, unspecified: Secondary | ICD-10-CM | POA: Diagnosis not present

## 2023-04-18 DIAGNOSIS — Z9011 Acquired absence of right breast and nipple: Secondary | ICD-10-CM

## 2023-04-18 DIAGNOSIS — I10 Essential (primary) hypertension: Secondary | ICD-10-CM

## 2023-04-18 DIAGNOSIS — D0511 Intraductal carcinoma in situ of right breast: Secondary | ICD-10-CM

## 2023-04-18 DIAGNOSIS — F5101 Primary insomnia: Secondary | ICD-10-CM

## 2023-04-18 DIAGNOSIS — F339 Major depressive disorder, recurrent, unspecified: Secondary | ICD-10-CM

## 2023-04-18 MED ORDER — AMLODIPINE BESYLATE 5 MG PO TABS: 5.0000 mg | ORAL_TABLET | Freq: Every day | ORAL | 1 refills | Status: DC

## 2023-04-18 MED ORDER — ZOLPIDEM TARTRATE 10 MG PO TABS: ORAL_TABLET | ORAL | 1 refills | Status: DC

## 2023-04-18 NOTE — Patient Instructions (Signed)
Mammogram in June, ok for screening.

## 2023-04-18 NOTE — Progress Notes (Signed)
Established Patient Office Visit  Subjective   Patient ID: Kayla Hood, female    DOB: [birth year]  Age: 57 y.o. MRN: [id number]  Chief Complaint  Patient presents with   Follow-up    HPI Pt is a 57 yo female in remission with breast cancer, insomnia, GAD, Depression who presents to the clinic for follow up and medication refills.   She continues to take  ambien daily. She is not having any problems or concerns.   Her mood is good. She is walking daily. She is sleeping well. Denies any SI/HC.  Marland Kitchen. 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   Hidradenitis suppurativa 12/22/2015   Hot flashes due to surgical menopause 12/22/2015   Dyslipidemia (high LDL; low HDL) 12/22/2015   Essential hypertension, benign 12/22/2015   Vaginal dryness, menopausal 12/22/2015   Anxiety 12/22/2015   Nodule of finger of both hands 08/18/2016   Abnormal weight gain 11/18/2016   Diverticulitis of large intestine without perforation or abscess 01/23/2017   Major depression, recurrent, chronic 05/09/2017   Numbness of left hand 09/03/2017   Family history of early CAD 03/04/2018   Primary insomnia 03/27/2018   Overweight (BMI 25.0-29.9) 03/27/2018   Degenerative disc disease, lumbar 05/16/2018   Pre-diabetes 10/30/2018   Primary osteoarthritis of left hip 05/08/2019   Gallstones 12/31/2019   Erythematous papules of skin 03/30/2020   Cervical radiculopathy 12/19/2017   Left-sided chest pain 01/25/2021   PVC (premature ventricular contraction) 01/25/2021   Chicken pox    Incontinence of urine    OCD (obsessive compulsive disorder) 12/14/2017   Chest tightness or pressure 02/24/2018   Hand pain 02/07/2021   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   Mixed dyslipidemia 02/08/2021   Herpes simplex 03/09/2021   Malignant neoplasm of skin 03/09/2021   Migraine headache 03/09/2021   Sciatica  03/09/2021   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   OSA (obstructive sleep apnea) 06/15/2021   S/P mastectomy, right 08/22/2021   Fatigue 08/23/2021   Adjustment reaction with anxiety and depression 08/23/2021   Osteopenia of multiple sites 06/14/2021   Elevated blood pressure reading 10/28/2021   Breast pain, left 01/10/2022   Breast infection in female 01/10/2022   Malignant tumor of breast 07/01/2021   ETD (Eustachian tube dysfunction), right 02/15/2022   Trochanteric bursitis of left hip 06/30/2022   Hypertriglyceridemia 10/24/2022   Resolved Ambulatory Problems    Diagnosis Date Noted   Abnormal chest x-ray 03/03/2013   Irregular heart beat 03/03/2013   Sleep disturbance 03/03/2013   General medical exam 03/03/2013   Palpitations 03/21/2013   Left hip pain 06/28/2013   Tendonitis of shoulder 09/01/2013   Hives 01/26/2014   RLQ abdominal pain 01/26/2014   Facial edema 02/04/2014   Viral URI 03/09/2015   Anxiety state 06/07/2015   Depression with anxiety 07/08/2015   Exposure to blood or body fluid 11/24/2013   Screening for diabetes mellitus 12/22/2015   Ear itching 03/12/2017   Lateral epicondylitis, left elbow 07/23/2017   Class 1 obesity due to excess calories without serious comorbidity with body mass index (BMI) of 30.0 to 30.9 in adult 11/01/2018   Elevated LFTs 12/31/2019   Bruise of breast 03/29/2020   Fullness of breast 03/30/2020   History of left tennis elbow 07/23/2017   Elevated alkaline phosphatase level 01/24/2021   Allergy    Depression 12/14/2017   Hypertension  History of potentially hazardous body fluid exposure 11/24/2013   Open wound 11/24/2013   Left lateral epicondylitis 04/28/2020   Left elbow pain 04/28/2020   Shoulder pain 12/19/2017   Obesity (BMI 30-39.9) 02/08/2021   Coccydynia 03/09/2021   Low back pain 03/09/2021   Need for prophylactic vaccination and inoculation against influenza 03/09/2021   Other  dysfunctions of sleep stages or arousal from sleep 03/09/2021   Premenopausal menorrhagia 03/09/2021   Anxiety disorder due to medical condition 03/09/2021   Mid back pain 10/28/2021   No Additional Past Medical History     ROS See HPI.    Objective:     BP 134/86   Pulse 65   Ht 5\' 8"  (1.727 m)   Wt 157 lb (71.2 kg)   SpO2 99%   BMI 23.87 kg/m  BP Readings from Last 3 Encounters:  04/18/23 134/86  01/18/23 138/80  10/16/22 (!) 164/96   Wt Readings from Last 3 Encounters:  04/18/23 157 lb (71.2 kg)  01/18/23 159 lb 1.9 oz (72.2 kg)  10/16/22 162 lb (73.5 kg)  .Marland Kitchen    10/16/2022    2:43 PM 03/29/2020    9:59 AM 08/13/2019    2:16 PM 11/19/2018    9:21 AM 03/27/2018    9:28 AM  Depression screen PHQ 2/9  Decreased Interest 0 0 0  0  Down, Depressed, Hopeless 0 0 0 0 0  PHQ - 2 Score 0 0 0 0 0  Altered sleeping 0 0 1 0   Tired, decreased energy 0 0 1 0   Change in appetite 0 0 0 0   Feeling bad or failure about yourself  0 0 0 0   Trouble concentrating 0 0 0 0   Moving slowly or fidgety/restless 0 0 0 0   Suicidal thoughts 0 0 0 0   PHQ-9 Score 0 0 2 0   Difficult doing work/chores Not difficult at all Not difficult at all Not difficult at all Not difficult at all    .Marland Kitchen    10/16/2022    2:43 PM 03/29/2020    9:59 AM 08/13/2019    2:16 PM 11/19/2018    9:22 AM  GAD 7 : Generalized Anxiety Score  Nervous, Anxious, on Edge 0 0 1 0  Control/stop worrying 0 0 0 0  Worry too much - different things 0 0 0 0  Trouble relaxing 0 0 0 0  Restless 0 0 0 0  Easily annoyed or irritable 0 0 1 0  Afraid - awful might happen 0 0 0 0  Total GAD 7 Score 0 0 2 0  Anxiety Difficulty Not difficult at all Not difficult at all Not difficult at all Not difficult at all        Physical Exam Constitutional:      Appearance: Normal appearance.  HENT:     Head: Normocephalic.  Cardiovascular:     Rate and Rhythm: Normal rate and regular rhythm.  Pulmonary:     Effort:  Pulmonary effort is normal.     Breath sounds: Normal breath sounds.  Musculoskeletal:     Right lower leg: No edema.     Left lower leg: No edema.  Neurological:     Mental Status: She is alert and oriented to person, place, and time.  Psychiatric:        Mood and Affect: Mood normal.       The 10-year ASCVD risk score (Arnett DK, et al., 2019)  is: 2.8%    Assessment & Plan:  Marland KitchenMarland KitchenJacquelynne was seen today for follow-up.  Diagnoses and all orders for this visit:  Major depression, recurrent, chronic  Primary insomnia -     zolpidem (AMBIEN) 10 MG tablet; TAKE 1 TABLET(10 MG) BY MOUTH AT BEDTIME AS NEEDED FOR SLEEP  Anxiety  S/P mastectomy, right -     MM 3D SCREENING MAMMOGRAM BILATERAL BREAST  Ductal carcinoma in situ (DCIS) of right breast -     MM 3D SCREENING MAMMOGRAM BILATERAL BREAST  Essential hypertension, benign -     amLODipine (NORVASC) 5 MG tablet; Take 1 tablet (5 mg total) by mouth daily.   PHQ/GAD look GREAT Continue lexapro Ambien refilled for sleep she is only taking  nightly BC in remission, screening mammogram ordered for after June 21st/2024 BP to goal Continue on norvasc Keep exercising, weight looks great  Return in about 6 months (around 10/18/2023).    Tandy Gaw, PA-C

## 2023-04-18 NOTE — Progress Notes (Signed)
j

## 2023-06-27 ENCOUNTER — Ambulatory Visit
Admission: RE | Admit: 2023-06-27 | Discharge: 2023-06-27 | Disposition: A | Discharge status: Home / Self Care | Payer: BC Managed Care – PPO | Source: Ambulatory Visit | Attending: Family Medicine | Admitting: Family Medicine

## 2023-06-27 ENCOUNTER — Ambulatory Visit (INDEPENDENT_AMBULATORY_CARE_PROVIDER_SITE_OTHER): Payer: BC Managed Care – PPO

## 2023-06-27 VITALS — BP 167/89 | HR 72 | Temp 98.8°F | Resp 16

## 2023-06-27 DIAGNOSIS — R300 Dysuria: Secondary | ICD-10-CM

## 2023-06-27 DIAGNOSIS — Z9011 Acquired absence of right breast and nipple: Secondary | ICD-10-CM | POA: Diagnosis not present

## 2023-06-27 DIAGNOSIS — D0511 Intraductal carcinoma in situ of right breast: Secondary | ICD-10-CM

## 2023-06-27 LAB — POCT URINALYSIS DIP (MANUAL ENTRY)
Bilirubin, UA: NEGATIVE
Blood, UA: NEGATIVE
Glucose, UA: NEGATIVE mg/dL
Ketones, POC UA: NEGATIVE mg/dL
Leukocytes, UA: NEGATIVE
Nitrite, UA: NEGATIVE
Protein Ur, POC: NEGATIVE mg/dL
Spec Grav, UA: 1.015 (ref 1.010–1.025)
Urobilinogen, UA: 0.2 E.U./dL
pH, UA: 6 (ref 5.0–8.0)

## 2023-06-27 MED ORDER — SULFAMETHOXAZOLE-TRIMETHOPRIM 800-160 MG PO TABS
1.0000 | ORAL_TABLET | Freq: Two times a day (BID) | ORAL | 0 refills | Status: AC
Start: 2023-06-27 — End: 2023-06-30

## 2023-06-27 NOTE — Discharge Instructions (Addendum)
Advised patient to take medication as directed with food to completion.  Encouraged to increase daily water intake to 64 ounces per day while taking this medication.  Advised we will follow-up with urine culture results once received.

## 2023-06-27 NOTE — ED Provider Notes (Signed)
[name]    CSN: [id number] Arrival date & time: 06/27/23  1059      History   Chief Complaint Chief Complaint  Patient presents with   Dysuria    HPI Kayla Hood is a 57 y.o. female.   HPI 57 year old female presents with dysuria for 2 days.  Patient is recurrent tunnel with possible UTI.  PMH significant for adjustment reaction with anxiety and depression, HTN, and migraine.  Past Medical History:  Diagnosis Date   Abnormal chest x-ray 03/03/2013   Patient brings in her report of xray done in 10/2012 and 11/2012 which showed persistent abnormality in the left perihilar and infrahilar regions posteriorly. A follow up chest xray was recommended.  Formatting of this note might be different from the original. Formatting of this note might be different from the original. 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  F/u in 2-3 weeks.  Pt offered psych consult but declines.   Anxiety state 06/07/2015   Breast infection in female 01/10/2022   Breast pain, left 01/10/2022   Bruise of breast 03/29/2020   Cervical radiculopathy 12/19/2017   Chest tightness or pressure 02/24/2018   Chicken pox    Class 1 obesity due to excess calories without serious comorbidity with body mass index (BMI) of 30.0 to 30.9 in adult 11/01/2018   Coccydynia 03/09/2021   no trauma, on exam slightly prominent and recent weight loss probably increased pressure to site0 motrin q 8 x 2 weeks then prn - donut seat and activity modification. F/U prn- she declines rx - no pain   Degenerative disc disease, lumbar 05/16/2018   Formatting of this note might be different from  the original. Last Assessment & Plan:  Formatting of this note might be different from the original. Acute mid low back pain. Likely discogenic, started conservatively, prednisone, Robaxin, formal PT. Thoracic and lumbar spine x-rays as the pain is near the thoracolumbar junction. She does have significant pain out of proportion to degree of palpatio   Depression    Depression with anxiety 07/08/2015   PHQ-9 was 3. GAD-7 was 1. 06/2016.   Formatting of this note might be different from the original. Overview:  PHQ-9 was 3. GAD-7 was 1. 06/2016.   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    Ear itching 03/12/2017   Elevated alkaline phosphatase level 01/24/2021   Elevated blood pressure reading 10/28/2021   Elevated LFTs 12/31/2019   Endometriosis 07/08/2015   Erythematous papules of skin 03/30/2020   Essential hypertension, benign 12/22/2015   ETD (Eustachian tube dysfunction), right 02/15/2022   Exposure to blood or body fluid 11/24/2013   Last Assessment & Plan:  Suspect low risk exposure but post exposure labs pend for reassurance and repeat these in 3-6 months.   Formatting of this note might be different from the original. Formatting of this note might be different  from the original. Last Assessment & Plan:  Suspect low risk exposure but post exposure labs pend for reassurance and repeat these in 3-6 months.   Facial edema 02/04/2014   Family history of early CAD 03/04/2018   Fatigue 08/23/2021   Fibroids 07/08/2015   Fullness of breast 03/30/2020   Gallstones 12/31/2019   General medical exam 03/03/2013   Formatting of this note might be different from the original. Last Assessment & Plan:  Formatting of this note might be different from the  original. Normal physical exam except for problems listed below. Labs ordered: cbc w/diff, cmet, lipid, TSH, vit D level (hx of deficiency). Also ordered EKG, chest xray to follow up on previously noted abnormality. Cardiology evaluation.   Hand pain 02/07/2021   Herpes simplex 03/09/2021   Discussed Tx. options. She would like to have valtrex, but would like to get it from a pharmacy outside this [facility].  Written Rx for Valtrex 1000mg  2 tabs bid x 1 day given. #4 with 3 RF.  Advised her to take her medication today and then refill the Rx to ha   Hidradenitis suppurativa 12/22/2015   History of left tennis elbow 07/23/2017   Formatting of this note might be different from the original. Last Assessment & Plan:  Failure of conservative measures, ultrasound guided injection as above.   History of potentially hazardous body fluid exposure 11/24/2013   Last Assessment & Plan:  Formatting of this note might be different from the original. Suspect low risk exposure but post exposure labs pend for reassurance and repeat these in 3-6 months. Formatting of this note might be different from the original. Last Assessment & Plan:  Formatting of this note might be different from the original. Suspect low risk exposure but post exposure labs pend for reas   Hives 01/26/2014   Formatting of this note might be different from the original. Last Assessment & Plan:  Formatting of this note might be different from the original. Uncertain etiology. Start Zyrtec nightly. If no improvement in 2 weeks will refer to Allergist.   Hot flashes due to surgical menopause 12/22/2015   Hypertension    Incontinence of urine    Irregular heart beat 03/03/2013   Formatting of this note might be different from the original. Last Assessment & Plan:  Patient feeling skipped beats which are increasing in frequency. Associated with feeling lightheaded, fatigued. Will check metabolic panel, TSH. Patient also reports that her husband says she  snores. I am ordering a sleep study to r/o OSA which may cause irregular heart beat. EKG was done and shows a minimal ST    Lateral epicondylitis, left elbow 07/23/2017   Left elbow pain 04/28/2020   Formatting of this note might be different from the original. Added automatically from request for surgery [id number]   Left hip pain 06/28/2013   Formatting of this note might be different from the original. Last Assessment & Plan:  Suspect this is tendinitis involving the iliopsoas tendon. Send for left hip x-ray to rule out any structural abnormalities. Start anti-inflammatories such as ibuprofen 600 mg every 6 hours for the next 3-4 days, ice for 15 minutes 3-4 times a day for the next 3-4 days and rest. Once inflammation subsides recomm   Left lateral epicondylitis 04/28/2020   Formatting of this note might be different from the original. Added automatically from request for surgery [id number]   Left-sided chest pain 01/25/2021   Low back pain 03/09/2021   Major depression, recurrent,  chronic (HCC) 05/09/2017   Malignant neoplasm of skin 03/09/2021   Placed consult for removal at [facility] clinic   Malignant tumor of breast (HCC) 07/01/2021   Mid back pain 10/28/2021   Migraine headache 03/09/2021   Pt with hx of migraine ha needs refill of meds today   Mixed dyslipidemia 02/08/2021   Need for prophylactic vaccination and inoculation against influenza 03/09/2021   Nodule of finger of both hands 08/18/2016   Numbness of left hand 09/03/2017   Formatting of this note might be different from the original. Last Assessment & Plan:  C8 distribution versus ulnar nerve. She does have a history of a multilevel cervical fusion. Symptoms have been present for a long time now, it sounds as her neurosurgeon was planning a repeat MRI. I'm going to order a nerve conduction study, repeat x-rays, depending on what we see I may order an MRI with and wi   Obesity (BMI 30-39.9) 02/08/2021   OCD (obsessive compulsive disorder)  12/14/2017   Open wound 11/24/2013   Formatting of this note might be different from the original. Last Assessment & Plan:  Formatting of this note might be different from the original. Keep clean and observe for sx of infection. Rx for Augmentin to begin prn infection. FU for any wound problems. Last Assessment & Plan:  Formatting of this note might be different from the original. Keep clean and observe for sx of infection. Rx for A   OSA (obstructive sleep apnea) 06/15/2021   On CPAP. 05/2021. Managed by neurology.    Osteopenia of multiple sites 06/14/2021   Other dysfunctions of sleep stages or arousal from sleep 03/09/2021   Pt offered Lunest or change to non abuse med.  Pt declines.  Will write for in house meds as noted.  Pt to f/u. PRN   Overweight (BMI 25.0-29.9) 03/27/2018   Palpitations 03/21/2013   Formatting of this note might be different from the original. Last Assessment & Plan:  Pt has seen cardiology in past She was put on BB but it made her too tired She was given norvasc for bp but has not started it yet Probably related to recent increase in stress and anxiety   Pre-diabetes 10/30/2018   Premenopausal menorrhagia 03/09/2021   HOT flashes- was on OCp prior- she dc'd has appt c GYN provider in a few weeks - also recently dc'd her zoloft- should restart zoloft for vasomotor sx will given ambien prn sleep difficulties see below - also pt to keep appt c GYN provider do not suggest altering hormones by me today as she is managed by specialist - discussed calcium also see below- avoid spicy foods and caffeine   Primary insomnia 03/27/2018   Primary osteoarthritis of left hip 05/08/2019   Formatting of this note might be different from the original. Last Assessment & Plan:  Formatting of this note might be different from the original. Shanovia has 2 pain generators, the hip joint on the greater trochanteric bursa. We injected the left hip joint at the last visit, and her groin pain has  completely resolved. She still has some lateral pain referrable to the bursa, hip abductors are weak   PVC (premature ventricular contraction) 01/25/2021   RLQ abdominal pain 01/26/2014   Formatting of this note might be different from the original. Last Assessment & Plan:  RLQ tenderness with palpation and frequent nausea - concerning for appendicitis. Also, her report of symptoms point toward gallbladder. Recommend CT of abdomen/pelvis. Check cbc and  cmet. She wanted to wait to have CT done on Wednesday. Instructed to call if symptoms worsen.   S/P mastectomy, right 08/22/2021   Sciatica 03/09/2021   referral to ortho; lumbar x-ray pending   Screening for diabetes mellitus 12/22/2015   Shoulder pain 12/19/2017   Sleep disturbance 03/03/2013   Formatting of this note might be different from the original. Last Assessment & Plan:  Formatting of this note might be different from the original. Re-signed [facility] since I could not find the one on file. She has been on Ambien for years and it is helpful 5 mg a night (halves her 10 mg). Will continue this.   Tendonitis of shoulder 09/01/2013   Vaginal dryness, menopausal 12/22/2015    Patient Active Problem List   Diagnosis Date Noted   Hypertriglyceridemia 10/24/2022   Trochanteric bursitis of left hip 06/30/2022   ETD (Eustachian tube dysfunction), right 02/15/2022   Breast pain, left 01/10/2022   Breast infection in female 01/10/2022   Elevated blood pressure reading 10/28/2021   Fatigue 08/23/2021   Adjustment reaction with anxiety and depression 08/23/2021   S/P mastectomy, right 08/22/2021   Malignant tumor of breast (HCC) 07/01/2021   OSA (obstructive sleep apnea) 06/15/2021   Osteopenia of multiple sites 06/14/2021   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   Herpes simplex 03/09/2021   Malignant neoplasm of skin 03/09/2021   Migraine headache 03/09/2021   Sciatica 03/09/2021   Mixed dyslipidemia 02/08/2021   Hand  pain 02/07/2021   Chicken pox    Incontinence of urine    Left-sided chest pain 01/25/2021   PVC (premature ventricular contraction) 01/25/2021   Erythematous papules of skin 03/30/2020   Gallstones 12/31/2019   Primary osteoarthritis of left hip 05/08/2019   Pre-diabetes 10/30/2018   Degenerative disc disease, lumbar 05/16/2018   Primary insomnia 03/27/2018   Overweight (BMI 25.0-29.9) 03/27/2018   Family history of early CAD 03/04/2018   Chest tightness or pressure 02/24/2018   Cervical radiculopathy 12/19/2017   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   OCD (obsessive compulsive disorder) 12/14/2017   Numbness of left hand 09/03/2017   Major depression, recurrent, chronic (HCC) 05/09/2017   Diverticulitis of large intestine without perforation or abscess 01/23/2017   Abnormal weight gain 11/18/2016   Nodule of finger of both hands 08/18/2016   Hidradenitis suppurativa 12/22/2015   Hot flashes due to surgical menopause 12/22/2015   Dyslipidemia (high LDL; low HDL) 12/22/2015   Essential hypertension, benign 12/22/2015   Vaginal dryness, menopausal 12/22/2015   Anxiety 12/22/2015   Endometriosis 07/08/2015   Fibroids 07/08/2015   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 Authorizing Provider  sulfamethoxazole-trimethoprim (BACTRIM DS) 800-160 MG tablet Take 1 tablet by mouth 2 (two) times daily for 3 days. 06/27/23 06/30/23 Yes Trevor Iha, FNP  amLODipine (NORVASC) 5 MG tablet Take 1 tablet (5 mg  total) by mouth daily. 04/18/23   Breeback, Jade L, PA-C  escitalopram (LEXAPRO) 10 MG tablet Take 1 tablet (10 mg total) by mouth daily. 10/16/22   Breeback, Jade L, PA-C  zolpidem (AMBIEN) 10 MG tablet TAKE 1 TABLET(10 MG) BY MOUTH AT BEDTIME AS NEEDED FOR SLEEP 04/18/23   Jomarie Longs, PA-C    Family History Family History  Problem Relation Age of Onset   Hyperlipidemia Mother    Ulcerative colitis Mother    Heart disease Father 55       CAD   Hypertension Father    Diverticulitis Father    Hypertension Brother    Cancer Maternal Grandfather 3       Stomach Cancer   Cancer Paternal Grandmother 26       Uterine Cancer   Stroke Paternal Grandfather 74       CVA   Cancer Maternal Grandmother     Social History Social History   Tobacco Use   Smoking status: Never   Smokeless tobacco: Never  Vaping Use   Vaping Use: Never used  Substance Use Topics   Alcohol use: No    Alcohol/week: 2.0 standard drinks of alcohol    Types: 2 Cans of beer per week    Comment: Occasionally    Drug use: No     Allergies   Promethazine   Review of Systems Review of Systems   Physical Exam Triage Vital Signs ED Triage Vitals  Enc Vitals Group     BP 06/27/23 1137 (!) 167/89     Pulse Rate 06/27/23 1137 72     Resp 06/27/23 1137 16     Temp 06/27/23 1137 98.8 F (37.1 C)     Temp src --      SpO2 06/27/23 1137 98 %     Weight --      Height --      Head Circumference --      Peak Flow --      Pain Score 06/27/23 1136 1     Pain Loc --      Pain Edu? --      Excl. in GC? --    No data found.  Updated Vital Signs BP (!) 167/89   Pulse 72   Temp 98.8 F (37.1 C)   Resp 16   SpO2 98%   Visual Acuity Right Eye Distance:   Left Eye Distance:   Bilateral Distance:    Right Eye Near:   Left Eye Near:    Bilateral Near:     Physical Exam Vitals and nursing note reviewed.  Constitutional:      Appearance: Normal appearance. She is normal weight. She is not  ill-appearing.  HENT:     Head: Normocephalic and atraumatic.     Mouth/Throat:     Mouth: Mucous membranes are moist.     Pharynx: Oropharynx is clear.  Eyes:     Extraocular Movements: Extraocular movements intact.     Conjunctiva/sclera: Conjunctivae normal.     Pupils: Pupils are equal, round, and reactive to light.  Cardiovascular:     Rate and Rhythm: Normal rate and regular rhythm.     Pulses: Normal pulses.     Heart sounds: Normal heart  sounds.  Pulmonary:     Effort: Pulmonary effort is normal.     Breath sounds: Normal breath sounds. No wheezing, rhonchi or rales.  Abdominal:     Tenderness: There is no right CVA tenderness or left CVA tenderness.  Musculoskeletal:        General: Normal range of motion.     Cervical back: Normal range of motion and neck supple.  Skin:    General: Skin is warm and dry.  Neurological:     General: No focal deficit present.     Mental Status: She is alert and oriented to person, place, and time. Mental status is at baseline.  Psychiatric:        Mood and Affect: Mood normal.        Behavior: Behavior normal.      UC Treatments / Results  Labs (all labs ordered are listed, but only abnormal results are displayed) Labs Reviewed  URINE CULTURE  POCT URINALYSIS DIP (MANUAL ENTRY)    EKG   Radiology No results found.  Procedures Procedures (including critical care time)  Medications Ordered in UC Medications - No data to display  Initial Impression / Assessment and Plan / UC Course  I have reviewed the triage vital signs and the nursing notes.  Pertinent labs & imaging results that were available during my care of the patient were reviewed by me and considered in my medical decision making (see chart for details).     MDM: 1.  Dysuria-UA revealed above, urine culture ordered, Rx'd Bactrim DS 800/160 mg tablet twice daily x 3 days. Advised patient to take medication as directed with food to completion.  Encouraged to  increase daily water intake to 64 ounces per day while taking this medication.  Advised we will follow-up with urine culture results once received.  Patient discharged home, hemodynamically stable.   Final Clinical Impressions(s) / UC Diagnoses   Final diagnoses:  Dysuria     Discharge Instructions      Advised patient to take medication as directed with food to completion.  Encouraged to increase daily water intake to 64 ounces per day while taking this medication.  Advised we will follow-up with urine culture results once received.     ED Prescriptions     Medication Sig Dispense Auth. Provider   sulfamethoxazole-trimethoprim (BACTRIM DS) 800-160 MG tablet Take 1 tablet by mouth 2 (two) times daily for 3 days. 6 tablet Trevor Iha, FNP      PDMP not reviewed this encounter.   Trevor Iha, FNP 06/27/23 1250

## 2023-06-27 NOTE — ED Triage Notes (Signed)
Pt presents with co of dysuria for 2 days. Pt concerned for uti. Pt reports no otc meds

## 2023-06-28 LAB — URINE CULTURE: Culture: <10000 — AB

## 2023-06-29 NOTE — Progress Notes (Signed)
Please call patient. Normal mammogram.  Repeat in 1 year.  

## 2023-07-19 ENCOUNTER — Ambulatory Visit: Payer: BC Managed Care – PPO | Admitting: Hematology & Oncology

## 2023-07-19 ENCOUNTER — Inpatient Hospital Stay: Payer: BC Managed Care – PPO

## 2023-10-16 ENCOUNTER — Ambulatory Visit: Admitting: Physician Assistant

## 2023-10-16 DIAGNOSIS — F5101 Primary insomnia: Secondary | ICD-10-CM

## 2023-10-16 MED ORDER — ZOLPIDEM TARTRATE 10 MG PO TABS: ORAL_TABLET | ORAL | 1 refills | Status: DC

## 2023-10-16 NOTE — Progress Notes (Signed)
Established Patient Office Visit  Subjective   Patient ID: Kayla Hood, female    DOB: 07-27-1966  Age: 57 y.o. MRN: [id number]  Chief Complaint  Patient presents with   Medical Management of Chronic Issues    Medication refill    HPI  Kayla Hood is a 57 year old female in remission of breast cancer, insomnia, GAD, depression who presents for a follow up and medication refill.   She states her mood has been good and has no complaints. She states that she continues to use the Ambien for sleep. She has tried to go off but doesn't get restorative sleep without it. She is exercising regularly.  She states her blood pressure at home is lower than in office today. Denies any CP/SOB/palpitations.   ROS See HPI   Objective:     BP (!) 149/95   Pulse 87   Ht 5\' 8"  (1.727 m)   Wt 75.3 kg   SpO2 99%   BMI 25.24 kg/m  BP Readings from Last 3 Encounters:  10/16/23 (!) 149/95  06/27/23 (!) 167/89  04/18/23 134/86   Wt Readings from Last 3 Encounters:  10/16/23 75.3 kg  04/18/23 71.2 kg  01/18/23 72.2 kg      Physical Exam Constitutional:      Appearance: Normal appearance.  HENT:     Head: Normocephalic and atraumatic.  Cardiovascular:     Rate and Rhythm: Normal rate and regular rhythm.     Pulses: Normal pulses.     Heart sounds: Normal heart sounds.  Pulmonary:     Effort: Pulmonary effort is normal.     Breath sounds: Normal breath sounds.  Neurological:     General: No focal deficit present.     Mental Status: She is alert and oriented to person, place, and time.  Psychiatric:        Mood and Affect: Mood normal.        Behavior: Behavior normal.       10/16/2022    2:43 PM 03/29/2020    9:59 AM 08/13/2019    2:16 PM 11/19/2018    9:21 AM 03/27/2018    9:28 AM  Depression screen PHQ 2/9  Decreased Interest 0 0 0  0  Down, Depressed, Hopeless 0 0 0 0 0  PHQ - 2 Score 0 0 0 0 0  Altered sleeping 0 0 1 0   Tired, decreased energy 0 0 1 0   Change in  appetite 0 0 0 0   Feeling bad or failure about yourself  0 0 0 0   Trouble concentrating 0 0 0 0   Moving slowly or fidgety/restless 0 0 0 0   Suicidal thoughts 0 0 0 0   PHQ-9 Score 0 0 2 0   Difficult doing work/chores Not difficult at all Not difficult at all Not difficult at all Not difficult at all    .Marland Kitchen    10/16/2022    2:43 PM 03/29/2020    9:59 AM 08/13/2019    2:16 PM 11/19/2018    9:22 AM  GAD 7 : Generalized Anxiety Score  Nervous, Anxious, on Edge 0 0 1 0  Control/stop worrying 0 0 0 0  Worry too much - different things 0 0 0 0  Trouble relaxing 0 0 0 0  Restless 0 0 0 0  Easily annoyed or irritable 0 0 1 0  Afraid - awful might happen 0 0 0 0  Total GAD  7 Score 0 0 2 0  Anxiety Difficulty Not difficult at all Not difficult at all Not difficult at all Not difficult at all     The 10-year ASCVD risk score (Arnett DK, et al., 2019) is: 3.5%    Assessment & Plan:  .Kayla Hood was seen today for medical management of chronic issues.  Diagnoses and all orders for this visit:  Primary insomnia -     zolpidem (AMBIEN) 10 MG tablet; TAKE 1 TABLET(10 MG) BY MOUTH AT BEDTIME AS NEEDED FOR SLEEP    Problem List Items Addressed This Visit       Other   Primary insomnia   Relevant Medications   zolpidem (AMBIEN) 10 MG tablet   BP elevated at 149/95 today. Recheck BP still elevated. Patient states at home she is getting much lower BP. No changes to medications.   Continue Ambien for sleep. Patient has tried to go off of it but doesn't get restorative sleep without it. Refill given.  Patient mood is good today. No complaints. PHQ/GAD score great. Continue Lexapro.  Get routine labs at next visit.   Return in about 6 months (around 04/15/2024).    Brooke D McNees, Student-PA

## 2023-10-17 ENCOUNTER — Encounter: Payer: Self-pay | Admitting: Physician Assistant

## 2023-12-03 ENCOUNTER — Encounter: Payer: Self-pay | Admitting: Physician Assistant

## 2023-12-03 DIAGNOSIS — F419 Anxiety disorder, unspecified: Secondary | ICD-10-CM

## 2023-12-03 DIAGNOSIS — F339 Major depressive disorder, recurrent, unspecified: Secondary | ICD-10-CM

## 2023-12-03 MED ORDER — ESCITALOPRAM OXALATE 10 MG PO TABS: 10.0000 mg | ORAL_TABLET | Freq: Every day | ORAL | 1 refills | Status: DC

## 2024-03-10 ENCOUNTER — Encounter: Payer: Self-pay | Admitting: Physician Assistant

## 2024-03-10 ENCOUNTER — Ambulatory Visit (INDEPENDENT_AMBULATORY_CARE_PROVIDER_SITE_OTHER): Admitting: Physician Assistant

## 2024-03-10 ENCOUNTER — Ambulatory Visit

## 2024-03-10 DIAGNOSIS — I10 Essential (primary) hypertension: Secondary | ICD-10-CM

## 2024-03-10 DIAGNOSIS — S62629A Displaced fracture of medial phalanx of unspecified finger, initial encounter for closed fracture: Secondary | ICD-10-CM | POA: Insufficient documentation

## 2024-03-10 DIAGNOSIS — S62629D Displaced fracture of medial phalanx of unspecified finger, subsequent encounter for fracture with routine healing: Secondary | ICD-10-CM | POA: Diagnosis not present

## 2024-03-10 DIAGNOSIS — M542 Cervicalgia: Secondary | ICD-10-CM | POA: Diagnosis not present

## 2024-03-10 DIAGNOSIS — S52611A Displaced fracture of right ulna styloid process, initial encounter for closed fracture: Secondary | ICD-10-CM | POA: Insufficient documentation

## 2024-03-10 DIAGNOSIS — M25561 Pain in right knee: Secondary | ICD-10-CM

## 2024-03-10 DIAGNOSIS — S52614D Nondisplaced fracture of right ulna styloid process, subsequent encounter for closed fracture with routine healing: Secondary | ICD-10-CM

## 2024-03-10 DIAGNOSIS — S52616D Nondisplaced fracture of unspecified ulna styloid process, subsequent encounter for closed fracture with routine healing: Secondary | ICD-10-CM | POA: Insufficient documentation

## 2024-03-10 NOTE — Progress Notes (Unsigned)
   Established Patient Office Visit  Subjective   Patient ID: Kayla Hood, female    DOB: 1966/06/19  Age: 58 y.o. MRN: [id number]  No chief complaint on file.   HPI 3/14  Neck not scaned mandible  Motocycle rolling back   {History (Optional):23778}  ROS    Objective:     There were no vitals taken for this visit. {Vitals History (Optional):23777}  Physical Exam   No results found for any visits on 03/10/24.  {Labs (Optional):23779}  The 10-year ASCVD risk score (Arnett DK, et al., 2019) is: 3.7%    Assessment & Plan:   Problem List Items Addressed This Visit   None   No follow-ups on file.    Tandy Gaw, PA-C

## 2024-03-10 NOTE — Progress Notes (Signed)
 No fracture of neck. GREAT news.

## 2024-03-10 NOTE — Progress Notes (Signed)
 GREAT news. No fracture seen.

## 2024-03-10 NOTE — Patient Instructions (Addendum)
 Get Xrays(neck and knee) ICE ICE ICE Continue ibuprofen 800mg  up to 3 times a day  Follow up with Dr. Karie Schwalbe next week unless called sooner

## 2024-03-11 ENCOUNTER — Inpatient Hospital Stay: Admitting: Sports Medicine

## 2024-03-11 DIAGNOSIS — M25561 Pain in right knee: Secondary | ICD-10-CM | POA: Insufficient documentation

## 2024-03-14 ENCOUNTER — Ambulatory Visit (INDEPENDENT_AMBULATORY_CARE_PROVIDER_SITE_OTHER): Admitting: Sports Medicine

## 2024-03-14 ENCOUNTER — Ambulatory Visit

## 2024-03-14 ENCOUNTER — Ambulatory Visit (HOSPITAL_BASED_OUTPATIENT_CLINIC_OR_DEPARTMENT_OTHER)
Admission: RE | Admit: 2024-03-14 | Discharge: 2024-03-14 | Disposition: A | Discharge status: Home / Self Care | Source: Ambulatory Visit | Attending: Sports Medicine | Admitting: Sports Medicine

## 2024-03-14 ENCOUNTER — Encounter: Payer: Self-pay | Admitting: Sports Medicine

## 2024-03-14 VITALS — BP 170/85 | HR 90 | Resp 20 | Ht 68.0 in

## 2024-03-14 DIAGNOSIS — S62650A Nondisplaced fracture of medial phalanx of right index finger, initial encounter for closed fracture: Secondary | ICD-10-CM

## 2024-03-14 DIAGNOSIS — S62620A Displaced fracture of medial phalanx of right index finger, initial encounter for closed fracture: Secondary | ICD-10-CM | POA: Insufficient documentation

## 2024-03-14 DIAGNOSIS — M25561 Pain in right knee: Secondary | ICD-10-CM | POA: Diagnosis present

## 2024-03-14 DIAGNOSIS — S52614A Nondisplaced fracture of right ulna styloid process, initial encounter for closed fracture: Secondary | ICD-10-CM | POA: Diagnosis not present

## 2024-03-14 MED ORDER — HYDROCODONE-ACETAMINOPHEN 10-325 MG PO TABS: 1.0000 | ORAL_TABLET | Freq: Three times a day (TID) | ORAL | 0 refills | Status: DC | PRN
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
## 2024-03-14 NOTE — Assessment & Plan Note (Signed)
 Noted on x-rays in the ED, she has only minimal tenderness to palpation here, she does have some swelling. I would like updated x-rays today. We will continue her Exos cast and I will buddy tape the second and third fingers together.
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
## 2024-04-04 ENCOUNTER — Ambulatory Visit: Admitting: Sports Medicine

## 2024-04-04 DIAGNOSIS — M5412 Radiculopathy, cervical region: Secondary | ICD-10-CM

## 2024-04-04 DIAGNOSIS — M25561 Pain in right knee: Secondary | ICD-10-CM

## 2024-04-04 DIAGNOSIS — G8929 Other chronic pain: Secondary | ICD-10-CM

## 2024-04-04 DIAGNOSIS — S62650D Nondisplaced fracture of medial phalanx of right index finger, subsequent encounter for fracture with routine healing: Secondary | ICD-10-CM | POA: Diagnosis not present

## 2024-04-04 NOTE — Assessment & Plan Note (Signed)
 Still with some discomfort and swelling right index finger PIP. Pain is more on the ulnar aspect and not so much at the volar plate anymore. I would like to add some home physical therapy.
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
## 2024-04-08 ENCOUNTER — Other Ambulatory Visit

## 2024-04-22 ENCOUNTER — Ambulatory Visit

## 2024-04-22 DIAGNOSIS — M5412 Radiculopathy, cervical region: Secondary | ICD-10-CM

## 2024-04-22 DIAGNOSIS — M25561 Pain in right knee: Secondary | ICD-10-CM

## 2024-04-22 DIAGNOSIS — G8929 Other chronic pain: Secondary | ICD-10-CM

## 2024-04-22 DIAGNOSIS — M542 Cervicalgia: Secondary | ICD-10-CM

## 2024-05-01 ENCOUNTER — Telehealth: Payer: Self-pay

## 2024-05-01 ENCOUNTER — Encounter: Payer: Self-pay | Admitting: Sports Medicine

## 2024-05-01 NOTE — Telephone Encounter (Signed)
 Called radiology reading room and spoke with stcy to get this pt's imaging read sooner.

## 2024-05-01 NOTE — Telephone Encounter (Signed)
 Please call radiology to have this moved to the front of the line/changed to stat

## 2024-05-01 NOTE — Telephone Encounter (Signed)
 Called radiology reading room to have images expedited

## 2024-05-01 NOTE — Telephone Encounter (Deleted)

## 2024-05-13 ENCOUNTER — Encounter: Payer: Self-pay | Admitting: Physician Assistant

## 2024-05-13 ENCOUNTER — Ambulatory Visit (INDEPENDENT_AMBULATORY_CARE_PROVIDER_SITE_OTHER): Admitting: Physician Assistant

## 2024-05-13 VITALS — BP 130/82 | HR 78 | Ht 68.0 in | Wt 174.0 lb

## 2024-05-13 DIAGNOSIS — F5101 Primary insomnia: Secondary | ICD-10-CM | POA: Diagnosis not present

## 2024-05-13 DIAGNOSIS — R7303 Prediabetes: Secondary | ICD-10-CM

## 2024-05-13 DIAGNOSIS — R635 Abnormal weight gain: Secondary | ICD-10-CM

## 2024-05-13 DIAGNOSIS — I10 Essential (primary) hypertension: Secondary | ICD-10-CM | POA: Diagnosis not present

## 2024-05-13 DIAGNOSIS — E785 Hyperlipidemia, unspecified: Secondary | ICD-10-CM

## 2024-05-13 DIAGNOSIS — Z23 Encounter for immunization: Secondary | ICD-10-CM

## 2024-05-13 DIAGNOSIS — R5383 Other fatigue: Secondary | ICD-10-CM

## 2024-05-13 DIAGNOSIS — E663 Overweight: Secondary | ICD-10-CM

## 2024-05-13 DIAGNOSIS — Z780 Asymptomatic menopausal state: Secondary | ICD-10-CM

## 2024-05-13 DIAGNOSIS — E559 Vitamin D deficiency, unspecified: Secondary | ICD-10-CM

## 2024-05-13 MED ORDER — ZOLPIDEM TARTRATE 10 MG PO TABS: ORAL_TABLET | ORAL | 1 refills | Status: DC

## 2024-05-13 MED ORDER — AMLODIPINE BESYLATE 5 MG PO TABS: 5.0000 mg | ORAL_TABLET | Freq: Every day | ORAL | 1 refills | Status: DC
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
## 2024-05-14 LAB — CBC WITH DIFFERENTIAL/PLATELET
Basophils Absolute: 0.1 10*3/uL (ref 0.0–0.2)
Basos: 1 %
EOS (ABSOLUTE): 0.1 10*3/uL (ref 0.0–0.4)
Eos: 3 %
Hematocrit: 43.7 % (ref 34.0–46.6)
Hemoglobin: 14.3 g/dL (ref 11.1–15.9)
Immature Grans (Abs): 0 10*3/uL (ref 0.0–0.1)
Immature Granulocytes: 1 %
Lymphocytes Absolute: 1.5 10*3/uL (ref 0.7–3.1)
Lymphs: 33 %
MCH: 30.1 pg (ref 26.6–33.0)
MCHC: 32.7 g/dL (ref 31.5–35.7)
MCV: 92 fL (ref 79–97)
Monocytes Absolute: 0.4 10*3/uL (ref 0.1–0.9)
Monocytes: 9 %
Neutrophils Absolute: 2.5 10*3/uL (ref 1.4–7.0)
Neutrophils: 53 %
Platelets: 318 10*3/uL (ref 150–450)
RBC: 4.75 x10E6/uL (ref 3.77–5.28)
RDW: 12.9 % (ref 11.7–15.4)
WBC: 4.6 10*3/uL (ref 3.4–10.8)

## 2024-05-14 LAB — CMP14+EGFR
ALT: 20 IU/L (ref 0–32)
AST: 20 IU/L (ref 0–40)
Albumin: 4.6 g/dL (ref 3.8–4.9)
Alkaline Phosphatase: 114 IU/L (ref 44–121)
BUN/Creatinine Ratio: 21 (ref 9–23)
BUN: 13 mg/dL (ref 6–24)
Bilirubin Total: 0.7 mg/dL (ref 0.0–1.2)
CO2: 21 mmol/L (ref 20–29)
Calcium: 9.9 mg/dL (ref 8.7–10.2)
Chloride: 103 mmol/L (ref 96–106)
Creatinine, Ser: 0.63 mg/dL (ref 0.57–1.00)
Globulin, Total: 2.3 g/dL (ref 1.5–4.5)
Glucose: 92 mg/dL (ref 70–99)
Potassium: 4.5 mmol/L (ref 3.5–5.2)
Sodium: 139 mmol/L (ref 134–144)
Total Protein: 6.9 g/dL (ref 6.0–8.5)
eGFR: 105 mL/min/{1.73_m2} (ref >59)

## 2024-05-14 LAB — IRON,TIBC AND FERRITIN PANEL
Ferritin: 109 ng/mL (ref 15–150)
Iron Saturation: 28 % (ref 15–55)
Iron: 94 ug/dL (ref 27–159)
Total Iron Binding Capacity: 335 ug/dL (ref 250–450)
UIBC: 241 ug/dL (ref 131–425)

## 2024-05-14 LAB — HEMOGLOBIN A1C
Est. average glucose Bld gHb Est-mCnc: 114 mg/dL
Hgb A1c MFr Bld: 5.6 % (ref 4.8–5.6)

## 2024-05-14 LAB — LIPID PANEL
Chol/HDL Ratio: 4.7 ratio — ABNORMAL HIGH (ref 0.0–4.4)
Cholesterol, Total: 270 mg/dL — ABNORMAL HIGH (ref 100–199)
HDL: 57 mg/dL (ref >39)
LDL Chol Calc (NIH): 171 mg/dL — ABNORMAL HIGH (ref 0–99)
Triglycerides: 227 mg/dL — ABNORMAL HIGH (ref 0–149)
VLDL Cholesterol Cal: 42 mg/dL — ABNORMAL HIGH (ref 5–40)

## 2024-05-14 LAB — TSH: TSH: 0.837 u[IU]/mL (ref 0.450–4.500)

## 2024-05-14 LAB — VITAMIN D 25 HYDROXY (VIT D DEFICIENCY, FRACTURES): Vit D, 25-Hydroxy: 57.2 ng/mL (ref 30.0–100.0)

## 2024-05-14 LAB — B12 AND FOLATE PANEL
Folate: 17.4 ng/mL (ref >3.0)
Vitamin B-12: 597 pg/mL (ref 232–1245)

## 2024-05-15 ENCOUNTER — Ambulatory Visit (INDEPENDENT_AMBULATORY_CARE_PROVIDER_SITE_OTHER): Admitting: Sports Medicine

## 2024-05-15 ENCOUNTER — Ambulatory Visit: Payer: Self-pay | Admitting: Physician Assistant

## 2024-05-15 DIAGNOSIS — M5412 Radiculopathy, cervical region: Secondary | ICD-10-CM | POA: Diagnosis not present

## 2024-05-15 DIAGNOSIS — S62650D Nondisplaced fracture of medial phalanx of right index finger, subsequent encounter for fracture with routine healing: Secondary | ICD-10-CM | POA: Diagnosis not present

## 2024-05-15 NOTE — Progress Notes (Signed)
 Tomoko,   Kidney, liver, glucose look good.  Vitamin D  looks GREAT. Stay on same daily dose.  Iron studies look great! Vitamin B and folate look GREAT!  Thyroid  normal range.  A1C in normal range and improved! Cholesterol is not to goal.   10 year cardiovascular risk is still under 7.5 percent which is good.  LDL 171.  Continue to work on weight loss and healthy diet. Recheck in 1 year.   Aaron Aas.The 10-year ASCVD risk score (Arnett DK, et al., 2019) is: 3.7%   Values used to calculate the score:     Age: 58 years     Sex: Female     Is Non-Hispanic African American: No     Diabetic: No     Tobacco smoker: No     Systolic Blood Pressure: 130 mmHg     Is BP treated: Yes     HDL Cholesterol: 57 mg/dL     Total Cholesterol: 270 mg/dL
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
## 2024-06-01 ENCOUNTER — Other Ambulatory Visit: Payer: Self-pay | Admitting: Physician Assistant

## 2024-06-01 DIAGNOSIS — F419 Anxiety disorder, unspecified: Secondary | ICD-10-CM

## 2024-06-01 DIAGNOSIS — F339 Major depressive disorder, recurrent, unspecified: Secondary | ICD-10-CM

## 2024-06-09 ENCOUNTER — Telehealth: Payer: Self-pay

## 2024-06-09 ENCOUNTER — Inpatient Hospital Stay: Admission: RE | Admit: 2024-06-09 | Source: Ambulatory Visit

## 2024-06-09 NOTE — Telephone Encounter (Signed)
 Spoke with Kayla Hood. Patient is there for evaluation at time of call. If they find they do need the splinting after the eval. They will just refer her to another [facility] that does have Occupational therapist who would be able to make the splint for the patient. She said that this would not require anything further form us .

## 2024-06-09 NOTE — Telephone Encounter (Signed)
 Copied from CRM [id number]. Topic: Clinical - Medical Advice >> Jun 09, 2024 10:36 AM Danelle Dunning F wrote: Reason for CRM:   Caller/Agency: [facility]  Callback Number: [phone]  They are calling to confirm whether the patient is still in need of a splint as they do not provide this service but are willing to see the patient for physical therapy needs.   Please provide clarification by giving Tachel a call at [phone]

## 2024-06-09 NOTE — Telephone Encounter (Signed)
 Splinting is not crucial as long as they feel as though they can still work on her range of motion.
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
## 2024-06-23 ENCOUNTER — Ambulatory Visit

## 2024-06-30 ENCOUNTER — Ambulatory Visit: Admitting: Sports Medicine

## 2024-07-10 ENCOUNTER — Ambulatory Visit (INDEPENDENT_AMBULATORY_CARE_PROVIDER_SITE_OTHER): Admitting: Sports Medicine

## 2024-07-10 ENCOUNTER — Encounter: Payer: Self-pay | Admitting: Sports Medicine

## 2024-07-10 DIAGNOSIS — S62650D Nondisplaced fracture of medial phalanx of right index finger, subsequent encounter for fracture with routine healing: Secondary | ICD-10-CM

## 2024-07-10 MED ORDER — TRIAMCINOLONE ACETONIDE 40 MG/ML IJ SUSP
20.0000 mg | Freq: Once | INTRAMUSCULAR | Status: AC
  Administered 2024-07-10: 20 mg via INTRAMUSCULAR
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
## 2024-08-21 ENCOUNTER — Ambulatory Visit: Admitting: Sports Medicine

## 2024-08-26 ENCOUNTER — Encounter: Payer: Self-pay | Admitting: Sports Medicine

## 2024-10-13 ENCOUNTER — Ambulatory Visit: Admitting: Physician Assistant

## 2024-10-13 ENCOUNTER — Encounter: Payer: Self-pay | Admitting: Physician Assistant

## 2024-10-13 VITALS — BP 138/92 | HR 90 | Ht 68.0 in | Wt 178.0 lb

## 2024-10-13 DIAGNOSIS — J0140 Acute pansinusitis, unspecified: Secondary | ICD-10-CM

## 2024-10-13 DIAGNOSIS — R599 Enlarged lymph nodes, unspecified: Secondary | ICD-10-CM

## 2024-10-13 MED ORDER — METHYLPREDNISOLONE 4 MG PO TBPK: ORAL_TABLET | ORAL | 0 refills | Status: DC

## 2024-10-13 MED ORDER — DOXYCYCLINE HYCLATE 100 MG PO TABS: 100.0000 mg | ORAL_TABLET | Freq: Two times a day (BID) | ORAL | 0 refills | Status: DC

## 2024-10-13 NOTE — Patient Instructions (Signed)
 Doxycycline  and medrol  dose pack sent to pharmacy  Sinus Infection, Adult A sinus infection, also called sinusitis, is inflammation of your sinuses. Sinuses are hollow spaces in the bones around your face. Your sinuses are located: Around your eyes. In the middle of your forehead. Behind your nose. In your cheekbones. Mucus normally drains out of your sinuses. When your nasal tissues become inflamed or swollen, mucus can become trapped or blocked. This allows bacteria, viruses, and fungi to grow, which leads to infection. Most infections of the sinuses are caused by a virus. A sinus infection can develop quickly. It can last for up to 4 weeks (acute) or for more than 12 weeks (chronic). A sinus infection often develops after a cold. What are the causes? This condition is caused by anything that creates swelling in the sinuses or stops mucus from draining. This includes: Allergies. Asthma. Infection from bacteria or viruses. Deformities or blockages in your nose or sinuses. Abnormal growths in the nose (nasal polyps). Pollutants, such as chemicals or irritants in the air. Infection from fungi. This is rare. What increases the risk? You are more likely to develop this condition if you: Have a weak body defense system (immune system). Do a lot of swimming or diving. Overuse nasal sprays. Smoke. What are the signs or symptoms? The main symptoms of this condition are pain and a feeling of pressure around the affected sinuses. Other symptoms include: Stuffy nose or congestion that makes it difficult to breathe through your nose. Thick yellow or greenish drainage from your nose. Tenderness, swelling, and warmth over the affected sinuses. A cough that may get worse at night. Decreased sense of smell and taste. Extra mucus that collects in the throat or the back of the nose (postnasal drip) causing a sore throat or bad breath. Tiredness (fatigue). Fever. How is this diagnosed? This  condition is diagnosed based on: Your symptoms. Your medical history. A physical exam. Tests to find out if your condition is acute or chronic. This may include: Checking your nose for nasal polyps. Viewing your sinuses using a device that has a light (endoscope). Testing for allergies or bacteria. Imaging tests, such as an MRI or CT scan. In rare cases, a bone biopsy may be done to rule out more serious types of fungal sinus disease. How is this treated? Treatment for a sinus infection depends on the cause and whether your condition is chronic or acute. If caused by a virus, your symptoms should go away on their own within 10 days. You may be given medicines to relieve symptoms. They include: Medicines that shrink swollen nasal passages (decongestants). A spray that eases inflammation of the nostrils (topical intranasal corticosteroids). Rinses that help get rid of thick mucus in your nose (nasal saline washes). Medicines that treat allergies (antihistamines). Over-the-counter pain relievers. If caused by bacteria, your health care provider may recommend waiting to see if your symptoms improve. Most bacterial infections will get better without antibiotic medicine. You may be given antibiotics if you have: A severe infection. A weak immune system. If caused by narrow nasal passages or nasal polyps, surgery may be needed. Follow these instructions at home: Medicines Take, use, or apply over-the-counter and prescription medicines only as told by your health care provider. These may include nasal sprays. If you were prescribed an antibiotic medicine, take it as told by your health care provider. Do not stop taking the antibiotic even if you start to feel better. Hydrate and humidify  Drink enough fluid to  keep your urine pale yellow. Staying hydrated will help to thin your mucus. Use a cool mist humidifier to keep the humidity level in your home above 50%. Inhale steam for 10-15 minutes,  3-4 times a day, or as told by your health care provider. You can do this in the bathroom while a hot shower is running. Limit your exposure to cool or dry air. Rest Rest as much as possible. Sleep with your head raised (elevated). Make sure you get enough sleep each night. General instructions  Apply a warm, moist washcloth to your face 3-4 times a day or as told by your health care provider. This will help with discomfort. Use nasal saline washes as often as told by your health care provider. Wash your hands often with soap and water to reduce your exposure to germs. If soap and water are not available, use hand sanitizer. Do not smoke. Avoid being around people who are smoking (secondhand smoke). Keep all follow-up visits. This is important. Contact a health care provider if: You have a fever. Your symptoms get worse. Your symptoms do not improve within 10 days. Get help right away if: You have a severe headache. You have persistent vomiting. You have severe pain or swelling around your face or eyes. You have vision problems. You develop confusion. Your neck is stiff. You have trouble breathing. These symptoms may be an emergency. Get help right away. Call 911. Do not wait to see if the symptoms will go away. Do not drive yourself to the hospital. Summary A sinus infection is soreness and inflammation of your sinuses. Sinuses are hollow spaces in the bones around your face. This condition is caused by nasal tissues that become inflamed or swollen. The swelling traps or blocks the flow of mucus. This allows bacteria, viruses, and fungi to grow, which leads to infection. If you were prescribed an antibiotic medicine, take it as told by your health care provider. Do not stop taking the antibiotic even if you start to feel better. Keep all follow-up visits. This is important. This information is not intended to replace advice given to you by your health care provider. Make sure you  discuss any questions you have with your health care provider. Document Revised: 11/15/2021 Document Reviewed: 11/15/2021 Elsevier Patient Education  2024 [employer].

## 2024-10-13 NOTE — Progress Notes (Deleted)
   Acute Office Visit  Subjective:     Patient ID: Kayla Hood, female    DOB: 10-31-1966, 58 y.o.   MRN: [id number]  Chief Complaint  Patient presents with   Sinus Problem    HPI Discussed the use of AI scribe software for clinical note transcription with the patient, who gave verbal consent to proceed.  History of Present Illness     ROS See HPI.      Objective:    BP (!) 138/92   Pulse 90   Ht 5' 8 (1.727 m)   Wt 178 lb (80.7 kg)   SpO2 99%   BMI 27.06 kg/m  BP Readings from Last 3 Encounters:  10/13/24 (!) 138/92  05/13/24 130/82  03/14/24 (!) 170/85   Wt Readings from Last 3 Encounters:  10/13/24 178 lb (80.7 kg)  05/13/24 174 lb (78.9 kg)  03/10/24 176 lb 4 oz (79.9 kg)      Physical Exam  No results found for any visits on 10/13/24.      Assessment & Plan:  .Kayla Hood was seen today for sinus problem.  Diagnoses and all orders for this visit:  Subacute pansinusitis  Lymph node enlargement  Other orders -     doxycycline  (VIBRA -TABS) 100 MG tablet; Take 1 tablet (100 mg total) by mouth 2 (two) times daily. -     methylPREDNISolone  (MEDROL  DOSEPAK) 4 MG TBPK tablet; Take as directed by package insert.    No follow-ups on file.  Joziyah Roblero, PA-C

## 2024-10-13 NOTE — Progress Notes (Signed)
 Acute Office Visit  Subjective:     Patient ID: Kayla Hood, female    DOB: [birth year], 58 y.o.   MRN: [id number]  Chief Complaint  Patient presents with   Sinus Problem   Patient is a 58 yo female presenting with a complaint of submandibular swollen lymph nodes and eye congestion. Patient was recently treated with Augmentin  at [facility] for sialadenitis and has completed her medication. She states the medication made her condition better and then became worse again. She describes the lymph nodes as enlarged and tender to touch. She also states to have some eye congestion and leakage since finishing treatment at [facility]. No fever, cough, dyspnea, or sore throat. She endorses sinus pain, rhinorrhea, and generalized malaise.    Review of Systems  Constitutional:  Positive for malaise/fatigue. Negative for fever.  HENT:  Positive for sinus pain. Negative for sore throat.   Eyes:  Positive for discharge and redness. Negative for pain.  Respiratory:  Negative for cough, sputum production and shortness of breath.   All other systems reviewed and are negative.       Objective:    BP (!) 138/92   Pulse 90   Ht 5' 8 (1.727 m)   Wt 178 lb (80.7 kg)   SpO2 99%   BMI 27.06 kg/m  BP Readings from Last 3 Encounters:  10/13/24 (!) 138/92  05/13/24 130/82  03/14/24 (!) 170/85   Wt Readings from Last 3 Encounters:  10/13/24 178 lb (80.7 kg)  05/13/24 174 lb (78.9 kg)  03/10/24 176 lb 4 oz (79.9 kg)      Physical Exam Constitutional:      Appearance: Normal appearance. She is normal weight.  HENT:     Head: Normocephalic and atraumatic.     Right Ear: Tympanic membrane, ear canal and external ear normal.     Left Ear: Tympanic membrane, ear canal and external ear normal.     Nose:     Right Turbinates: Enlarged and swollen.     Comments: Left sided turbinates are erythematous.     Mouth/Throat:     Pharynx: Uvula midline. Posterior oropharyngeal erythema and postnasal drip present.      Tonsils: No tonsillar exudate or tonsillar abscesses.  Neck:     Comments: Submandibular lymphadenopathy present.Lymph nodes are enlarged, slightly tender, mobile, and not rigid.  Lymphadenopathy:     Cervical: Cervical adenopathy present.  Neurological:     Mental Status: She is alert.          Assessment & Plan:  [name] was seen today for sinus problem.  Diagnoses and all orders for this visit:  Subacute pansinusitis -     doxycycline  (VIBRA -TABS) 100 MG tablet; Take 1 tablet (100 mg total) by mouth 2 (two) times daily. -     methylPREDNISolone  (MEDROL  DOSEPAK) 4 MG TBPK tablet; Take as directed by package insert.  Lymph node enlargement -     doxycycline  (VIBRA -TABS) 100 MG tablet; Take 1 tablet (100 mg total) by mouth 2 (two) times daily. -     methylPREDNISolone  (MEDROL  DOSEPAK) 4 MG TBPK tablet; Take as directed by package insert.    Patient was recently treated for sialadenitis with Augmentin  on 8/27 but did not have total relief of symptoms.   High suspicion for lingering subacute sinusitis with possible penicillin-resistant bacteria; residual inflammation also noted.   Switched antibiotic to Doxycycline  for better coverage against sinusitis.  Start Medrol  Dose Pack to help reduce inflammation.   If  symptoms do not resolve in 3 weeks, will consider advanced imaging of sinus tract..      Haylynn Pha, PA-C

## 2024-11-12 ENCOUNTER — Ambulatory Visit: Admitting: Physician Assistant

## 2024-11-12 ENCOUNTER — Encounter: Payer: Self-pay | Admitting: Physician Assistant

## 2024-11-12 VITALS — BP 140/90 | HR 89 | Ht 68.0 in | Wt 179.0 lb

## 2024-11-12 DIAGNOSIS — M8589 Other specified disorders of bone density and structure, multiple sites: Secondary | ICD-10-CM

## 2024-11-12 DIAGNOSIS — G8929 Other chronic pain: Secondary | ICD-10-CM

## 2024-11-12 DIAGNOSIS — F4310 Post-traumatic stress disorder, unspecified: Secondary | ICD-10-CM | POA: Insufficient documentation

## 2024-11-12 DIAGNOSIS — I10 Essential (primary) hypertension: Secondary | ICD-10-CM

## 2024-11-12 DIAGNOSIS — Z1231 Encounter for screening mammogram for malignant neoplasm of breast: Secondary | ICD-10-CM

## 2024-11-12 DIAGNOSIS — F5101 Primary insomnia: Secondary | ICD-10-CM | POA: Diagnosis not present

## 2024-11-12 DIAGNOSIS — F339 Major depressive disorder, recurrent, unspecified: Secondary | ICD-10-CM | POA: Diagnosis not present

## 2024-11-12 DIAGNOSIS — F419 Anxiety disorder, unspecified: Secondary | ICD-10-CM

## 2024-11-12 DIAGNOSIS — F410 Panic disorder [episodic paroxysmal anxiety] without agoraphobia: Secondary | ICD-10-CM

## 2024-11-12 DIAGNOSIS — Z853 Personal history of malignant neoplasm of breast: Secondary | ICD-10-CM | POA: Insufficient documentation

## 2024-11-12 MED ORDER — CLONAZEPAM 0.5 MG PO TABS: ORAL_TABLET | ORAL | 1 refills | Status: AC

## 2024-11-12 MED ORDER — ESCITALOPRAM OXALATE 10 MG PO TABS: 10.0000 mg | ORAL_TABLET | Freq: Every day | ORAL | 1 refills | Status: AC

## 2024-11-12 MED ORDER — AMLODIPINE BESYLATE 5 MG PO TABS: 5.0000 mg | ORAL_TABLET | Freq: Every day | ORAL | 1 refills | Status: AC

## 2024-11-12 MED ORDER — ZOLPIDEM TARTRATE 10 MG PO TABS: ORAL_TABLET | ORAL | 1 refills | Status: AC
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
## 2024-11-12 NOTE — Patient Instructions (Signed)
 Trial of klonapin before long trips as passenger Bone density and mammogram ordered.   Managing Post-Traumatic Stress Disorder If you have been diagnosed with post-traumatic stress disorder (PTSD), you may be relieved that you now know why you have felt or behaved a certain way. Still, you may feel overwhelmed and concerned for your future.  With the right treatment and support, you can live your life with fewer symptoms. What actions can I take to manage PTSD? Manage stress Stress is your body's reaction to life changes and events, both good and bad. Stress can make PTSD worse. Take the following steps to manage stress: Talk with your health care provider or a counselor about ways to lower your stress. These may include: Physical exercise. Meditation, yoga, or other mind-body exercises. Exercises to relax your muscles. Breathing exercises. Listening to quiet music. Spending time outside. Eat a healthy diet. Get plenty of sleep. Take time to relax. Spend time with others. Talk with them about how you are feeling and what you need. Do activities that you enjoy. Have a schedule and take regular breaks. Take your medicines Symptoms of PTSD can make you feel depressed and anxious. They can also disrupt reality. Medicines can help lessen symptoms and make you feel better. It is important to: Talk with your pharmacist or health care provider about all medicines you take. Talk about the side effects. Discuss which medicines are safe to take together. Be involved in your care. Decide on the best treatment plan with your health care provider. Be aware that if you decide to take medicine, it can take 4-8 weeks before you notice a difference in your symptoms. Talk to your health care provider before stopping medicines. Stay close to family and friends PTSD can affect relationships. Make an effort to: Trust your friends and loved ones. Trusting others can help you feel safe and connect you with  emotional support. Be open and honest about your feelings. Have fun and relax in safe spaces, such as with friends and family. Think about going to couples counseling, family education classes, or family therapy. Therapy can be helpful for everyone. Talk with friends and family about how they can best support you. What are signs that my condition is getting worse? Be aware of your symptoms and how often you have them. The following symptoms mean that you may need additional help: You feel suspicious and angry. You have repeated flashbacks. You avoid going out or being with others. You have more fights with close friends or family members. You have thoughts about hurting yourself or others. You cannot get relief from feelings of depression or anxiety. Follow these instructions at home: Lifestyle Exercise at least 30 minutes a few times a week. Try to get 7-9 hours of sleep each night. To help with sleep: Keep your bedroom cool and dark. Do not eat a heavy meal within 1 hour of bedtime. Avoid caffeine for at least 8 hours before bed. Avoid screen time before bed. This includes television, computers, tablets, and mobile phones. Try to have fun and find humor in your life. Eat a healthy diet. Write your thoughts and feelings down in a journal or on a tablet or mobile phone. General instructions Take over-the-counter and prescription medicines only as told by your health care provider. Do not useillegal drugs. Know and remember that healing from trauma takes time. Keep your health care team up to date about your PTSD symptoms and treatment. Alcohol use Do not drink alcohol if: Your health  care provider tells you not to drink. You are pregnant, may be pregnant, or are planning to become pregnant. If you drink alcohol: Limit how much you have to: 0-1 drink a day for women. 0-2 drinks a day for men. Know how much alcohol is in your drink. In the [place] one drink equals one 12 oz bottle of  beer (355 mL), one 5 oz glass of wine (148 mL), or one 1 oz glass of hard liquor (44 mL). Where to find more information For more information about PTSD, treatment, and how to get support, go to: [employer]: bloggercourse.com National Center for PTSD: ptsd.fitboxer.tn Contact a health care provider if: Your symptoms get worse or do not get better. You have trouble doing daily tasks. You have loss of appetite. You have trouble sleeping. Get help right away if: You have thoughts about hurting yourself or others. Get help right away if you feel like you may hurt yourself or others, or have thoughts about taking your own life. Go to your nearest emergency room or: Call 911. Call the National Suicide Prevention Lifeline at (209) 253-5323 or 988. This is open 24 hours a day. Text the Crisis Text Line at (413)777-9629. Summary With the right treatment and support, you can live your life with fewer symptoms. Find supportive people and settings. Spend time in those places. Keep in contact with those people. Work with your health care team to create a plan to manage the symptoms of PTSD. The plan should include counseling, good habits, and techniques to reduce stress. This information is not intended to replace advice given to you by your health care provider. Make sure you discuss any questions you have with your health care provider. Document Revised: 03/31/2022 Document Reviewed: 03/31/2022 Elsevier Patient Education  2024 [employer].

## 2024-11-25 ENCOUNTER — Ambulatory Visit: Admitting: Physician Assistant

## 2024-11-27 ENCOUNTER — Ambulatory Visit

## 2024-12-08 ENCOUNTER — Ambulatory Visit: Payer: Self-pay | Admitting: Physician Assistant

## 2024-12-08 NOTE — Progress Notes (Signed)
 Normal mammogram. Follow up in 1 year.

## 2025-01-05 ENCOUNTER — Encounter: Payer: Self-pay | Admitting: Physician Assistant

## 2025-01-05 ENCOUNTER — Ambulatory Visit

## 2025-01-05 ENCOUNTER — Ambulatory Visit: Admitting: Physician Assistant

## 2025-01-05 VITALS — BP 138/86 | HR 81 | Ht 67.0 in | Wt 180.0 lb

## 2025-01-05 DIAGNOSIS — R3121 Asymptomatic microscopic hematuria: Secondary | ICD-10-CM | POA: Insufficient documentation

## 2025-01-05 DIAGNOSIS — M549 Dorsalgia, unspecified: Secondary | ICD-10-CM

## 2025-01-05 LAB — POCT URINALYSIS DIP (CLINITEK)
Bilirubin, UA: NEGATIVE
Glucose, UA: NEGATIVE mg/dL
Ketones, POC UA: NEGATIVE mg/dL
Leukocytes, UA: NEGATIVE
Nitrite, UA: NEGATIVE
POC PROTEIN,UA: NEGATIVE
Spec Grav, UA: 1.02
Urobilinogen, UA: 0.2 U/dL
pH, UA: 6

## 2025-01-05 MED ORDER — TRAMADOL HCL 50 MG PO TABS: 50.0000 mg | ORAL_TABLET | Freq: Four times a day (QID) | ORAL | 0 refills | Status: AC | PRN

## 2025-01-05 NOTE — Patient Instructions (Signed)
 Tens unit 15 minutes at a time with heating pad.  Icy hot patches with lidocaine .  Continue massage therapy.  Ibuprofen 800mg  up to three times a day.    Thoracic Strain Rehab Ask your health care provider which exercises are safe for you. Do exercises exactly as told by your provider and adjust them as directed. It is normal to feel mild stretching, pulling, tightness, or discomfort as you do these exercises. Stop right away if you feel sudden pain or your pain gets worse. Do not begin these exercises until told by your provider. Stretching and range-of-motion exercise This exercise warms up your muscles and joints and improves the movement and flexibility of your back and shoulders. This exercise also helps to relieve pain. Chest and spine stretch  Lie down on your back on a firm surface. Roll a towel or a small blanket so it is about 4 inches (10 cm) in diameter. Put the towel under the middle of your back so it is under your spine, but not under your shoulder blades. Put your hands behind your head and let your elbows fall to your sides. This will increase your stretch. Take a deep breath (inhale). Hold for __________ seconds. Relax after you breathe out (exhale). Repeat __________ times. Complete this exercise __________ times a day. Strengthening exercises These exercises build strength and endurance in your back and your shoulder blade muscles. Endurance is the ability to use your muscles for a long time, even after they get tired. Alternating arm and leg raises  Get on your hands and knees on a firm surface. If you are on a hard floor, you may want to use padding, such as an exercise mat, to cushion your knees. Line up your arms and legs. Your hands should be directly below your shoulders, and your knees should be directly below your hips. Lift your left leg behind you. At the same time, raise your right arm and straighten it in front of you. Do not lift your leg higher than your  hip. Do not lift your arm higher than your shoulder. Keep your abdominal and back muscles tight. Keep your hips facing the ground. Do not arch your back. Carefully stay balanced. Do not hold your breath. Hold for __________ seconds. Slowly return to the starting position and repeat with your right leg and your left arm. Repeat __________ times. Complete this exercise __________ times a day. Straight arm rows This exercise is also called the shoulder extension exercise. Stand with your feet shoulder width apart. Secure an exercise band to a stable object in front of you so the band is at or above shoulder height. Hold one end of the exercise band in each hand. Straighten your elbows and lift your hands up to shoulder height. Step back, away from the secured end of the exercise band, until the band stretches. Squeeze your shoulder blades together and pull your hands down to the sides of your thighs. Stop when your hands are straight down by your sides. This is shoulder extension. Do not let your hands go behind your body. Hold for __________ seconds. Slowly return to the starting position. Repeat __________ times. Complete this exercise __________ times a day. Rowing scapular retraction This is an exercise in which the shoulder blades (scapulae) are pulled toward each other (retraction). Sit in a stable chair without armrests, or stand up. Secure an exercise band to a stable object in front of you so the band is at shoulder height. Hold one end  of the exercise band in each hand. Your palms should face toward each other. Bring your arms out straight in front of you. Step back, away from the secured end of the exercise band, until the band stretches. Pull the band backward. As you do this, bend your elbows and squeeze your shoulder blades together, but avoid letting the rest of your body move. Do not shrug your shoulders upward while you do this. Stop when your elbows are at your sides or  slightly behind your body. Hold for __________ seconds. Slowly straighten your arms to return to the starting position. Repeat __________ times. Complete this exercise __________ times a day. Posture and body mechanics Good posture and healthy body mechanics can help to relieve stress in your body's tissues and joints. Body mechanics refers to the movements and positions of your body while you do your daily activities. Posture is part of body mechanics. Good posture means: Your spine is in its natural S-curve position (neutral). Your shoulders are pulled back slightly. Your head is not tipped forward. Follow these guidelines to improve your posture and body mechanics in your everyday activities. Standing  When standing, keep your spine neutral and your feet about hip width apart. Keep a slight bend in your knees. Your ears, shoulders, and hips should line up with each other. When you do a task in which you lean forward while standing in one place for a long time, place one foot up on a stable object that is 2-4 inches (5-10 cm) high, such as a footstool. This helps keep your spine neutral. Sitting  When sitting, keep your spine neutral and keep your feet flat on the floor. Use a footrest if needed. Keep your thighs parallel to the floor. Avoid rounding your shoulders, and avoid tilting your head forward. When working at a desk or a computer, keep your desk at a height where your hands are slightly lower than your elbows. Slide your chair under your desk so you are close enough to maintain good posture. When working at a computer, place your monitor at a height where you are looking straight ahead and you do not have to tilt your head forward or downward to look at the screen. Resting When lying down and resting, avoid positions that are most painful for you. If you have pain with activities such as sitting, bending, stooping, or squatting (flexion-basedactivities), lie in a position in which  your body does not bend very much. For example, avoid curling up on your side with your arms and knees near your chest (fetal position). If you have pain with activities such as standing for a long time or reaching with your arms (extension-basedactivities), lie with your spine in a neutral position and bend your knees slightly. Try the following positions: Lie on your side with a pillow between your knees. Lie on your back with a pillow under your knees.  Lifting  When lifting objects, keep your feet at least shoulder width apart and tighten your abdominal muscles. Bend your knees and hips and keep your spine neutral. It is important to lift using the strength of your legs, not your back. Do not lock your knees straight out. Always ask for help to lift heavy or awkward objects. This information is not intended to replace advice given to you by your health care provider. Make sure you discuss any questions you have with your health care provider. Document Revised: 05/25/2023 Document Reviewed: 07/31/2022 Elsevier Patient Education  2024 [employer].

## 2025-01-05 NOTE — Progress Notes (Unsigned)
 "  Acute Office Visit  Subjective:     Patient ID: Kayla Hood, female    DOB: 1966/10/04, 59 y.o.   MRN: [id number]  Chief Complaint  Patient presents with   Back Pain    HPI Discussed the use of AI scribe software for clinical note transcription with the patient, who gave verbal consent to proceed.  History of Present Illness Kayla Hood is a 59 year old female with lumbar and cervical degenerative disc disease who presents with mid back pain.  Left sided mid back pain - Back pain for the past two weeks with notable increase in severity over the weekend, slight improvement today - Pain localized along the spine, primarily on the left side - Pain intensifies with pressure, such as when lying down or sitting back - No radiation of pain - No numbness or tingling - Movement provides slight relief; sitting or lying down exacerbates discomfort - No recent changes in physical activity - No trauma or injury - Massage therapy, which usually provides relief, was ineffective this time  Pain management - Uses heating pad for relief - Prefers ibuprofen for pain management - Has not used muscle relaxers  Urinary - No urinary symptoms     ROS See HPI.     Objective:    BP 138/86   Pulse 81   Ht 5' 7 (1.702 m)   Wt 180 lb (81.6 kg)   SpO2 99%   BMI 28.19 kg/m  BP Readings from Last 3 Encounters:  01/05/25 138/86  11/12/24 (!) 140/90  10/13/24 (!) 138/92   Wt Readings from Last 3 Encounters:  01/05/25 180 lb (81.6 kg)  11/12/24 179 lb (81.2 kg)  10/13/24 178 lb (80.7 kg)    .Kayla Hood Results for orders placed or performed in visit on 01/05/25  POCT URINALYSIS DIP (CLINITEK)   Collection Time: 01/05/25  1:24 PM  Result Value Ref Range   Color, UA yellow yellow   Clarity, UA clear clear   Glucose, UA negative negative mg/dL   Bilirubin, UA negative negative   Ketones, POC UA negative negative mg/dL   Spec Grav, UA 8.979 8.989 - 1.025   Blood, UA trace-intact  (A) negative   pH, UA 6.0 5.0 - 8.0   POC PROTEIN,UA negative negative, trace   Urobilinogen, UA 0.2 0.2 or 1.0 E.U./dL   Nitrite, UA Negative Negative   Leukocytes, UA Negative Negative     Physical Exam Constitutional:      Appearance: Normal appearance.  HENT:     Head: Normocephalic.  Cardiovascular:     Rate and Rhythm: Normal rate and regular rhythm.  Pulmonary:     Effort: Pulmonary effort is normal.     Breath sounds: Normal breath sounds.  Abdominal:     General: Bowel sounds are normal. There is no distension.     Palpations: Abdomen is soft. There is no mass.     Tenderness: There is no abdominal tenderness. There is no right CVA tenderness, left CVA tenderness, guarding or rebound.  Musculoskeletal:     Right lower leg: No edema.     Left lower leg: No edema.     Comments: Muscle tightness and tenderness mid back along the left paraspinal muscles NROM at waist  Neurological:     General: No focal deficit present.     Mental Status: She is alert and oriented to person, place, and time.  Psychiatric:        Mood and Affect:  Mood normal.          Assessment & Plan:  Kayla Hood was seen today for back pain.  Diagnoses and all orders for this visit:  Mid back pain on left side -     DG Thoracic Spine W/Swimmers; Future -     POCT URINALYSIS DIP (CLINITEK) -     Urine Culture -     traMADol  (ULTRAM ) 50 MG tablet; Take 1 tablet (50 mg total) by mouth every 6 (six) hours as needed for up to 5 days.  Asymptomatic microscopic hematuria -     Urine Culture    Assessment & Plan Left mid back pain Pain localized along the spine, likely musculoskeletal involving paraspinal muscles. Differential includes degenerative changes and potential kidney involvement due to hematuria. No recent trauma or new activities. Massage therapy provided limited relief. - Apply lidocaine  patches or Salonpas to affected area. - Use TENS unit with heating pad for 15 minutes. -  Consider ibuprofen for inflammation and pain. - Prescribed tramadol  for breakthrough pain. - Ordered x-ray of spine to rule out other causes, especially given cancer history. - Provided stretches for mid back pain. - Consider dry needling if pain persists.  Hematuria Trace hematuria on urinalysis with no urinary symptoms. Differential includes kidney stones or UTI, though unlikely given lack of symptoms and normal urinalysis except for trace blood. - Cultured urine to rule out infection. - Ordered x-ray to evaluate for kidney stones. - Advised to report any new urinary symptoms. - Will repeat urinalysis in a few weeks if culture is negative and no symptoms develop to recheck blood in urine.     Return if symptoms worsen or fail to improve.  Khristina Janota, PA-C   "

## 2025-01-07 ENCOUNTER — Ambulatory Visit: Payer: Self-pay | Admitting: Physician Assistant

## 2025-01-07 LAB — URINE CULTURE

## 2025-01-07 NOTE — Progress Notes (Signed)
 Kayla Hood,   No significant bacteria in urine culture. Are you feeling better?

## 2025-01-11 NOTE — Progress Notes (Signed)
 Age related changes of thoracic spine but no acute findings.

## 2025-01-14 ENCOUNTER — Ambulatory Visit

## 2025-01-14 DIAGNOSIS — M8589 Other specified disorders of bone density and structure, multiple sites: Secondary | ICD-10-CM

## 2025-01-14 NOTE — Progress Notes (Signed)
 Kayla Hood,   Osteopenia, low bone mass, noted. Continue vitamin D /Calcium saralyn training. Recheck in 5 years.

## 2025-05-13 ENCOUNTER — Ambulatory Visit: Admitting: Physician Assistant
